# Patient Record
Sex: Female | Born: 1960
Health system: Southern US, Community
[De-identification: ages and names within clinical notes are randomized; demographics above are authoritative.]

## PROBLEM LIST (undated history)

## (undated) DIAGNOSIS — E7800 Pure hypercholesterolemia, unspecified: Secondary | ICD-10-CM

## (undated) DIAGNOSIS — D259 Leiomyoma of uterus, unspecified: Secondary | ICD-10-CM

## (undated) DIAGNOSIS — N750 Cyst of Bartholin's gland: Secondary | ICD-10-CM

## (undated) DIAGNOSIS — N159 Renal tubulo-interstitial disease, unspecified: Secondary | ICD-10-CM

## (undated) DIAGNOSIS — K219 Gastro-esophageal reflux disease without esophagitis: Secondary | ICD-10-CM

## (undated) DIAGNOSIS — G43909 Migraine, unspecified, not intractable, without status migrainosus: Secondary | ICD-10-CM

## (undated) DIAGNOSIS — M4800 Spinal stenosis, site unspecified: Secondary | ICD-10-CM

## (undated) DIAGNOSIS — T8859XA Other complications of anesthesia, initial encounter: Secondary | ICD-10-CM

## (undated) DIAGNOSIS — R569 Unspecified convulsions: Secondary | ICD-10-CM

## (undated) DIAGNOSIS — N200 Calculus of kidney: Secondary | ICD-10-CM

## (undated) DIAGNOSIS — E785 Hyperlipidemia, unspecified: Secondary | ICD-10-CM

## (undated) DIAGNOSIS — I10 Essential (primary) hypertension: Secondary | ICD-10-CM

## (undated) HISTORY — PX: EYE SURGERY: SHX253

## (undated) HISTORY — DX: Spinal stenosis, site unspecified: M48.00

## (undated) HISTORY — DX: Migraine, unspecified, not intractable, without status migrainosus: G43.909

## (undated) HISTORY — DX: Leiomyoma of uterus, unspecified: D25.9

## (undated) HISTORY — PX: ABDOMINAL HYSTERECTOMY: SHX81

## (undated) HISTORY — DX: Cyst of Bartholin's gland: N75.0

## (undated) HISTORY — DX: Hyperlipidemia, unspecified: E78.5

## (undated) HISTORY — PX: LITHOTRIPSY: SUR834

## (undated) HISTORY — DX: Renal tubulo-interstitial disease, unspecified: N15.9

## (undated) HISTORY — PX: OTHER SURGICAL HISTORY: SHX169

## (undated) HISTORY — DX: Calculus of kidney: N20.0

## (undated) HISTORY — PX: KNEE ARTHROSCOPY: SHX127

---

## 2000-02-07 ENCOUNTER — Encounter: Payer: Self-pay | Admitting: Emergency Medicine

## 2000-02-07 ENCOUNTER — Observation Stay (HOSPITAL_COMMUNITY): Admission: EM | Admit: 2000-02-07 | Discharge: 2000-02-08 | Payer: Self-pay | Admitting: Emergency Medicine

## 2000-02-07 ENCOUNTER — Encounter: Payer: Self-pay | Admitting: Otolaryngology

## 2000-04-09 ENCOUNTER — Encounter: Admission: RE | Admit: 2000-04-09 | Discharge: 2000-07-08 | Payer: Self-pay | Admitting: Otolaryngology

## 2000-10-22 ENCOUNTER — Ambulatory Visit (HOSPITAL_BASED_OUTPATIENT_CLINIC_OR_DEPARTMENT_OTHER): Admission: RE | Admit: 2000-10-22 | Discharge: 2000-10-22 | Payer: Self-pay | Admitting: Orthopedic Surgery

## 2001-07-20 ENCOUNTER — Emergency Department (HOSPITAL_COMMUNITY): Admission: EM | Admit: 2001-07-20 | Discharge: 2001-07-20 | Payer: Self-pay | Admitting: *Deleted

## 2001-08-05 ENCOUNTER — Encounter: Admission: RE | Admit: 2001-08-05 | Discharge: 2001-08-28 | Payer: Self-pay | Admitting: Orthopedic Surgery

## 2002-01-21 ENCOUNTER — Ambulatory Visit (HOSPITAL_COMMUNITY): Admission: RE | Admit: 2002-01-21 | Discharge: 2002-01-21 | Payer: Self-pay | Admitting: Family Medicine

## 2002-01-21 ENCOUNTER — Encounter: Payer: Self-pay | Admitting: Family Medicine

## 2004-05-23 ENCOUNTER — Ambulatory Visit (HOSPITAL_COMMUNITY): Admission: RE | Admit: 2004-05-23 | Discharge: 2004-05-23 | Payer: Self-pay | Admitting: Occupational Therapy

## 2004-08-01 ENCOUNTER — Ambulatory Visit: Payer: Self-pay | Admitting: *Deleted

## 2004-08-01 ENCOUNTER — Ambulatory Visit: Payer: Self-pay | Admitting: Family Medicine

## 2004-09-10 ENCOUNTER — Ambulatory Visit: Payer: Self-pay | Admitting: Family Medicine

## 2004-12-18 ENCOUNTER — Emergency Department (HOSPITAL_COMMUNITY): Admission: EM | Admit: 2004-12-18 | Discharge: 2004-12-18 | Payer: Self-pay | Admitting: Emergency Medicine

## 2004-12-21 ENCOUNTER — Ambulatory Visit: Payer: Self-pay | Admitting: Family Medicine

## 2005-01-08 ENCOUNTER — Ambulatory Visit: Payer: Self-pay | Admitting: Family Medicine

## 2005-03-06 ENCOUNTER — Ambulatory Visit: Payer: Self-pay | Admitting: Internal Medicine

## 2005-03-11 ENCOUNTER — Ambulatory Visit: Payer: Self-pay | Admitting: Internal Medicine

## 2005-06-25 ENCOUNTER — Ambulatory Visit: Payer: Self-pay | Admitting: Family Medicine

## 2005-07-28 ENCOUNTER — Emergency Department (HOSPITAL_COMMUNITY): Admission: EM | Admit: 2005-07-28 | Discharge: 2005-07-28 | Payer: Self-pay | Admitting: Emergency Medicine

## 2006-08-28 ENCOUNTER — Ambulatory Visit: Payer: Self-pay | Admitting: Family Medicine

## 2006-09-04 ENCOUNTER — Ambulatory Visit: Payer: Self-pay | Admitting: Family Medicine

## 2006-11-05 ENCOUNTER — Ambulatory Visit: Payer: Self-pay | Admitting: Family Medicine

## 2007-03-11 ENCOUNTER — Ambulatory Visit: Payer: Self-pay | Admitting: Family Medicine

## 2007-07-07 ENCOUNTER — Ambulatory Visit: Payer: Self-pay | Admitting: Internal Medicine

## 2007-08-05 ENCOUNTER — Encounter (INDEPENDENT_AMBULATORY_CARE_PROVIDER_SITE_OTHER): Payer: Self-pay | Admitting: *Deleted

## 2007-08-27 ENCOUNTER — Ambulatory Visit: Payer: Self-pay | Admitting: Internal Medicine

## 2007-08-27 LAB — CONVERTED CEMR LAB
Alkaline Phosphatase: 66 units/L (ref 39–117)
BUN: 16 mg/dL (ref 6–23)
CO2: 26 meq/L (ref 19–32)
Calcium: 9.2 mg/dL (ref 8.4–10.5)
Chloride: 102 meq/L (ref 96–112)
Eosinophils Absolute: 0.2 10*3/uL (ref 0.0–0.7)
Glucose, Bld: 80 mg/dL (ref 70–99)
HCT: 40.7 % (ref 36.0–46.0)
LDL Cholesterol: 139 mg/dL — ABNORMAL HIGH (ref 0–99)
Lymphocytes Relative: 36 % (ref 12–46)
Lymphs Abs: 3.4 10*3/uL — ABNORMAL HIGH (ref 0.7–3.3)
Monocytes Absolute: 0.5 10*3/uL (ref 0.2–0.7)
Platelets: 325 10*3/uL (ref 150–400)
RBC: 4.34 M/uL (ref 3.87–5.11)
Total Bilirubin: 0.4 mg/dL (ref 0.3–1.2)
Total CHOL/HDL Ratio: 4.6
VLDL: 20 mg/dL (ref 0–40)

## 2007-09-10 ENCOUNTER — Ambulatory Visit: Payer: Self-pay | Admitting: Internal Medicine

## 2007-09-15 ENCOUNTER — Ambulatory Visit (HOSPITAL_COMMUNITY): Admission: RE | Admit: 2007-09-15 | Discharge: 2007-09-15 | Payer: Self-pay | Admitting: Family Medicine

## 2007-09-15 ENCOUNTER — Ambulatory Visit: Payer: Self-pay | Admitting: Internal Medicine

## 2007-10-02 ENCOUNTER — Ambulatory Visit: Payer: Self-pay | Admitting: Internal Medicine

## 2007-12-03 ENCOUNTER — Ambulatory Visit: Payer: Self-pay | Admitting: Family Medicine

## 2007-12-03 ENCOUNTER — Encounter (INDEPENDENT_AMBULATORY_CARE_PROVIDER_SITE_OTHER): Payer: Self-pay | Admitting: Internal Medicine

## 2007-12-03 LAB — CONVERTED CEMR LAB
AST: 22 units/L (ref 0–37)
Albumin: 3.9 g/dL (ref 3.5–5.2)
CO2: 27 meq/L (ref 19–32)
Calcium: 9.2 mg/dL (ref 8.4–10.5)
Chloride: 107 meq/L (ref 96–112)
Cholesterol: 154 mg/dL (ref 0–200)
HDL: 46 mg/dL (ref >39)
LDL Cholesterol: 92 mg/dL (ref 0–99)
Potassium: 4.1 meq/L (ref 3.5–5.3)
Sodium: 143 meq/L (ref 135–145)
Total CHOL/HDL Ratio: 3.3
Triglycerides: 80 mg/dL (ref <150)

## 2007-12-24 ENCOUNTER — Ambulatory Visit: Payer: Self-pay | Admitting: Internal Medicine

## 2008-01-08 ENCOUNTER — Ambulatory Visit: Payer: Self-pay | Admitting: Internal Medicine

## 2008-01-08 ENCOUNTER — Encounter (INDEPENDENT_AMBULATORY_CARE_PROVIDER_SITE_OTHER): Payer: Self-pay | Admitting: Family Medicine

## 2008-01-08 LAB — CONVERTED CEMR LAB
Basophils Absolute: 0 10*3/uL (ref 0.0–0.1)
Eosinophils Absolute: 0.1 10*3/uL (ref 0.0–0.7)
Eosinophils Relative: 1 % (ref 0–5)
Lymphocytes Relative: 34 % (ref 12–46)
MCHC: 31.9 g/dL (ref 30.0–36.0)
MCV: 97.3 fL (ref 78.0–100.0)
Monocytes Relative: 6 % (ref 3–12)
Neutro Abs: 3.9 10*3/uL (ref 1.7–7.7)

## 2008-03-04 ENCOUNTER — Ambulatory Visit: Payer: Self-pay | Admitting: Internal Medicine

## 2008-03-16 ENCOUNTER — Ambulatory Visit: Payer: Self-pay | Admitting: Internal Medicine

## 2008-08-01 ENCOUNTER — Ambulatory Visit: Payer: Self-pay | Admitting: Internal Medicine

## 2008-11-29 ENCOUNTER — Ambulatory Visit: Payer: Self-pay | Admitting: Family Medicine

## 2009-01-05 ENCOUNTER — Ambulatory Visit: Payer: Self-pay | Admitting: Internal Medicine

## 2009-01-05 ENCOUNTER — Encounter (INDEPENDENT_AMBULATORY_CARE_PROVIDER_SITE_OTHER): Payer: Self-pay | Admitting: Adult Health

## 2009-01-05 LAB — CONVERTED CEMR LAB
AST: 22 units/L (ref 0–37)
Albumin: 4.2 g/dL (ref 3.5–5.2)
Alkaline Phosphatase: 65 units/L (ref 39–117)
BUN: 13 mg/dL (ref 6–23)
Basophils Absolute: 0 10*3/uL (ref 0.0–0.1)
Calcium: 9.4 mg/dL (ref 8.4–10.5)
Cholesterol: 146 mg/dL (ref 0–200)
Creatinine, Ser: 0.79 mg/dL (ref 0.40–1.20)
Glucose, Bld: 94 mg/dL (ref 70–99)
HCT: 40.5 % (ref 36.0–46.0)
Helicobacter Pylori Antibody-IgG: 5.3 — ABNORMAL HIGH
Lymphocytes Relative: 36 % (ref 12–46)
Lymphs Abs: 2.2 10*3/uL (ref 0.7–4.0)
MCHC: 32.6 g/dL (ref 30.0–36.0)
MCV: 95.3 fL (ref 78.0–100.0)
Neutrophils Relative %: 57 % (ref 43–77)
Platelets: 295 10*3/uL (ref 150–400)
RDW: 14.5 % (ref 11.5–15.5)
Sodium: 140 meq/L (ref 135–145)
Total Protein: 7.4 g/dL (ref 6.0–8.3)
Triglycerides: 50 mg/dL (ref <150)

## 2009-02-06 ENCOUNTER — Encounter (INDEPENDENT_AMBULATORY_CARE_PROVIDER_SITE_OTHER): Payer: Self-pay | Admitting: Adult Health

## 2009-02-06 ENCOUNTER — Ambulatory Visit: Payer: Self-pay | Admitting: Internal Medicine

## 2009-02-08 ENCOUNTER — Ambulatory Visit (HOSPITAL_COMMUNITY): Admission: RE | Admit: 2009-02-08 | Discharge: 2009-02-08 | Payer: Self-pay | Admitting: Family Medicine

## 2009-05-10 ENCOUNTER — Ambulatory Visit: Payer: Self-pay | Admitting: Internal Medicine

## 2009-07-07 ENCOUNTER — Ambulatory Visit: Payer: Self-pay | Admitting: Internal Medicine

## 2009-07-07 ENCOUNTER — Encounter (INDEPENDENT_AMBULATORY_CARE_PROVIDER_SITE_OTHER): Payer: Self-pay | Admitting: Adult Health

## 2009-07-07 ENCOUNTER — Ambulatory Visit (HOSPITAL_COMMUNITY): Admission: RE | Admit: 2009-07-07 | Discharge: 2009-07-07 | Payer: Self-pay | Admitting: Internal Medicine

## 2009-07-07 LAB — CONVERTED CEMR LAB: Microalb, Ur: 1.63 mg/dL (ref 0.00–1.89)

## 2009-07-28 ENCOUNTER — Ambulatory Visit: Payer: Self-pay | Admitting: Internal Medicine

## 2009-07-31 ENCOUNTER — Encounter (INDEPENDENT_AMBULATORY_CARE_PROVIDER_SITE_OTHER): Payer: Self-pay | Admitting: Adult Health

## 2009-07-31 ENCOUNTER — Ambulatory Visit: Payer: Self-pay | Admitting: Internal Medicine

## 2009-07-31 LAB — CONVERTED CEMR LAB
AST: 17 units/L (ref 0–37)
Albumin: 4.1 g/dL (ref 3.5–5.2)
BUN: 10 mg/dL (ref 6–23)
Calcium: 9.6 mg/dL (ref 8.4–10.5)
Cholesterol: 173 mg/dL (ref 0–200)
HDL: 42 mg/dL (ref >39)
Potassium: 4.3 meq/L (ref 3.5–5.3)
Sodium: 140 meq/L (ref 135–145)
Total CHOL/HDL Ratio: 4.1
Triglycerides: 79 mg/dL (ref <150)
VLDL: 16 mg/dL (ref 0–40)

## 2009-08-14 ENCOUNTER — Ambulatory Visit: Payer: Self-pay | Admitting: Internal Medicine

## 2009-08-22 ENCOUNTER — Ambulatory Visit: Payer: Self-pay | Admitting: Internal Medicine

## 2009-10-23 ENCOUNTER — Ambulatory Visit: Payer: Self-pay | Admitting: Internal Medicine

## 2009-10-23 ENCOUNTER — Encounter (INDEPENDENT_AMBULATORY_CARE_PROVIDER_SITE_OTHER): Payer: Self-pay | Admitting: Adult Health

## 2009-10-23 LAB — CONVERTED CEMR LAB
ALT: 13 units/L (ref 0–35)
AST: 21 units/L (ref 0–37)
Albumin: 4.2 g/dL (ref 3.5–5.2)
Alkaline Phosphatase: 60 units/L (ref 39–117)
BUN: 13 mg/dL (ref 6–23)
Calcium: 8.8 mg/dL (ref 8.4–10.5)
Creatinine, Ser: 0.83 mg/dL (ref 0.40–1.20)
Glucose, Bld: 83 mg/dL (ref 70–99)
LDL Cholesterol: 99 mg/dL (ref 0–99)
Potassium: 4.2 meq/L (ref 3.5–5.3)
Total Bilirubin: 0.5 mg/dL (ref 0.3–1.2)
Total CHOL/HDL Ratio: 3.6
Total Protein: 7.1 g/dL (ref 6.0–8.3)

## 2010-01-10 ENCOUNTER — Ambulatory Visit: Payer: Self-pay | Admitting: Internal Medicine

## 2010-01-29 ENCOUNTER — Observation Stay (HOSPITAL_COMMUNITY): Admission: EM | Admit: 2010-01-29 | Discharge: 2010-01-29 | Payer: Self-pay | Admitting: Emergency Medicine

## 2010-01-29 ENCOUNTER — Encounter (INDEPENDENT_AMBULATORY_CARE_PROVIDER_SITE_OTHER): Payer: Self-pay | Admitting: Internal Medicine

## 2010-02-02 ENCOUNTER — Ambulatory Visit: Payer: Self-pay | Admitting: Family Medicine

## 2010-02-02 ENCOUNTER — Encounter (INDEPENDENT_AMBULATORY_CARE_PROVIDER_SITE_OTHER): Payer: Self-pay | Admitting: Adult Health

## 2010-02-04 ENCOUNTER — Emergency Department (HOSPITAL_COMMUNITY): Admission: EM | Admit: 2010-02-04 | Discharge: 2010-02-04 | Payer: Self-pay | Admitting: Emergency Medicine

## 2010-03-02 ENCOUNTER — Ambulatory Visit: Payer: Self-pay | Admitting: Internal Medicine

## 2011-02-11 LAB — GLUCOSE, CAPILLARY: Glucose-Capillary: 115 mg/dL — ABNORMAL HIGH (ref 70–99)

## 2011-02-11 LAB — COMPREHENSIVE METABOLIC PANEL
AST: 24 U/L (ref 0–37)
Albumin: 3.5 g/dL (ref 3.5–5.2)
BUN: 10 mg/dL (ref 6–23)
CO2: 27 mEq/L (ref 19–32)
Calcium: 9.1 mg/dL (ref 8.4–10.5)
Creatinine, Ser: 0.75 mg/dL (ref 0.4–1.2)
GFR calc Af Amer: >60 mL/min (ref >60)
Sodium: 138 mEq/L (ref 135–145)
Total Protein: 6.8 g/dL (ref 6.0–8.3)

## 2011-02-11 LAB — CBC
Hemoglobin: 13.2 g/dL (ref 12.0–15.0)
MCHC: 32.6 g/dL (ref 30.0–36.0)
MCHC: 32.9 g/dL (ref 30.0–36.0)
MCV: 97 fL (ref 78.0–100.0)
Platelets: 269 10*3/uL (ref 150–400)
RBC: 3.98 MIL/uL (ref 3.87–5.11)

## 2011-02-11 LAB — POCT I-STAT, CHEM 8
BUN: 13 mg/dL (ref 6–23)
Chloride: 105 mEq/L (ref 96–112)
Glucose, Bld: 101 mg/dL — ABNORMAL HIGH (ref 70–99)
TCO2: 28 mmol/L (ref 0–100)

## 2011-02-11 LAB — CARDIAC PANEL(CRET KIN+CKTOT+MB+TROPI)
Relative Index: 0.7 (ref 0.0–2.5)
Troponin I: 0.02 ng/mL (ref 0.00–0.06)

## 2011-02-11 LAB — LIPID PANEL
Cholesterol: 154 mg/dL (ref 0–200)
HDL: 45 mg/dL (ref >39)
LDL Cholesterol: 96 mg/dL (ref 0–99)
Triglycerides: 63 mg/dL (ref <150)

## 2011-02-11 LAB — DIFFERENTIAL
Basophils Relative: 0 % (ref 0–1)
Lymphocytes Relative: 26 % (ref 12–46)
Lymphs Abs: 1.9 10*3/uL (ref 0.7–4.0)
Neutrophils Relative %: 68 % (ref 43–77)

## 2011-02-11 LAB — URINALYSIS, ROUTINE W REFLEX MICROSCOPIC
Hgb urine dipstick: NEGATIVE
Nitrite: NEGATIVE
Protein, ur: NEGATIVE mg/dL
Specific Gravity, Urine: 1.024 (ref 1.005–1.030)
Urobilinogen, UA: 1 mg/dL (ref 0.0–1.0)
pH: 6 (ref 5.0–8.0)

## 2011-02-11 LAB — URINE MICROSCOPIC-ADD ON

## 2011-02-11 LAB — POCT PREGNANCY, URINE: Preg Test, Ur: NEGATIVE

## 2011-03-21 ENCOUNTER — Other Ambulatory Visit: Payer: Self-pay | Admitting: Physician Assistant

## 2011-03-21 DIAGNOSIS — Z1231 Encounter for screening mammogram for malignant neoplasm of breast: Secondary | ICD-10-CM

## 2011-03-27 ENCOUNTER — Ambulatory Visit
Admission: RE | Admit: 2011-03-27 | Discharge: 2011-03-27 | Disposition: A | Discharge status: Home / Self Care | Payer: Federal, State, Local not specified - PPO | Source: Ambulatory Visit | Attending: Physician Assistant | Admitting: Physician Assistant

## 2011-03-27 DIAGNOSIS — Z1231 Encounter for screening mammogram for malignant neoplasm of breast: Secondary | ICD-10-CM

## 2011-04-05 NOTE — Op Note (Signed)
[place]. [facility]  Patient:    Hannah Nguyen, Hannah Nguyen                      MRN: [id number] Proc. Date: 10/22/00 Adm. Date:  [id number] Attending:  Milly Jakob                           Operative Report  PREOPERATIVE DIAGNOSIS:  Medial meniscal tear.  POSTOPERATIVE DIAGNOSES: 1. Medial meniscal tear. 2. Medial patellar facet chondromalacia.  PROCEDURE: 1. Partial medial meniscectomy. 2. Debridement of medial patellar facet chondromalacia.  SURGEON:  Harvie Junior, M.D.  ASSISTANT:  Currie Paris. Thedore Mins.  ANESTHESIA:  General.  BRIEF HISTORY:  She is a 50 year old female with a long history of having had locking and catching and grabbing in the knee.  She was seen initially at [facility], where an injection in the knee had initially resolved her pain, but then it returned.  She had intermittent sharp pain.  She was sent over for evaluation.  Her examination at that time showed that she had significant medial joint line tenderness with a positive McMurray.  She had ultimately been scoped previously with no significant findings, and she had talk of conservative care.  She ultimately wanted to have something done, and she presented to the operating room for this procedure.  DESCRIPTION OF PROCEDURE:  The patient was taken to the operating room and after adequate anesthesia was obtained, the patient was placed supine on the operating table.  The left leg was prepped and draped in the usual sterile fashion.  Following this, routine arthroscopic examination of the knee revealed that there was an obvious medial meniscal tear with a midbody problem and undersurface tearing.  This was in the midportion going posteriorly.  This could be easily distracted into the knee.  It was clear that this was locking and catching on her.  At this point, this undersurface tear was debrided back to a stable rim.  Attention was turned to the anterior cruciate, which  was normal.  The lateral side was without significant abnormality.  Attention was turned to the patellofemoral joint, where there was noted to be some lateral tracking, not dramatic but certainly there, and she also had some fairly significant chondromalacia on the medial patellar facet.  This was smoothed, and then attention was turned to the tracking. It was felt that this was reasonable tracking.  It did approach the midline at 30 degrees of flexion. at this point, the knee was copiously irrigated and suctioned dry.  One final check had been made prior to ensure there was no loose fragment or pieces.  A sterile compressive dressing was applied, and the patient taken to the recovery room, where she was noted to be in satisfactory condition.  Estimated blood loss for the procedure was none. DD:  10/22/00 TD:  10/22/00 Job: [id number]

## 2011-04-05 NOTE — Consult Note (Signed)
[place]. [facility]  Patient:    Hannah Nguyen, Hannah Nguyen                      MRN: [id number] Proc. Date: 02/07/00 Adm. Date:  [id number] Disc. Date: [id number] Attending:  Fernande Boyden CC:         Trudi Ida. Denton Lank, M.D.                          Consultation Report  CHIEF COMPLAINT:  Difficulty breathing.  HISTORY OF PRESENT ILLNESS:  A 50 year old black female presented to the emergency room early this morning with significant strider and difficulty breathing.  She was evaluated by Dr. Denton Lank, and treated with a racemic epinephrine treatment, with  slight response.  Then, ENT was called in consultation for further assistance. The patient has had trouble for the last two or three hours.  No obvious antecedent  problem.  She was exposed to some cigarette smoke last evening, which is unusual for her.  She had a prior similar event three weeks ago, walking down an isle in the grocery store, with no obvious exposure at that time.  She was treated with  prednisone and medicine, she claims, for reflux, which began with C, but which he cannot name.  This was only used for one week.  She was mildly better at the conclusion of that therapy.  Beginning three weeks ago and persisting up to the  present time, she has been hoarse.  There is no pain in her throat.  There is no dysphagia or aspiration.  She does have reflux regularly, and describes it coming all the way up into her pharynx.  She does not have history of allergies or sinus infections.  There is no internal or external throat manipulation or trauma. No voice abuse.  No neurologic problems.  She had a very mild episode similar, two or three years ago.  She does have one daughter who has asthma.  She has not had any prior evaluation for this.  Today, a lateral soft tissue neck and chest film were both read as normal.  PAST MEDICAL HISTORY:  The patient has no history of neurologic  problems, diabetes, hypertension, heart attack, or stroke.  No foreign body ingestion or trauma to he throat.  She does have reflux.  She has never had any prior surgeries.  She has no known medical allergies and takes no medicines currently.  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  She does not smoke.  She takes care of three children.  FAMILY HISTORY:  She has one daughter with asthma.  REVIEW OF SYSTEMS:  Noncontributory, except as above.  PHYSICAL EXAMINATION:  GENERAL:  This is an anxious middle-aged, black female in no distress.  Her voice is quite hoarse.  She has some audible inspiratory strider, but not true expiratory wheezing.  She does look intermittently, slightly dyspneic.  She is not warm to  touch and does not look overall sick.  HEENT:  The head is atraumatic.  The neck is supple.  Mental status is acute. he hears long conversational speech.  Ear canals are clear, with aerated drums of  normal configuration.  Cranial nerves intact.  Anterior nose, there is a mild right septal deviation, with a basically healthy mucosa on both sides.  Oral cavity reveals a narrow maxilla, with some drying of her lips.  She does have significant halitosis.  The teeth are in decent repair.  Normal moist membranes. Oropharynx is clear without erythema or exudate.  Small residual tonsils.  I could not easily see nasopharynx or hypopharynx, secondary to gag.  NECK:  Without adenopathy.  No tenderness.  Normal endolaryngeal crepitus.  No thyroid enlargement or tenderness.  Following Viscous Xylocaine topical anesthesia of the nose, the flexible laryngoscope was introduced.  The nasopharynx is clear, narrow.  There is a small, midline adenoid bud.  The oropharynx is clear.  Hypopharynx reveals a juvenile epiglottis.  The vocal cords are fully mobile.  There was some slight fusiform edema of both cords.  There was paradoxical motion with adduction  during inspiration.  This is not present uniformly.  No pooling in valleculae or piriformis.  No erythema.  There was a slight prominence in the anterior tracheal vault below the cords that I could not adequately assess.  I gave the patient a 1% plain Xylocaine hand-held nebulized breathing treatment to anesthetize the endolarynx.  During this, she sustained an episode of laryngospasm, which resolved with reassurance over a matter of a few seconds.  Following this, it was possible to pass a flexible scope through the vocal cords into the trachea proper and there was no significant irregularity, no lesions, no exudate, and no erythema.  It could not get all the way into the carina before inducing cough.   IMPRESSION:  Laryngospasm, probably secondary to reflux.  Chronic laryngitis.  PLAN:  I would like to admit her for 23-hour observation, put her on Prilosec twice daily, Xanax for anxiety, check a CT scan of her sinus, and also a barium swallow. I discussed her case with Dr. Shelle Iron, who is willing to consult more formally if it is felt necessary.  I have discussed all of this with the patient, who understands and agrees. DD:  02/07/00 TD:  02/07/00 Job: [id number]

## 2011-05-06 ENCOUNTER — Ambulatory Visit
Admission: RE | Admit: 2011-05-06 | Discharge: 2011-05-06 | Disposition: A | Discharge status: Home / Self Care | Payer: Federal, State, Local not specified - PPO | Source: Ambulatory Visit | Attending: Emergency Medicine | Admitting: Emergency Medicine

## 2011-05-06 ENCOUNTER — Other Ambulatory Visit: Payer: Self-pay | Admitting: Emergency Medicine

## 2011-05-06 DIAGNOSIS — R1032 Left lower quadrant pain: Secondary | ICD-10-CM

## 2011-05-06 DIAGNOSIS — R1031 Right lower quadrant pain: Secondary | ICD-10-CM

## 2011-05-06 MED ORDER — IOHEXOL 300 MG/ML  SOLN
100.0000 mL | Freq: Once | INTRAMUSCULAR | Status: AC | PRN
  Administered 2011-05-06: 100 mL via INTRAVENOUS

## 2011-06-13 ENCOUNTER — Ambulatory Visit (HOSPITAL_COMMUNITY)
Admission: RE | Admit: 2011-06-13 | Discharge: 2011-06-13 | Disposition: A | Discharge status: Home / Self Care | Payer: Federal, State, Local not specified - PPO | Source: Ambulatory Visit | Attending: Urology | Admitting: Urology

## 2011-06-13 ENCOUNTER — Ambulatory Visit (HOSPITAL_COMMUNITY): Payer: Federal, State, Local not specified - PPO

## 2011-06-13 DIAGNOSIS — Z79899 Other long term (current) drug therapy: Secondary | ICD-10-CM | POA: Insufficient documentation

## 2011-06-13 DIAGNOSIS — I10 Essential (primary) hypertension: Secondary | ICD-10-CM | POA: Insufficient documentation

## 2011-06-13 DIAGNOSIS — Z01818 Encounter for other preprocedural examination: Secondary | ICD-10-CM | POA: Insufficient documentation

## 2011-06-13 DIAGNOSIS — N200 Calculus of kidney: Secondary | ICD-10-CM | POA: Insufficient documentation

## 2011-10-22 ENCOUNTER — Encounter: Payer: Self-pay | Admitting: Obstetrics & Gynecology

## 2011-10-22 DIAGNOSIS — N750 Cyst of Bartholin's gland: Secondary | ICD-10-CM | POA: Diagnosis present

## 2011-10-22 DIAGNOSIS — N949 Unspecified condition associated with female genital organs and menstrual cycle: Secondary | ICD-10-CM | POA: Diagnosis present

## 2011-10-22 DIAGNOSIS — D259 Leiomyoma of uterus, unspecified: Secondary | ICD-10-CM | POA: Diagnosis present

## 2011-10-22 HISTORY — DX: Cyst of Bartholin's gland: N75.0

## 2011-10-22 HISTORY — DX: Leiomyoma of uterus, unspecified: D25.9

## 2011-10-22 NOTE — H&P (Signed)
  Subjective:  Hannah Nguyen is a 50 y.o. para 2, female.  There is a history of irregular bleeding.  Onset of symptoms was gradual starting a few years ago with gradually worsening course since that time. Bleeding is characterized as moderate.   Associated symptoms include pelvic pain.  Evaluation to date: pelvic ultrasound: positive for multiple uterine fibroids. Treatment to date: depo lupron therapy  Pertinent Gyn History:  Menses flow is moderate and irregular with dysmenorrhea Bleeding: dysfunctional uterine bleeding Contraception: tubal ligation DES exposure: denies Blood transfusions: none  Last pap: normal Date: 8/12 Patient Active Problem List  Diagnoses Date Noted  . Leiomyoma of uterus, unspecified 10/22/2011  . Unspecified symptom associated with female genital organs 10/22/2011  . Bartholin's gland cyst 10/22/2011   History reviewed. No pertinent past medical history.  History reviewed. No pertinent past surgical history.  No prescriptions prior to admission   Not on File  History  Substance Use Topics  . Smoking status: Not on file  . Smokeless tobacco: Not on file  . Alcohol Use: Not on file    History reviewed. No pertinent family history.   Review of Systems Pertinent items are noted in HPI.    Objective:   Vital signs in last 24 hours:    General:   alert  Skin:   normal  HEENT:  PERRLA  Lungs:   clear to auscultation bilaterally  Heart:   regular rate and rhythm, S1, S2 normal, no murmur, click, rub or gallop  Breasts:   normal without suspicious masses, skin or nipple changes or axillary nodes  Abdomen:  soft, non-tender; bowel sounds normal; no masses,  no organomegaly  Pelvis:  External genitalia: Bartholin's mass Vaginal: normal mucosa without prolapse or lesions Cervix: normal appearance Adnexa: non palpable Uterus: enlarged and Approximately 10 weeks aggregate size                                       Assessment/Plan:  Postmenopausal with continued, irregular bleeding.   Fibroid uterus.  Bartholin's gland cyst. Attempted TRH, possible TAH, marsupialization and biopsy of the left Bartholin's gland planned.    I had a lengthy discussion with the patient regarding her bleeding and consideration for hysterectomy.  Procedure, risks, reasons, benefits and complications (including injury to bowel, bladder, major blood vessel, ureter, bleeding, possibility of transfusion, infection, or fistula formation) were reviewed in detail. Consent was signed and preop testing was ordered.  Instructions were reviewed, including NPO after midnight.

## 2011-10-23 ENCOUNTER — Encounter (HOSPITAL_COMMUNITY): Payer: Self-pay

## 2011-10-23 ENCOUNTER — Encounter (HOSPITAL_COMMUNITY): Payer: Self-pay | Admitting: Pharmacy Technician

## 2011-10-23 ENCOUNTER — Encounter (HOSPITAL_COMMUNITY)
Admission: RE | Admit: 2011-10-23 | Discharge: 2011-10-23 | Disposition: A | Discharge status: Home / Self Care | Payer: Federal, State, Local not specified - PPO | Source: Ambulatory Visit | Attending: Obstetrics & Gynecology | Admitting: Obstetrics & Gynecology

## 2011-10-23 ENCOUNTER — Other Ambulatory Visit: Payer: Self-pay

## 2011-10-23 ENCOUNTER — Ambulatory Visit (HOSPITAL_COMMUNITY)
Admission: RE | Admit: 2011-10-23 | Discharge: 2011-10-23 | Disposition: A | Discharge status: Home / Self Care | Payer: Federal, State, Local not specified - PPO | Source: Ambulatory Visit | Attending: Obstetrics & Gynecology | Admitting: Obstetrics & Gynecology

## 2011-10-23 DIAGNOSIS — Z01818 Encounter for other preprocedural examination: Secondary | ICD-10-CM | POA: Insufficient documentation

## 2011-10-23 DIAGNOSIS — Z01812 Encounter for preprocedural laboratory examination: Secondary | ICD-10-CM | POA: Insufficient documentation

## 2011-10-23 DIAGNOSIS — Z01810 Encounter for preprocedural cardiovascular examination: Secondary | ICD-10-CM | POA: Insufficient documentation

## 2011-10-23 HISTORY — DX: Gastro-esophageal reflux disease without esophagitis: K21.9

## 2011-10-23 HISTORY — DX: Pure hypercholesterolemia, unspecified: E78.00

## 2011-10-23 HISTORY — DX: Essential (primary) hypertension: I10

## 2011-10-23 HISTORY — DX: Unspecified convulsions: R56.9

## 2011-10-23 LAB — BASIC METABOLIC PANEL
BUN: 13 mg/dL (ref 6–23)
CO2: 29 mEq/L (ref 19–32)
Calcium: 10.1 mg/dL (ref 8.4–10.5)
Creatinine, Ser: 0.84 mg/dL (ref 0.50–1.10)
Glucose, Bld: 91 mg/dL (ref 70–99)
Sodium: 141 mEq/L (ref 135–145)

## 2011-10-23 LAB — CBC
HCT: 37.8 % (ref 36.0–46.0)
Hemoglobin: 12.4 g/dL (ref 12.0–15.0)
MCH: 29.5 pg (ref 26.0–34.0)
MCV: 90 fL (ref 78.0–100.0)
RBC: 4.2 MIL/uL (ref 3.87–5.11)

## 2011-10-23 LAB — SURGICAL PCR SCREEN: Staphylococcus aureus: NEGATIVE

## 2011-10-23 NOTE — Patient Instructions (Addendum)
20 Hannah Nguyen  10/23/2011   Your procedure is scheduled on:  10/25/11  Report to [facility] at 5:15 AM.  Call this number if you have problems the morning of surgery: [phone]   Remember:   Do not eat food:After Midnight.  May have clear liquids:until Midnight .  Clear liquids include soda, tea, black coffee, apple or grape juice, broth.  Take these medicines the morning of surgery with A SIP OF WATER: KEPRA   Do not wear jewelry, make-up or nail polish.  Do not wear lotions, powders, or perfumes. You may wear deodorant.  Do not shave 48 hours prior to surgery.  Do not bring valuables to the hospital.  Contacts, dentures or bridgework may not be worn into surgery.  Leave suitcase in the car. After surgery it may be brought to your room.  For patients admitted to the hospital, checkout time is 11:00 AM the day of discharge.   Patients discharged the day of surgery will not be allowed to drive home.  Name and phone number of your driver:   Special Instructions: CHG Shower Use Special Wash: 1/2 bottle night before surgery and 1/2 bottle morning of surgery.   Please read over the following fact sheets that you were given: MRSA Information

## 2011-10-25 ENCOUNTER — Other Ambulatory Visit: Payer: Self-pay | Admitting: Obstetrics & Gynecology

## 2011-10-25 ENCOUNTER — Encounter (HOSPITAL_COMMUNITY): Payer: Self-pay | Admitting: *Deleted

## 2011-10-25 ENCOUNTER — Encounter (HOSPITAL_COMMUNITY)
Admission: RE | Disposition: A | Discharge status: Home / Self Care | Payer: Self-pay | Source: Ambulatory Visit | Attending: Obstetrics & Gynecology

## 2011-10-25 ENCOUNTER — Encounter (HOSPITAL_COMMUNITY): Payer: Self-pay | Admitting: Anesthesiology

## 2011-10-25 ENCOUNTER — Ambulatory Visit (HOSPITAL_COMMUNITY)
Admission: RE | Admit: 2011-10-25 | Discharge: 2011-10-26 | DRG: 353 | Disposition: A | Discharge status: Home / Self Care | Payer: Federal, State, Local not specified - PPO | Source: Ambulatory Visit | Attending: Obstetrics & Gynecology | Admitting: Obstetrics & Gynecology

## 2011-10-25 ENCOUNTER — Encounter (HOSPITAL_COMMUNITY): Payer: Self-pay | Admitting: Obstetrics & Gynecology

## 2011-10-25 ENCOUNTER — Ambulatory Visit (HOSPITAL_COMMUNITY): Payer: Federal, State, Local not specified - PPO | Admitting: Anesthesiology

## 2011-10-25 DIAGNOSIS — N750 Cyst of Bartholin's gland: Secondary | ICD-10-CM | POA: Diagnosis present

## 2011-10-25 DIAGNOSIS — N949 Unspecified condition associated with female genital organs and menstrual cycle: Secondary | ICD-10-CM | POA: Diagnosis present

## 2011-10-25 DIAGNOSIS — I10 Essential (primary) hypertension: Secondary | ICD-10-CM | POA: Insufficient documentation

## 2011-10-25 DIAGNOSIS — K219 Gastro-esophageal reflux disease without esophagitis: Secondary | ICD-10-CM | POA: Insufficient documentation

## 2011-10-25 DIAGNOSIS — D259 Leiomyoma of uterus, unspecified: Secondary | ICD-10-CM | POA: Diagnosis present

## 2011-10-25 HISTORY — PX: ROBOTIC ASSISTED LAP VAGINAL HYSTERECTOMY: SHX2362

## 2011-10-25 LAB — TYPE AND SCREEN: Antibody Screen: NEGATIVE

## 2011-10-25 SURGERY — ROBOTIC ASSISTED LAPAROSCOPIC VAGINAL HYSTERECTOMY
Anesthesia: General | Site: Abdomen | Wound class: Clean Contaminated

## 2011-10-25 MED ORDER — ZOLPIDEM TARTRATE 5 MG PO TABS: 5.0000 mg | ORAL_TABLET | Freq: Every evening | ORAL | Status: DC | PRN

## 2011-10-25 MED ORDER — GLYCOPYRROLATE 0.2 MG/ML IJ SOLN
INTRAMUSCULAR | Status: DC | PRN
  Administered 2011-10-25: .6 mg via INTRAVENOUS

## 2011-10-25 MED ORDER — SUCCINYLCHOLINE CHLORIDE 20 MG/ML IJ SOLN
INTRAMUSCULAR | Status: DC | PRN
  Administered 2011-10-25: 100 mg via INTRAVENOUS

## 2011-10-25 MED ORDER — KCL IN DEXTROSE-NACL 20-5-0.45 MEQ/L-%-% IV SOLN
INTRAVENOUS | Status: DC
  Administered 2011-10-25 (×2): via INTRAVENOUS
  Filled 2011-10-25 (×7): qty 1000

## 2011-10-25 MED ORDER — FENTANYL CITRATE 0.05 MG/ML IJ SOLN
INTRAMUSCULAR | Status: DC | PRN
  Administered 2011-10-25 (×6): 50 ug via INTRAVENOUS

## 2011-10-25 MED ORDER — HYDROMORPHONE HCL PF 1 MG/ML IJ SOLN
INTRAMUSCULAR | Status: DC | PRN
  Administered 2011-10-25 (×3): 0.5 mg via INTRAVENOUS

## 2011-10-25 MED ORDER — ROCURONIUM BROMIDE 100 MG/10ML IV SOLN
INTRAVENOUS | Status: DC | PRN
  Administered 2011-10-25: 40 mg via INTRAVENOUS
  Administered 2011-10-25 (×2): 10 mg via INTRAVENOUS

## 2011-10-25 MED ORDER — LIDOCAINE HCL (CARDIAC) 20 MG/ML IV SOLN
INTRAVENOUS | Status: DC | PRN
  Administered 2011-10-25: 80 mg via INTRAVENOUS

## 2011-10-25 MED ORDER — LACTATED RINGERS IV SOLN: INTRAVENOUS | Status: DC

## 2011-10-25 MED ORDER — ONDANSETRON HCL 4 MG PO TABS: 4.0000 mg | ORAL_TABLET | Freq: Four times a day (QID) | ORAL | Status: DC | PRN

## 2011-10-25 MED ORDER — BUPIVACAINE HCL (PF) 0.25 % IJ SOLN
INTRAMUSCULAR | Status: DC | PRN
  Administered 2011-10-25: 12 mL

## 2011-10-25 MED ORDER — HYDROCHLOROTHIAZIDE 25 MG PO TABS
25.0000 mg | ORAL_TABLET | Freq: Every day | ORAL | Status: DC
  Administered 2011-10-25: 25 mg via ORAL
  Filled 2011-10-25 (×4): qty 1

## 2011-10-25 MED ORDER — ACETAMINOPHEN 10 MG/ML IV SOLN
INTRAVENOUS | Status: DC | PRN
  Administered 2011-10-25: 1000 mg via INTRAVENOUS

## 2011-10-25 MED ORDER — PROMETHAZINE HCL 25 MG/ML IJ SOLN
6.2500 mg | INTRAMUSCULAR | Status: DC | PRN
  Administered 2011-10-25: 6.25 mg via INTRAVENOUS

## 2011-10-25 MED ORDER — ONDANSETRON HCL 4 MG/2ML IJ SOLN
INTRAMUSCULAR | Status: DC | PRN
  Administered 2011-10-25: 4 mg via INTRAVENOUS

## 2011-10-25 MED ORDER — CEFAZOLIN SODIUM-DEXTROSE 2-3 GM-% IV SOLR
2.0000 g | Freq: Once | INTRAVENOUS | Status: AC
  Administered 2011-10-25: 2 g via INTRAVENOUS
  Filled 2011-10-25: qty 50

## 2011-10-25 MED ORDER — NEOSTIGMINE METHYLSULFATE 1 MG/ML IJ SOLN
INTRAMUSCULAR | Status: DC | PRN
  Administered 2011-10-25: 4 mg via INTRAVENOUS

## 2011-10-25 MED ORDER — MORPHINE SULFATE 2 MG/ML IJ SOLN
2.0000 mg | INTRAMUSCULAR | Status: DC | PRN
  Administered 2011-10-25 – 2011-10-26 (×3): 2 mg via INTRAVENOUS
  Filled 2011-10-25 (×3): qty 1

## 2011-10-25 MED ORDER — LABETALOL HCL 5 MG/ML IV SOLN
INTRAVENOUS | Status: DC | PRN
  Administered 2011-10-25: 2.5 mg via INTRAVENOUS

## 2011-10-25 MED ORDER — LEVETIRACETAM 500 MG PO TABS
500.0000 mg | ORAL_TABLET | Freq: Every day | ORAL | Status: DC
  Administered 2011-10-25: 500 mg via ORAL
  Filled 2011-10-25 (×4): qty 1

## 2011-10-25 MED ORDER — LACTATED RINGERS IV SOLN
INTRAVENOUS | Status: DC | PRN
  Administered 2011-10-25 (×2): via INTRAVENOUS

## 2011-10-25 MED ORDER — MEPERIDINE HCL 25 MG/ML IJ SOLN: 6.2500 mg | INTRAMUSCULAR | Status: DC | PRN

## 2011-10-25 MED ORDER — HYDROMORPHONE HCL PF 1 MG/ML IJ SOLN
0.2500 mg | INTRAMUSCULAR | Status: DC | PRN
  Administered 2011-10-25 (×2): 0.5 mg via INTRAVENOUS

## 2011-10-25 MED ORDER — LISINOPRIL 20 MG PO TABS
20.0000 mg | ORAL_TABLET | Freq: Every day | ORAL | Status: DC
  Administered 2011-10-25: 20 mg via ORAL
  Filled 2011-10-25 (×4): qty 1

## 2011-10-25 MED ORDER — LISINOPRIL-HYDROCHLOROTHIAZIDE 20-25 MG PO TABS: 1.0000 | ORAL_TABLET | Freq: Every day | ORAL | Status: DC

## 2011-10-25 MED ORDER — DEXAMETHASONE SODIUM PHOSPHATE 10 MG/ML IJ SOLN
INTRAMUSCULAR | Status: DC | PRN
  Administered 2011-10-25: 10 mg via INTRAVENOUS

## 2011-10-25 MED ORDER — OXYCODONE-ACETAMINOPHEN 5-325 MG PO TABS: 1.0000 | ORAL_TABLET | ORAL | Status: DC | PRN

## 2011-10-25 MED ORDER — PROPOFOL 10 MG/ML IV EMUL
INTRAVENOUS | Status: DC | PRN
  Administered 2011-10-25: 200 mg via INTRAVENOUS

## 2011-10-25 MED ORDER — MIDAZOLAM HCL 5 MG/5ML IJ SOLN
INTRAMUSCULAR | Status: DC | PRN
  Administered 2011-10-25: 2 mg via INTRAVENOUS

## 2011-10-25 MED ORDER — ONDANSETRON HCL 4 MG/2ML IJ SOLN
4.0000 mg | Freq: Four times a day (QID) | INTRAMUSCULAR | Status: DC | PRN
  Administered 2011-10-25: 4 mg via INTRAVENOUS
  Filled 2011-10-25: qty 2

## 2011-10-25 MED ORDER — LEVETIRACETAM 250 MG PO TABS
250.0000 mg | ORAL_TABLET | Freq: Every day | ORAL | Status: DC
  Administered 2011-10-25 – 2011-10-26 (×2): 250 mg via ORAL
  Filled 2011-10-25 (×3): qty 1

## 2011-10-25 MED ORDER — LACTATED RINGERS IR SOLN
Status: DC | PRN
  Administered 2011-10-25: 1000 mL

## 2011-10-25 SURGICAL SUPPLY — 46 items
ADH SKN CLS APL DERMABOND .7 (GAUZE/BANDAGES/DRESSINGS) ×3
CHLORAPREP W/TINT 26ML (MISCELLANEOUS) ×4 IMPLANT
CLOTH BEACON ORANGE TIMEOUT ST (SAFETY) ×4 IMPLANT
CORD HIGH FREQUENCY UNIPOLAR (ELECTROSURGICAL) ×3 IMPLANT
CORDS BIPOLAR (ELECTRODE) ×4 IMPLANT
COVER SURGICAL LIGHT HANDLE (MISCELLANEOUS) ×4 IMPLANT
COVER TIP SHEARS 8 DVNC (MISCELLANEOUS) ×3 IMPLANT
COVER TIP SHEARS 8MM DA VINCI (MISCELLANEOUS) ×1
DECANTER SPIKE VIAL GLASS SM (MISCELLANEOUS) ×1 IMPLANT
DERMABOND ADVANCED (GAUZE/BANDAGES/DRESSINGS) ×1
DERMABOND ADVANCED .7 DNX12 (GAUZE/BANDAGES/DRESSINGS) ×3 IMPLANT
DRAPE SURG IRRIG POUCH 19X23 (DRAPES) ×4 IMPLANT
DRSG TEGADERM 6X8 (GAUZE/BANDAGES/DRESSINGS) ×9 IMPLANT
ELECT REM PT RETURN 9FT ADLT (ELECTROSURGICAL) ×4
ELECTRODE REM PT RTRN 9FT ADLT (ELECTROSURGICAL) ×3 IMPLANT
FILTER SMOKE EVAC LAPAROSHD (FILTER) IMPLANT
GAUZE PACKING IODOFORM 1 (PACKING) ×3 IMPLANT
GAUZE VASELINE 3X9 (GAUZE/BANDAGES/DRESSINGS) IMPLANT
GLOVE BIO SURGEON STRL SZ 6.5 (GLOVE) ×8 IMPLANT
GLOVE BIO SURGEON STRL SZ7 (GLOVE) ×4 IMPLANT
GLOVE BIO SURGEON STRL SZ7.5 (GLOVE) ×2 IMPLANT
GLOVE BIO SURGEON STRL SZ8 (GLOVE) ×4 IMPLANT
GLOVE BIOGEL M 6.5 STRL (GLOVE) ×3 IMPLANT
HOLDER FOLEY CATH W/STRAP (MISCELLANEOUS) ×4 IMPLANT
KIT ACCESSORY DA VINCI DISP (KITS) ×1
KIT ACCESSORY DVNC DISP (KITS) ×3 IMPLANT
MANIPULATOR UTERINE 4.5 ZUMI (MISCELLANEOUS) ×3 IMPLANT
OCCLUDER COLPOPNEUMO (BALLOONS) ×4 IMPLANT
PACK ROBOTIC CUSTOM GYN (CUSTOM PROCEDURE TRAY) ×4 IMPLANT
SET TUBE IRRIG SUCTION NO TIP (IRRIGATION / IRRIGATOR) ×4 IMPLANT
SOLUTION ELECTROLUBE (MISCELLANEOUS) ×3 IMPLANT
SPONGE GAUZE 4X4 12PLY (GAUZE/BANDAGES/DRESSINGS) ×3 IMPLANT
SPONGE LAP 18X18 X RAY DECT (DISPOSABLE) IMPLANT
SUT MNCRL AB 4-0 PS2 18 (SUTURE) ×8 IMPLANT
SUT VIC AB 0 CT1 27 (SUTURE) ×12
SUT VIC AB 0 CT1 27XBRD ANTBC (SUTURE) ×9 IMPLANT
SUT VIC AB 2-0 SH 27 (SUTURE) ×12
SUT VIC AB 2-0 SH 27X BRD (SUTURE) ×6 IMPLANT
SUT VICRYL 0 UR6 27IN ABS (SUTURE) ×8 IMPLANT
SYR BULB IRRIGATION 50ML (SYRINGE) ×4 IMPLANT
SYRINGE 10CC LL (SYRINGE) ×3 IMPLANT
TROCAR 12M 150ML BLUNT (TROCAR) ×6 IMPLANT
TROCAR BLADELESS OPT 5 75 (ENDOMECHANICALS) ×7 IMPLANT
TROCAR XCEL 12X100 BLDLESS (ENDOMECHANICALS) ×3 IMPLANT
TUBING FILTER THERMOFLATOR (ELECTROSURGICAL) ×4 IMPLANT
WATER STERILE IRR 1500ML POUR (IV SOLUTION) ×8 IMPLANT

## 2011-10-25 NOTE — Op Note (Signed)
Pre-operative Diagnosis: fibroids and Left-sided Bartholin's Gland Cyst  Post-operative Diagnosis: same  Operation: Robotic-assisted hysterectomy with bilateral salpingectomy  Surgeon: Roseanna Rainbow  Assistant: Coral Ceo, MD  Anesthesia: GET  Urine Output: per Anesthesiology  Findings: Small fibroid uterus, left-sided Bartholin's gland cyst--2 cm  Estimated Blood Loss:  Minimal                 Total IV Fluids: per Anesthesiology         Specimens: PATHOLOGY               Complications:  None; patient tolerated the procedure well.         Disposition: PACU - hemodynamically stable.         Condition: Stable    Procedure Details  The patient was seen in the Holding Room. The risks, benefits, complications, treatment options, and expected outcomes were discussed with the patient.  The patient concurred with the proposed plan, giving informed consent.  The site of surgery properly noted/marked. The patient was identified as Hannah Nguyen and the procedure verified as a Robotic-assisted hysterectomy, marsupialization of the left Bartholin's gland cyst. A Time Out was held and the above information confirmed.  After induction of anesthesia, the patient was draped and prepped in the usual sterile manner. Pt was placed in supine position after anesthesia and draped and prepped in the usual sterile manner. The abdominal Nguyen was placed after the CholoraPrep had been allowed to dry for 3 minutes.  Her arms were tucked to her side with all appropriate precautions.  The shoulder blocks were placed in the usual fashion.  The patient was placed in the semi-lithotomy position in [place] stirrups.  The perineum was prepped with Betadine.  Foley catheter was placed.  A sterile speculum was placed in the vagina.  The cervix was grasped with a single-tooth tenaculum and dilated with Shawnie Pons dilators.  The ZUMI uterine manipulator with a medium colpotomizer ring was placed without  difficulty.  A pneum occluder balloon was placed over the manipulator.   OG tube placement was confirmed and to suction.  Approximately 2 cm below the costal margin, in the midclavicular line the skin was anesthestized with 0.25% Marcaine.  A 5 mm incision was made and using a 5 mm Optiview, a 5 mm trocar was placed under direct vision.  The patient's abdomen was insufflated with CO2 gas.  At this point and all points during the procedure, the patient's intra-abdominal pressure did not exceed 15 mmHg.  A 10-12 camera port was place 24 cm above the pubic symphysis.  Bilateral 8 mm ports were place 10 cm and 15 degrees inferior.  All ports were placed under direct visualization.  The 5 mm port was removed.  The incision was extended to accommodate a 10 mm trocar.  A 10 mm trocar and sleeve were advanced under direct visualization.   The robot was docked in the usual fashion.  The round ligament on the right was transected with monopolar cautery.  The anterior and posterior leaves of the broad ligament were opened.  The ureter was identified.  A window was made between the infundibulopelvic ligament and the ureter.  The fallopian tube/utero-ovarian ligament was coagulated with bipolar cautery and transected.  The uterine vessels were skeletonized to the level of the Koh ring.  A C-loop was created in the usual fashion.  A similar procedure was performed on the patient's left side. The round ligament on the left was transected with monopolar  cautery.  The anterior and posterior leaves of the broad ligament were opened.  The ureter was identified.  A window was made between the infundibulopelvic ligament and the ureter.  The fallopian tube/utero-ovarian ligament was coagulated with bipolar cautery and transected.  The uterine vessels were skeletonized to the level of the Koh ring.  A C-loop was created in the usual fashion.   The bladder flap was completed.  At this time, the pneum occluder balloon was insufflated and a  colpotomy was performed.  The uterus, cervix, bilateral tubes and ovaries were delivered through the vagina.  All pedicles were inspected under low intraabdominal pressures and noted to be hemostatic.  The vagina cuff was closed using 0-Vicryl on a CT-1 needle in a running fashion.  The abdomen and pelvis were copiously irrigated.  The needle was removed without difficulty.  The instruments were then removed under direct visualization and the robotic ports removed.  The robot was undocked.  Deep, subcutaneous, figure-of-eight 0-Vicryl sutures on a UR-6 needle were placed in the 10-12 mm supraumbilical and infracostal incisions.  All skin incisions were closed in a subcuticular fashion using 3-0 Monocryl.  Steri-strips and Benzoin were applied.  The vagina was swabbed with minimal bleeding noted.  The vaginal mucosa overlying the left Bartholin's gland was infiltrated with 0.25% Marcaine.  An elliptical incision was made in the mucosa and the mucosa was excised.  The cyst wall was incised.  A mucous-like material extruded.  The cyst was probed with a hemostat.  The cyst wall was plicated to the vaginal mucosa with interrupted 2-0 vicryl sutures.  Adequate hemostasis was noted.   All instrument and needle counts were correct x  2.

## 2011-10-25 NOTE — Preoperative (Signed)
Beta Blockers   Reason not to administer Beta Blockers:Not Applicable 

## 2011-10-25 NOTE — Interval H&P Note (Signed)
History and Physical Interval Note:  10/25/2011 7:20 AM  Isabelle D Muise  has presented today for surgery, with the diagnosis of Uterine fibroids  The various methods of treatment have been discussed with the patient and family. After consideration of risks, benefits and other options for treatment, the patient has consented to  Procedure(s): ROBOTIC ASSISTED LAPAROSCOPIC VAGINAL HYSTERECTOMY ROBOTIC ASSISTED SALPINGO OOPHERECTOMY HYSTERECTOMY ABDOMINAL as a surgical intervention .  The patients' history has been reviewed, patient examined, no change in status, stable for surgery.  I have reviewed the patients' chart and labs.  Questions were answered to the patient's satisfaction.     JACKSON-MOORE,Seona Clemenson A

## 2011-10-25 NOTE — Anesthesia Postprocedure Evaluation (Signed)
  Anesthesia Post-op Note  Patient: Hannah Nguyen  Procedure(s) Performed:  ROBOTIC ASSISTED LAPAROSCOPIC VAGINAL HYSTERECTOMY - Marsupralization og bartholin gland, cyst biopsy  Patient Location: PACU  Anesthesia Type: General  Level of Consciousness: awake and alert   Airway and Oxygen Therapy: Patient Spontanous Breathing  Post-op Pain: mild  Post-op Assessment: Post-op Vital signs reviewed, Patient's Cardiovascular Status Stable, Respiratory Function Stable, Patent Airway and No signs of Nausea or vomiting  Post-op Vital Signs: stable  Complications: No apparent anesthesia complications

## 2011-10-25 NOTE — Anesthesia Preprocedure Evaluation (Addendum)
Anesthesia Evaluation    Airway Mallampati: I      Dental   Pulmonary          Cardiovascular hypertension, Pt. on medications     Neuro/Psych Seizures -,     GI/Hepatic GERD-  Controlled,  Endo/Other    Renal/GU      Musculoskeletal   Abdominal   Peds  Hematology   Anesthesia Other Findings   Reproductive/Obstetrics                          Anesthesia Physical Anesthesia Plan  ASA: II  Anesthesia Plan: General   Post-op Pain Management:    Induction: Intravenous  Airway Management Planned: Oral ETT  Additional Equipment:   Intra-op Plan:   Post-operative Plan: Extubation in OR  Informed Consent: I have reviewed the patients History and Physical, chart, labs and discussed the procedure including the risks, benefits and alternatives for the proposed anesthesia with the patient or authorized representative who has indicated his/her understanding and acceptance.   Dental advisory given  Plan Discussed with: CRNA  Anesthesia Plan Comments:         Anesthesia Quick Evaluation

## 2011-10-25 NOTE — Transfer of Care (Signed)
Immediate Anesthesia Transfer of Care Note  Patient: Hannah Nguyen  Procedure(s) Performed:  ROBOTIC ASSISTED LAPAROSCOPIC VAGINAL HYSTERECTOMY - Marsupralization og bartholin gland, cyst biopsy  Patient Location: PACU  Anesthesia Type: General  Level of Consciousness: awake, oriented and patient cooperative  Airway & Oxygen Therapy: Patient Spontanous Breathing and Patient connected to face mask oxygen  Post-op Assessment: Report given to PACU RN and Post -op Vital signs reviewed and stable  Post vital signs: Reviewed and stable  Complications: No apparent anesthesia complications

## 2011-10-26 LAB — BASIC METABOLIC PANEL
Chloride: 105 mEq/L (ref 96–112)
GFR calc Af Amer: >90 mL/min (ref >90)
GFR calc non Af Amer: >90 mL/min (ref >90)
Potassium: 3.7 mEq/L (ref 3.5–5.1)

## 2011-10-26 LAB — CBC
Platelets: 262 10*3/uL (ref 150–400)
RDW: 14.1 % (ref 11.5–15.5)
WBC: 14.7 10*3/uL — ABNORMAL HIGH (ref 4.0–10.5)

## 2011-10-26 MED ORDER — OXYCODONE-ACETAMINOPHEN 5-325 MG PO TABS
2.0000 | ORAL_TABLET | ORAL | Status: DC | PRN
  Administered 2011-10-26 (×2): 2 via ORAL
  Filled 2011-10-26 (×2): qty 2

## 2011-10-26 MED ORDER — OXYCODONE-ACETAMINOPHEN 5-325 MG PO TABS: 2.0000 | ORAL_TABLET | Freq: Four times a day (QID) | ORAL | Status: DC | PRN

## 2011-10-26 NOTE — Progress Notes (Signed)
1 Day Post-Op Procedure(s) (LRB): ROBOTIC ASSISTED LAPAROSCOPIC VAGINAL HYSTERECTOMY (N/A)  Subjective: Patient reports upper abdominal pain  Objective: Vital signs in last 24 hours: Temp:  [97.7 F (36.5 C)-98.9 F (37.2 C)] 97.7 F (36.5 C) (12/08 0952) Pulse Rate:  [80-96] 94  (12/08 0952) Resp:  [16-20] 18  (12/08 0952) BP: (99-161)/(56-94) 99/56 mmHg (12/08 0952) SpO2:  [95 %-100 %] 95 % (12/08 0952) Weight:  [77.565 kg (171 lb)] 171 lb (77.565 kg) (12/07 1300) Last BM Date: 10/24/11  Intake/Output from previous day: 12/07 0701 - 12/08 0700 In: 5700 [P.O.:1200; I.V.:4500] Out: 3975 [Urine:3875; Blood:100] Intake/Output this shift: Total I/O In: 240 [P.O.:240] Out: 250 [Urine:250]  Physical Examination:  General: alert GI: soft, non-tender; bowel sounds normal; no masses,  no organomegaly Extremities: extremities normal, atraumatic, no cyanosis or edema Incisions: C/D I  Labs:  WBC/Hgb/Hct/Plts:  14.7/13.3/39.7/262 (12/08 0349) BUN/Cr/glu/ALT/AST/amyl/lip:  7/0.66/--/--/--/--/-- (12/08 0349)  Assessment:  50 y.o. s/p Procedure(s): ROBOTIC ASSISTED LAPAROSCOPIC VAGINAL HYSTERECTOMY: stable  Pain: Pain is well-controlled on  oral medications.   CV:  Hypertension:  Blood pressures <120/80 after anesthesia GI:  Not taking much orally secondary to pain   Prophylaxis: intermittent pneumatic compression boots.  Plan: Advance diet Encourage ambulation Hold BP meds,  anticipate discharge home later today   LOS: 1 day     JACKSON-MOORE,Evianna Chandran A 10/26/2011, 12:36 PM

## 2011-10-26 NOTE — Discharge Summary (Signed)
Physician Discharge Summary  Patient ID: [name] MRN: [id number] DOB/AGE: 1961-10-11 50 y.o.  Admit date: 10/25/2011 Discharge date: 10/26/2011  Admission Diagnoses:  Active Problems:  Leiomyoma of uterus, unspecified  Unspecified symptom associated with female genital organs  Bartholin's gland cyst   Discharge Diagnoses:  Active Problems:  Leiomyoma of uterus, unspecified  Unspecified symptom associated with female genital organs  Bartholin's gland cyst   Discharged Condition: stable  Hospital Course: The patient underwent a TRH/marsupialization of the left Bartholin's gland.  Her postoperative course was uneventful.  Consults: none  Significant Diagnostic Studies: none  Treatments: surgery: see above  Discharge Exam: Blood pressure 99/56, pulse 94, temperature 97.7 F (36.5 C), temperature source Oral, resp. rate 18, height 5\' 5"  (1.651 m), weight 77.565 kg (171 lb), last menstrual period 09/22/2011, SpO2 95.00%. General appearance: alert GI: soft, non-tender; bowel sounds normal; no masses,  no organomegaly Extremities: extremities normal, atraumatic, no cyanosis or edema Incision/Wound: C/DI x 4  Disposition: Home or Self Care  Discharge Orders    Future Orders Please Complete By Expires   Diet - low sodium heart healthy      Activity as tolerated - No restrictions      Increase activity slowly      May walk up steps      May shower / Bathe      Comments:   No tub baths for 6 weeks   Driving Restrictions      Comments:   No driving for 1 weeks   Lifting restrictions      Comments:   No lifting > 30 lbs for 6 weeks   Sexual Activity Restrictions      Comments:   No intercourse for  8 weeks   Discharge wound care:      Comments:   Keep clean and dry   Call MD for:  temperature >100.4      Call MD for:  persistant nausea and vomiting      Call MD for:  severe uncontrolled pain      Call MD for:  redness, tenderness, or signs of infection  (pain, swelling, redness, odor or green/yellow discharge around incision site)      Call MD for:  persistant dizziness or light-headedness      Call MD for:  extreme fatigue        Current Discharge Medication List    START taking these medications   Details  oxyCODONE-acetaminophen (PERCOCET) 5-325 MG per tablet Take 2 tablets by mouth every 6 (six) hours as needed. Qty: 40 tablet, Refills: 0      CONTINUE these medications which have NOT CHANGED   Details  atorvastatin (LIPITOR) 20 MG tablet Take 20 mg by mouth at bedtime.      levETIRAcetam (KEPPRA) 500 MG tablet Take 250-500 mg by mouth every 12 (twelve) hours. 1 tablet at night and 0.5 tablet in the morning.     lisinopril-hydrochlorothiazide (PRINZIDE,ZESTORETIC) 20-25 MG per tablet Take 1 tablet by mouth at bedtime.      Multiple Vitamins-Minerals (MULTIVITAMINS THER. W/MINERALS) TABS Take 1 tablet by mouth daily.        STOP taking these medications     traMADol (ULTRAM) 50 MG tablet        Follow-up Information    Follow up with Roseanna Rainbow, MD. Make an appointment on 10/31/2011.   Contact information:   [address] [place] [address] [phone]  SignedAntionette Char A 10/26/2011, 12:56 PM

## 2011-10-26 NOTE — Progress Notes (Signed)
It was reported to me at shift change at 1900 that pt had an order for percocet for post-op pain.  I was told that pharmacy dc'd it because pt was allergic to vicodin.  Her reaction to vicodin was hives and itching.  Pharmacist explained to me that pt might have a similar reaction to percocet since they are related.  After talking with pt, she told me that as far as she know she has no allergy to percocet and was willing to try it.  After conferring with pharmacy, they decided to reenter the percocet order if I entered it in like it was a verbal order.  I entered the order for percocet as a verbal.

## 2011-10-28 ENCOUNTER — Encounter (HOSPITAL_COMMUNITY): Payer: Self-pay | Admitting: Obstetrics & Gynecology

## 2011-11-11 ENCOUNTER — Ambulatory Visit (HOSPITAL_COMMUNITY)
Admission: RE | Admit: 2011-11-11 | Discharge: 2011-11-11 | Disposition: A | Discharge status: Home / Self Care | Payer: Federal, State, Local not specified - PPO | Source: Ambulatory Visit | Attending: Obstetrics & Gynecology | Admitting: Obstetrics & Gynecology

## 2011-11-11 ENCOUNTER — Other Ambulatory Visit: Payer: Self-pay | Admitting: Obstetrics & Gynecology

## 2011-11-11 DIAGNOSIS — R109 Unspecified abdominal pain: Secondary | ICD-10-CM

## 2011-11-11 DIAGNOSIS — K5900 Constipation, unspecified: Secondary | ICD-10-CM | POA: Insufficient documentation

## 2011-11-11 DIAGNOSIS — Z90710 Acquired absence of both cervix and uterus: Secondary | ICD-10-CM | POA: Insufficient documentation

## 2012-01-16 ENCOUNTER — Ambulatory Visit (INDEPENDENT_AMBULATORY_CARE_PROVIDER_SITE_OTHER): Payer: Federal, State, Local not specified - PPO | Admitting: Physician Assistant

## 2012-01-16 ENCOUNTER — Encounter: Payer: Self-pay | Admitting: Physician Assistant

## 2012-01-16 VITALS — BP 112/71 | HR 77 | Temp 99.9°F | Resp 16 | Ht 65.5 in | Wt 177.5 lb

## 2012-01-16 DIAGNOSIS — E782 Mixed hyperlipidemia: Secondary | ICD-10-CM

## 2012-01-16 DIAGNOSIS — E785 Hyperlipidemia, unspecified: Secondary | ICD-10-CM

## 2012-01-16 DIAGNOSIS — I10 Essential (primary) hypertension: Secondary | ICD-10-CM

## 2012-01-16 DIAGNOSIS — Z79899 Other long term (current) drug therapy: Secondary | ICD-10-CM

## 2012-01-16 DIAGNOSIS — R569 Unspecified convulsions: Secondary | ICD-10-CM

## 2012-01-16 LAB — LIPID PANEL
Cholesterol: 139 mg/dL (ref 0–200)
HDL: 42 mg/dL (ref >39)
Total CHOL/HDL Ratio: 3.3 Ratio
Triglycerides: 61 mg/dL (ref <150)

## 2012-01-16 LAB — CBC WITH DIFFERENTIAL/PLATELET
Eosinophils Relative: 1 % (ref 0–5)
HCT: 39 % (ref 36.0–46.0)
Hemoglobin: 12.7 g/dL (ref 12.0–15.0)
Lymphocytes Relative: 37 % (ref 12–46)
Lymphs Abs: 2.4 10*3/uL (ref 0.7–4.0)
MCV: 90.5 fL (ref 78.0–100.0)
Monocytes Absolute: 0.4 10*3/uL (ref 0.1–1.0)
Monocytes Relative: 6 % (ref 3–12)
Neutro Abs: 3.7 10*3/uL (ref 1.7–7.7)
RDW: 14.3 % (ref 11.5–15.5)
WBC: 6.6 10*3/uL (ref 4.0–10.5)

## 2012-01-16 LAB — COMPREHENSIVE METABOLIC PANEL
AST: 25 U/L (ref 0–37)
Albumin: 3.7 g/dL (ref 3.5–5.2)
BUN: 11 mg/dL (ref 6–23)
CO2: 27 mEq/L (ref 19–32)
Calcium: 9.1 mg/dL (ref 8.4–10.5)
Chloride: 105 mEq/L (ref 96–112)
Creat: 0.72 mg/dL (ref 0.50–1.10)
Glucose, Bld: 83 mg/dL (ref 70–99)
Potassium: 3.8 mEq/L (ref 3.5–5.3)

## 2012-01-16 NOTE — Patient Instructions (Signed)
If you have not heard from me about your lab results in 2 weeks, please call the office.

## 2012-01-16 NOTE — Progress Notes (Signed)
  Subjective:    Patient ID: Hannah Nguyen, female    DOB: 08/14/1961, 51 y.o.   MRN: [id number]  HPI Presents for labwork.  Patient has hyperlipidemia, treated with atorvastatin, HTN treated with lisinopril HCT and seizure d/o treated with Keppra.  She feels good.     Review of Systems No chest pain, SOB, HA, dizziness, vision change, N/V, diarrhea, dysuria, myalgias, arthralgias or rash.     Objective:   Physical Exam Vital signs noted. Well-developed, well nourished BF who is awake, alert and oriented, in NAD. HEENT: [place]/AT, PERRL, EOMI.  Sclera and conjunctiva are clear.  EAC are patent, TMs are normal in appearance. Nasal mucosa is pink and moist. OP is clear. Neck: supple, non-tender, no lymphadenopathey, thyromegaly. Heart: RRR, no murmur Lungs: CTA Abdomen: normo-active bowel sounds, supple, non-tender, no mass or organomegaly. Extremities: no cyanosis, clubbing or edema. Skin: warm and dry without rash.  CBC, CMET, lipids are pending.     Assessment & Plan:  1. HTN Controlled.  Continue current treatment.  2. Hyperlipidemia. Await labs.  Continue current treatment.  3. Hyperlipidemia. Continue follow-up with neurology.  4. High risk medications. Await labs.  Modify regimen if indicated.

## 2012-01-17 ENCOUNTER — Encounter: Payer: Self-pay | Admitting: Physician Assistant

## 2012-03-18 ENCOUNTER — Other Ambulatory Visit: Payer: Self-pay | Admitting: Physician Assistant

## 2012-03-18 DIAGNOSIS — Z1231 Encounter for screening mammogram for malignant neoplasm of breast: Secondary | ICD-10-CM

## 2012-04-11 ENCOUNTER — Other Ambulatory Visit: Payer: Self-pay | Admitting: Physician Assistant

## 2012-04-15 ENCOUNTER — Ambulatory Visit (HOSPITAL_COMMUNITY)
Admission: RE | Admit: 2012-04-15 | Discharge: 2012-04-15 | Disposition: A | Discharge status: Home / Self Care | Payer: Federal, State, Local not specified - PPO | Source: Ambulatory Visit | Attending: Physician Assistant | Admitting: Physician Assistant

## 2012-04-15 DIAGNOSIS — Z1231 Encounter for screening mammogram for malignant neoplasm of breast: Secondary | ICD-10-CM | POA: Insufficient documentation

## 2012-04-20 ENCOUNTER — Ambulatory Visit (INDEPENDENT_AMBULATORY_CARE_PROVIDER_SITE_OTHER): Payer: Federal, State, Local not specified - PPO | Admitting: Family Medicine

## 2012-04-20 VITALS — BP 123/74 | HR 84 | Temp 98.4°F | Resp 16 | Ht 65.0 in | Wt 188.2 lb

## 2012-04-20 DIAGNOSIS — J40 Bronchitis, not specified as acute or chronic: Secondary | ICD-10-CM

## 2012-04-20 DIAGNOSIS — R05 Cough: Secondary | ICD-10-CM

## 2012-04-20 DIAGNOSIS — R059 Cough, unspecified: Secondary | ICD-10-CM

## 2012-04-20 DIAGNOSIS — J309 Allergic rhinitis, unspecified: Secondary | ICD-10-CM

## 2012-04-20 MED ORDER — DOXYCYCLINE HYCLATE 100 MG PO CAPS: 100.0000 mg | ORAL_CAPSULE | Freq: Two times a day (BID) | ORAL | Status: AC

## 2012-04-20 MED ORDER — BENZONATATE 100 MG PO CAPS: 100.0000 mg | ORAL_CAPSULE | Freq: Three times a day (TID) | ORAL | Status: AC | PRN

## 2012-04-20 MED ORDER — LEVOCETIRIZINE DIHYDROCHLORIDE 5 MG PO TABS: 5.0000 mg | ORAL_TABLET | Freq: Every evening | ORAL | Status: DC

## 2012-04-20 NOTE — Progress Notes (Signed)
Patient Name: Hannah Nguyen Date of Birth: 1961/04/24 Medical Record Number: [id number] Gender: female Date of Encounter: 04/20/2012  History of Present Illness:  Hannah Nguyen is a 51 y.o. very pleasant female patient who presents with the following:  Here with a cough and ST for about 3 days.  Cough is productive of "green junk."  She is taking a lot of cough drops.  She has noted a "slight fever" up to near 99 degrees.  Some runny nose.  The cough makes her ST worse.  No stomach symptoms except for decreased appetitie.  She is pushing fluids She has a seizure disorder and takes keppra- last seizure about 6 months ago.  She gets hives from hydrocodone.  She would also like some xyzal- she feels that this helps her more than OTC zyrtec  Patient Active Problem List  Diagnoses  . Leiomyoma of uterus, unspecified  . Unspecified symptom associated with female genital organs  . Bartholin's gland cyst   Past Medical History  Diagnosis Date  . Hypertension   . Elevated cholesterol   . GERD (gastroesophageal reflux disease)   . Seizures     2 months ago was last episode of uncontrollable rt arm motions and passed out  . Nephrolithiasis    Past Surgical History  Procedure Date  . Lithotripsy   . Cesarean section     x2  . Knee arthroscopy     x2  . Thumb surg   . Robotic assisted lap vaginal hysterectomy 10/25/2011    Procedure: ROBOTIC ASSISTED LAPAROSCOPIC VAGINAL HYSTERECTOMY;  Surgeon: Roseanna Rainbow, MD;  Location: [facility];  Service: Gynecology;  Laterality: N/A;  Marsupralization og bartholin gland, cyst biopsy   History  Substance Use Topics  . Smoking status: Never Smoker   . Smokeless tobacco: Never Used  . Alcohol Use: No   No family history on file. Allergies  Allergen Reactions  . Vicodin (Hydrocodone-Acetaminophen) Hives and Itching    Medication list has been reviewed and updated.  Prior to Admission medications   Medication Sig Start Date End  Date Taking? Authorizing Provider  atorvastatin (LIPITOR) 20 MG tablet TAKE ONE TABLET BY MOUTH AT BEDTIME 04/11/12  Yes Marzella Schlein McClung, PA-C  cetirizine (ZYRTEC) 10 MG tablet Take 10 mg by mouth daily.   Yes Historical Provider, MD  levETIRAcetam (KEPPRA) 500 MG tablet Take 250-500 mg by mouth every 12 (twelve) hours. 1 tablet at night and 0.5 tablet in the morning.    Yes Historical Provider, MD  lisinopril-hydrochlorothiazide (PRINZIDE,ZESTORETIC) 20-25 MG per tablet TAKE ONE TABLET BY MOUTH IN THE MORNING 04/11/12  Yes Anders Simmonds, PA-C  Multiple Vitamins-Minerals (MULTIVITAMINS THER. W/MINERALS) TABS Take 1 tablet by mouth daily.     Yes Historical Provider, MD  Specialty Vitamins Products (MENOPAUSE SUPPORT PO) Take 2 capsules by mouth 2 (two) times daily.   Yes Historical Provider, MD    Review of Systems:  As per HPI- otherwise negative.   Physical Examination: Filed Vitals:   04/20/12 0946  BP: 123/74  Pulse: 84  Temp: 98.4 F (36.9 C)  Resp: 16   Filed Vitals:   04/20/12 0946  Height: 5\' 5"  (1.651 m)  Weight: 188 lb 3.2 oz (85.367 kg)   Body mass index is 31.32 kg/(m^2).  GEN: WDWN, NAD, Non-toxic, A & O x 3  Coughing a lot but does not appear acutely ill HEENT: Atraumatic, Normocephalic. Neck supple. No masses, No LAD.  TM and oropharynx wnl, PEERl Ears  and Nose: No external deformity. CV: RRR, No M/G/R. No JVD. No thrill. No extra heart sounds. PULM: CTA B, no wheezes, crackles, rhonchi. No retractions. No resp. distress. No accessory muscle use. ABD: S, NT, ND, +BS. No rebound. No HSM. EXTR: No c/c/e NEURO Normal gait.  PSYCH: Normally interactive. Conversant. Not depressed or anxious appearing.  Calm demeanor.    Assessment and Plan: 1. Cough  benzonatate (TESSALON) 100 MG capsule  2. Bronchitis  doxycycline (VIBRAMYCIN) 100 MG capsule  3. Allergic rhinitis  levocetirizine (XYZAL) 5 MG tablet   Treat as above with doxycycline and tessalon perles.  Also  rx xyzal per her request.  Patient (or parent if minor) instructed to return to clinic or call if not better in 2-3 day(s).  Sooner if worse.      Jackelyn Illingworth, MD 04/20/2012 10:21 AM

## 2012-07-22 ENCOUNTER — Ambulatory Visit (INDEPENDENT_AMBULATORY_CARE_PROVIDER_SITE_OTHER): Payer: Federal, State, Local not specified - PPO | Admitting: Family Medicine

## 2012-07-22 VITALS — BP 104/76 | HR 72 | Temp 98.5°F | Resp 14 | Ht 65.0 in | Wt 188.2 lb

## 2012-07-22 DIAGNOSIS — J45909 Unspecified asthma, uncomplicated: Secondary | ICD-10-CM

## 2012-07-22 DIAGNOSIS — J309 Allergic rhinitis, unspecified: Secondary | ICD-10-CM

## 2012-07-22 DIAGNOSIS — R05 Cough: Secondary | ICD-10-CM

## 2012-07-22 DIAGNOSIS — Z23 Encounter for immunization: Secondary | ICD-10-CM

## 2012-07-22 DIAGNOSIS — R059 Cough, unspecified: Secondary | ICD-10-CM

## 2012-07-22 MED ORDER — FLUTICASONE PROPIONATE 50 MCG/ACT NA SUSP: 2.0000 | Freq: Every day | NASAL | Status: DC

## 2012-07-22 MED ORDER — BENZONATATE 100 MG PO CAPS: 100.0000 mg | ORAL_CAPSULE | Freq: Three times a day (TID) | ORAL | Status: AC | PRN

## 2012-07-22 MED ORDER — ALBUTEROL SULFATE HFA 108 (90 BASE) MCG/ACT IN AERS: 2.0000 | INHALATION_SPRAY | Freq: Four times a day (QID) | RESPIRATORY_TRACT | Status: DC | PRN

## 2012-07-22 MED ORDER — DOXYCYCLINE HYCLATE 100 MG PO CAPS: 100.0000 mg | ORAL_CAPSULE | Freq: Two times a day (BID) | ORAL | Status: DC

## 2012-07-22 NOTE — Progress Notes (Signed)
[facility] [address] [place] [address] [phone]  Date:  07/22/2012   Name:  Hannah Nguyen   DOB:  [birth year]   MRN:  [id number]  PCP:  Lucilla Edin, MD    Chief Complaint: Bronchitis   History of Present Illness:  Hannah Nguyen is a 51 y.o. very pleasant female patient who presents with the following:  She was here in June with a productive cough and slight fever- she was treated with doxy and tessalon perles.  She did get well- however she got ill again after her carpet was cleaned at home over the last couple of days.    Never a smoker.  She is having a scratchy throat, slightly productive cough.  She was sent home from work due to her cough.  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    No fever.  She does have sneezing, no other nasal symptoms. No GI symptoms.   She has used her daughter's albuterol sometimes in the past for similar symptoms which does help.    Patient Active Problem List  Diagnosis  . Leiomyoma of uterus, unspecified  . Unspecified symptom associated with female genital organs  . Bartholin's gland cyst    Past Medical History  Diagnosis Date  . Hypertension   . Elevated cholesterol   . GERD (gastroesophageal reflux disease)   . Seizures     2 months ago was last episode of uncontrollable rt arm motions and passed out  . Nephrolithiasis     Past Surgical History  Procedure Date  . Lithotripsy   . Cesarean section     x2  . Knee arthroscopy     x2  . Thumb surg   . Robotic assisted lap vaginal hysterectomy 10/25/2011    Procedure: ROBOTIC ASSISTED LAPAROSCOPIC VAGINAL HYSTERECTOMY;  Surgeon: Roseanna Rainbow, MD;  Location: [facility];  Service: Gynecology;  Laterality: N/A;  Marsupralization og bartholin gland, cyst biopsy    History  Substance Use Topics  . Smoking status: Never Smoker   . Smokeless tobacco: Never Used  .  Alcohol Use: No    No family history on file.  Allergies  Allergen Reactions  . Vicodin (Hydrocodone-Acetaminophen) Hives and Itching    Medication list has been reviewed and updated.  Current Outpatient Prescriptions on File Prior to Visit  Medication Sig Dispense Refill  . atorvastatin (LIPITOR) 20 MG tablet TAKE ONE TABLET BY MOUTH AT BEDTIME  30 tablet  2  . cetirizine (ZYRTEC) 10 MG tablet Take 10 mg by mouth daily.      Marland Kitchen levETIRAcetam (KEPPRA) 500 MG tablet Take 250-500 mg by mouth every 12 (twelve) hours. 1 tablet at night and 0.5 tablet in the morning.       Marland Kitchen levocetirizine (XYZAL) 5 MG tablet Take 1 tablet (5 mg total) by mouth every evening.  30 tablet  11  . lisinopril-hydrochlorothiazide (PRINZIDE,ZESTORETIC) 20-25 MG per tablet TAKE ONE TABLET BY MOUTH IN THE MORNING  30 tablet  2  . Multiple Vitamins-Minerals (MULTIVITAMINS THER. W/MINERALS) TABS Take 1 tablet by mouth daily.        Marland Kitchen Specialty Vitamins Products (MENOPAUSE SUPPORT PO) Take 2 capsules by mouth 2 (two) times daily.        Review of Systems:  As per HPI- otherwise negative.   Physical Examination: Filed Vitals:   07/22/12 1005  BP:  104/76  Pulse: 72  Temp: 98.5 F (36.9 C)  Resp: 14   Filed Vitals:   07/22/12 1005  Height: 5\' 5"  (1.651 m)  Weight: 188 lb 3.2 oz (85.367 kg)   Body mass index is 31.32 kg/(m^2). Ideal Body Weight: Weight in (lb) to have BMI = 25: 149.9   GEN: WDWN, NAD, Non-toxic, A & O x 3 HEENT: Atraumatic, Normocephalic. Neck supple. No masses, No LAD.  PEERL, EOMI, TM and oropharynx wnl, nasal cavity congested Ears and Nose: No external deformity. CV: RRR, No M/G/R. No JVD. No thrill. No extra heart sounds. PULM: CTA B, crackles, rhonchi. No retractions. No resp. distress. No accessory muscle use. Minimal wheezing bilaterally  EXTR: No c/c/e NEURO Normal gait.  PSYCH: Normally interactive. Conversant. Not depressed or anxious appearing.  Calm demeanor.     Assessment and Plan: 1. Cough  albuterol (PROVENTIL HFA;VENTOLIN HFA) 108 (90 BASE) MCG/ACT inhaler, benzonatate (TESSALON) 100 MG capsule  2. Bronchitis, allergic  albuterol (PROVENTIL HFA;VENTOLIN HFA) 108 (90 BASE) MCG/ACT inhaler, doxycycline (VIBRAMYCIN) 100 MG capsule  3. Allergic rhinitis  fluticasone (FLONASE) 50 MCG/ACT nasal spray  4. Needs flu shot  Flu vaccine greater than or equal to 3yo preservative free IM  treat as above for likely allergic bronchitis.  Did give an rx for doxy but she can hold onto this for a day or two to see if she needs it.  Albuterol, tessalon prn, flonase.  Did give flu shot today per her request as she is not febrile    Deoni Cosey, MD

## 2012-08-11 ENCOUNTER — Other Ambulatory Visit: Payer: Self-pay | Admitting: Physician Assistant

## 2012-08-17 ENCOUNTER — Encounter: Payer: Self-pay | Admitting: Family Medicine

## 2012-08-17 DIAGNOSIS — R569 Unspecified convulsions: Secondary | ICD-10-CM | POA: Insufficient documentation

## 2012-08-25 ENCOUNTER — Ambulatory Visit: Payer: Federal, State, Local not specified - PPO

## 2012-08-25 ENCOUNTER — Ambulatory Visit (INDEPENDENT_AMBULATORY_CARE_PROVIDER_SITE_OTHER): Payer: Federal, State, Local not specified - PPO | Admitting: Family Medicine

## 2012-08-25 VITALS — BP 110/80 | HR 82 | Temp 97.7°F | Resp 18 | Ht 65.0 in | Wt 184.0 lb

## 2012-08-25 DIAGNOSIS — M25519 Pain in unspecified shoulder: Secondary | ICD-10-CM

## 2012-08-25 DIAGNOSIS — M25512 Pain in left shoulder: Secondary | ICD-10-CM

## 2012-08-25 DIAGNOSIS — S43429A Sprain of unspecified rotator cuff capsule, initial encounter: Secondary | ICD-10-CM

## 2012-08-25 MED ORDER — CYCLOBENZAPRINE HCL 5 MG PO TABS: ORAL_TABLET | ORAL | Status: DC

## 2012-08-25 MED ORDER — MELOXICAM 15 MG PO TABS: 15.0000 mg | ORAL_TABLET | Freq: Every day | ORAL | Status: DC

## 2012-08-25 NOTE — Patient Instructions (Addendum)
1. Left shoulder pain  DG Shoulder Left  2. Rotator cuff (capsule) sprain  meloxicam (MOBIC) 15 MG tablet   Impingement Syndrome, Rotator Cuff, Bursitis with Rehab Impingement syndrome is a condition that involves inflammation of the tendons of the rotator cuff and the subacromial bursa, that causes pain in the shoulder. The rotator cuff consists of four tendons and muscles that control much of the shoulder and upper arm function. The subacromial bursa is a fluid filled sac that helps reduce friction between the rotator cuff and one of the bones of the shoulder (acromion). Impingement syndrome is usually an overuse injury that causes swelling of the bursa (bursitis), swelling of the tendon (tendonitis), and/or a tear of the tendon (strain). Strains are classified into three categories. Grade 1 strains cause pain, but the tendon is not lengthened. Grade 2 strains include a lengthened ligament, due to the ligament being stretched or partially ruptured. With grade 2 strains there is still function, although the function may be decreased. Grade 3 strains include a complete tear of the tendon or muscle, and function is usually impaired. SYMPTOMS   Pain around the shoulder, often at the outer portion of the upper arm.  Pain that gets worse with shoulder function, especially when reaching overhead or lifting.  Sometimes, aching when not using the arm.  Pain that wakes you up at night.  Sometimes, tenderness, swelling, warmth, or redness over the affected area.  Loss of strength.  Limited motion of the shoulder, especially reaching behind the back (to the back pocket or to unhook bra) or across your body.  Crackling sound (crepitation) when moving the arm.  Biceps tendon pain and inflammation (in the front of the shoulder). Worse when bending the elbow or lifting. CAUSES  Impingement syndrome is often an overuse injury, in which chronic (repetitive) motions cause the tendons or bursa to become  inflamed. A strain occurs when a force is paced on the tendon or muscle that is greater than it can withstand. Common mechanisms of injury include: Stress from sudden increase in duration, frequency, or intensity of training.  Direct hit (trauma) to the shoulder.  Aging, erosion of the tendon with normal use.  Bony bump on shoulder (acromial spur). RISK INCREASES WITH:  Contact sports (football, wrestling, boxing).  Throwing sports (baseball, tennis, volleyball).  Weightlifting and bodybuilding.  Heavy labor.  Previous injury to the rotator cuff, including impingement.  Poor shoulder strength and flexibility.  Failure to warm up properly before activity.  Inadequate protective equipment.  Old age.  Bony bump on shoulder (acromial spur). PREVENTION   Warm up and stretch properly before activity.  Allow for adequate recovery between workouts.  Maintain physical fitness:  Strength, flexibility, and endurance.  Cardiovascular fitness.  Learn and use proper exercise technique. PROGNOSIS  If treated properly, impingement syndrome usually goes away within 6 weeks. Sometimes surgery is required.  RELATED COMPLICATIONS   Longer healing time if not properly treated, or if not given enough time to heal.  Recurring symptoms, that result in a chronic condition.  Shoulder stiffness, frozen shoulder, or loss of motion.  Rotator cuff tendon tear.  Recurring symptoms, especially if activity is resumed too soon, with overuse, with a direct blow, or when using poor technique. TREATMENT  Treatment first involves the use of ice and medicine, to reduce pain and inflammation. The use of strengthening and stretching exercises may help reduce pain with activity. These exercises may be performed at home or with a therapist. If non-surgical treatment  is unsuccessful after more than 6 months, surgery may be advised. After surgery and rehabilitation, activity is usually possible in 3  months.  MEDICATION  If pain medicine is needed, nonsteroidal anti-inflammatory medicines (aspirin and ibuprofen), or other minor pain relievers (acetaminophen), are often advised.  Do not take pain medicine for 7 days before surgery.  Prescription pain relievers may be given, if your caregiver thinks they are needed. Use only as directed and only as much as you need.  Corticosteroid injections may be given by your caregiver. These injections should be reserved for the most serious cases, because they may only be given a certain number of times. HEAT AND COLD  Cold treatment (icing) should be applied for 10 to 15 minutes every 2 to 3 hours for inflammation and pain, and immediately after activity that aggravates your symptoms. Use ice packs or an ice massage.  Heat treatment may be used before performing stretching and strengthening activities prescribed by your caregiver, physical therapist, or athletic trainer. Use a heat pack or a warm water soak. SEEK MEDICAL CARE IF:   Symptoms get worse or do not improve in 4 to 6 weeks, despite treatment.  New, unexplained symptoms develop. (Drugs used in treatment may produce side effects.) EXERCISES  RANGE OF MOTION (ROM) AND STRETCHING EXERCISES - Impingement Syndrome (Rotator Cuff  Tendinitis, Bursitis) These exercises may help you when beginning to rehabilitate your injury. Your symptoms may go away with or without further involvement from your physician, physical therapist or athletic trainer. While completing these exercises, remember:   Restoring tissue flexibility helps normal motion to return to the joints. This allows healthier, less painful movement and activity.  An effective stretch should be held for at least 30 seconds.  A stretch should never be painful. You should only feel a gentle lengthening or release in the stretched tissue. STRETCH  Flexion, Standing  Stand with good posture. With an underhand grip on your right / left  hand, and an overhand grip on the opposite hand, grasp a broomstick or cane so that your hands are a little more than shoulder width apart.  Keeping your right / left elbow straight and shoulder muscles relaxed, push the stick with your opposite hand, to raise your right / left arm in front of your body and then overhead. Raise your arm until you feel a stretch in your right / left shoulder, but before you have increased shoulder pain.  Try to avoid shrugging your right / left shoulder as your arm rises, by keeping your shoulder blade tucked down and toward your mid-back spine. Hold for __________ seconds.  Slowly return to the starting position. Repeat __________ times. Complete this exercise __________ times per day. STRETCH  Abduction, Supine  Lie on your back. With an underhand grip on your right / left hand and an overhand grip on the opposite hand, grasp a broomstick or cane so that your hands are a little more than shoulder width apart.  Keeping your right / left elbow straight and your shoulder muscles relaxed, push the stick with your opposite hand, to raise your right / left arm out to the side of your body and then overhead. Raise your arm until you feel a stretch in your right / left shoulder, but before you have increased shoulder pain.  Try to avoid shrugging your right / left shoulder as your arm rises, by keeping your shoulder blade tucked down and toward your mid-back spine. Hold for __________ seconds.  Slowly return  to the starting position. Repeat __________ times. Complete this exercise __________ times per day. ROM  Flexion, Active-Assisted  Lie on your back. You may bend your knees for comfort.  Grasp a broomstick or cane so your hands are about shoulder width apart. Your right / left hand should grip the end of the stick, so that your hand is positioned "thumbs-up," as if you were about to shake hands.  Using your healthy arm to lead, raise your right / left arm  overhead, until you feel a gentle stretch in your shoulder. Hold for __________ seconds.  Use the stick to assist in returning your right / left arm to its starting position. Repeat __________ times. Complete this exercise __________ times per day.  ROM - Internal Rotation, Supine   Lie on your back on a firm surface. Place your right / left elbow about 60 degrees away from your side. Elevate your elbow with a folded towel, so that the elbow and shoulder are the same height.  Using a broomstick or cane and your strong arm, pull your right / left hand toward your body until you feel a gentle stretch, but no increase in your shoulder pain. Keep your shoulder and elbow in place throughout the exercise.  Hold for __________ seconds. Slowly return to the starting position. Repeat __________ times. Complete this exercise __________ times per day. STRETCH - Internal Rotation  Place your right / left hand behind your back, palm up.  Throw a towel or belt over your opposite shoulder. Grasp the towel with your right / left hand.  While keeping an upright posture, gently pull up on the towel, until you feel a stretch in the front of your right / left shoulder.  Avoid shrugging your right / left shoulder as your arm rises, by keeping your shoulder blade tucked down and toward your mid-back spine.  Hold for __________ seconds. Release the stretch, by lowering your healthy hand. Repeat __________ times. Complete this exercise __________ times per day. ROM - Internal Rotation   Using an underhand grip, grasp a stick behind your back with both hands.  While standing upright with good posture, slide the stick up your back until you feel a mild stretch in the front of your shoulder.  Hold for __________ seconds. Slowly return to your starting position. Repeat __________ times. Complete this exercise __________ times per day.  STRETCH  Posterior Shoulder Capsule   Stand or sit with good posture. Grasp  your right / left elbow and draw it across your chest, keeping it at the same height as your shoulder.  Pull your elbow, so your upper arm comes in closer to your chest. Pull until you feel a gentle stretch in the back of your shoulder.  Hold for __________ seconds. Repeat __________ times. Complete this exercise __________ times per day. STRENGTHENING EXERCISES - Impingement Syndrome (Rotator Cuff Tendinitis, Bursitis) These exercises may help you when beginning to rehabilitate your injury. They may resolve your symptoms with or without further involvement from your physician, physical therapist or athletic trainer. While completing these exercises, remember:  Muscles can gain both the endurance and the strength needed for everyday activities through controlled exercises.  Complete these exercises as instructed by your physician, physical therapist or athletic trainer. Increase the resistance and repetitions only as guided.  You may experience muscle soreness or fatigue, but the pain or discomfort you are trying to eliminate should never worsen during these exercises. If this pain does get worse, stop and  make sure you are following the directions exactly. If the pain is still present after adjustments, discontinue the exercise until you can discuss the trouble with your clinician.  During your recovery, avoid activity or exercises which involve actions that place your injured hand or elbow above your head or behind your back or head. These positions stress the tissues which you are trying to heal. STRENGTH - Scapular Depression and Adduction   With good posture, sit on a firm chair. Support your arms in front of you, with pillows, arm rests, or on a table top. Have your elbows in line with the sides of your body.  Gently draw your shoulder blades down and toward your mid-back spine. Gradually increase the tension, without tensing the muscles along the top of your shoulders and the back of your  neck.  Hold for __________ seconds. Slowly release the tension and relax your muscles completely before starting the next repetition.  After you have practiced this exercise, remove the arm support and complete the exercise in standing as well as sitting position. Repeat __________ times. Complete this exercise __________ times per day.  STRENGTH - Shoulder Abductors, Isometric  With good posture, stand or sit about 4-6 inches from a wall, with your right / left side facing the wall.  Bend your right / left elbow. Gently press your right / left elbow into the wall. Increase the pressure gradually, until you are pressing as hard as you can, without shrugging your shoulder or increasing any shoulder discomfort.  Hold for __________ seconds.  Release the tension slowly. Relax your shoulder muscles completely before you begin the next repetition. Repeat __________ times. Complete this exercise __________ times per day.  STRENGTH - External Rotators, Isometric  Keep your right / left elbow at your side and bend it 90 degrees.  Step into a door frame so that the outside of your right / left wrist can press against the door frame without your upper arm leaving your side.  Gently press your right / left wrist into the door frame, as if you were trying to swing the back of your hand away from your stomach. Gradually increase the tension, until you are pressing as hard as you can, without shrugging your shoulder or increasing any shoulder discomfort.  Hold for __________ seconds.  Release the tension slowly. Relax your shoulder muscles completely before you begin the next repetition. Repeat __________ times. Complete this exercise __________ times per day.  STRENGTH - Supraspinatus   Stand or sit with good posture. Grasp a __________ weight, or an exercise band or tubing, so that your hand is "thumbs-up," like you are shaking hands.  Slowly lift your right / left arm in a "V" away from your  thigh, diagonally into the space between your side and straight ahead. Lift your hand to shoulder height or as far as you can, without increasing any shoulder pain. At first, many people do not lift their hands above shoulder height.  Avoid shrugging your right / left shoulder as your arm rises, by keeping your shoulder blade tucked down and toward your mid-back spine.  Hold for __________ seconds. Control the descent of your hand, as you slowly return to your starting position. Repeat __________ times. Complete this exercise __________ times per day.  STRENGTH - External Rotators  Secure a rubber exercise band or tubing to a fixed object (table, pole) so that it is at the same height as your right / left elbow when you are standing  or sitting on a firm surface.  Stand or sit so that the secured exercise band is at your uninjured side.  Bend your right / left elbow 90 degrees. Place a folded towel or small pillow under your right / left arm, so that your elbow is a few inches away from your side.  Keeping the tension on the exercise band, pull it away from your body, as if pivoting on your elbow. Be sure to keep your body steady, so that the movement is coming only from your rotating shoulder.  Hold for __________ seconds. Release the tension in a controlled manner, as you return to the starting position. Repeat __________ times. Complete this exercise __________ times per day.  STRENGTH - Internal Rotators   Secure a rubber exercise band or tubing to a fixed object (table, pole) so that it is at the same height as your right / left elbow when you are standing or sitting on a firm surface.  Stand or sit so that the secured exercise band is at your right / left side.  Bend your elbow 90 degrees. Place a folded towel or small pillow under your right / left arm so that your elbow is a few inches away from your side.  Keeping the tension on the exercise band, pull it across your body, toward  your stomach. Be sure to keep your body steady, so that the movement is coming only from your rotating shoulder.  Hold for __________ seconds. Release the tension in a controlled manner, as you return to the starting position. Repeat __________ times. Complete this exercise __________ times per day.  STRENGTH - Scapular Protractors, Standing   Stand arms length away from a wall. Place your hands on the wall, keeping your elbows straight.  Begin by dropping your shoulder blades down and toward your mid-back spine.  To strengthen your protractors, keep your shoulder blades down, but slide them forward on your rib cage. It will feel as if you are lifting the back of your rib cage away from the wall. This is a subtle motion and can be challenging to complete. Ask your caregiver for further instruction, if you are not sure you are doing the exercise correctly.  Hold for __________ seconds. Slowly return to the starting position, resting the muscles completely before starting the next repetition. Repeat __________ times. Complete this exercise __________ times per day. STRENGTH - Scapular Protractors, Supine  Lie on your back on a firm surface. Extend your right / left arm straight into the air while holding a __________ weight in your hand.  Keeping your head and back in place, lift your shoulder off the floor.  Hold for __________ seconds. Slowly return to the starting position, and allow your muscles to relax completely before starting the next repetition. Repeat __________ times. Complete this exercise __________ times per day. STRENGTH - Scapular Protractors, Quadruped  Get onto your hands and knees, with your shoulders directly over your hands (or as close as you can be, comfortably).  Keeping your elbows locked, lift the back of your rib cage up into your shoulder blades, so your mid-back rounds out. Keep your neck muscles relaxed.  Hold this position for __________ seconds. Slowly  return to the starting position and allow your muscles to relax completely before starting the next repetition. Repeat __________ times. Complete this exercise __________ times per day.  STRENGTH - Scapular Retractors  Secure a rubber exercise band or tubing to a fixed object (table, pole), so that  it is at the height of your shoulders when you are either standing, or sitting on a firm armless chair.  With a palm down grip, grasp an end of the band in each hand. Straighten your elbows and lift your hands straight in front of you, at shoulder height. Step back, away from the secured end of the band, until it becomes tense.  Squeezing your shoulder blades together, draw your elbows back toward your sides, as you bend them. Keep your upper arms lifted away from your body throughout the exercise.  Hold for __________ seconds. Slowly ease the tension on the band, as you reverse the directions and return to the starting position. Repeat __________ times. Complete this exercise __________ times per day. STRENGTH - Shoulder Extensors   Secure a rubber exercise band or tubing to a fixed object (table, pole) so that it is at the height of your shoulders when you are either standing, or sitting on a firm armless chair.  With a thumbs-up grip, grasp an end of the band in each hand. Straighten your elbows and lift your hands straight in front of you, at shoulder height. Step back, away from the secured end of the band, until it becomes tense.  Squeezing your shoulder blades together, pull your hands down to the sides of your thighs. Do not allow your hands to go behind you.  Hold for __________ seconds. Slowly ease the tension on the band, as you reverse the directions and return to the starting position. Repeat __________ times. Complete this exercise __________ times per day.  STRENGTH - Scapular Retractors and External Rotators   Secure a rubber exercise band or tubing to a fixed object (table, pole)  so that it is at the height as your shoulders, when you are either standing, or sitting on a firm armless chair.  With a palm down grip, grasp an end of the band in each hand. Bend your elbows 90 degrees and lift your elbows to shoulder height, at your sides. Step back, away from the secured end of the band, until it becomes tense.  Squeezing your shoulder blades together, rotate your shoulders so that your upper arms and elbows remain stationary, but your fists travel upward to head height.  Hold for __________ seconds. Slowly ease the tension on the band, as you reverse the directions and return to the starting position. Repeat __________ times. Complete this exercise __________ times per day.  STRENGTH - Scapular Retractors and External Rotators, Rowing   Secure a rubber exercise band or tubing to a fixed object (table, pole) so that it is at the height of your shoulders, when you are either standing, or sitting on a firm armless chair.  With a palm down grip, grasp an end of the band in each hand. Straighten your elbows and lift your hands straight in front of you, at shoulder height. Step back, away from the secured end of the band, until it becomes tense.  Step 1: Squeeze your shoulder blades together. Bending your elbows, draw your hands to your chest, as if you are rowing a boat. At the end of this motion, your hands and elbow should be at shoulder height and your elbows should be out to your sides.  Step 2: Rotate your shoulders, to raise your hands above your head. Your forearms should be vertical and your upper arms should be horizontal.  Hold for __________ seconds. Slowly ease the tension on the band, as you reverse the directions and return to  the starting position. Repeat __________ times. Complete this exercise __________ times per day.  STRENGTH  Scapular Depressors  Find a sturdy chair without wheels, such as a dining room chair.  Keeping your feet on the floor, and your  hands on the chair arms, lift your bottom up from the seat, and lock your elbows.  Keeping your elbows straight, allow gravity to pull your body weight down. Your shoulders will rise toward your ears.  Raise your body against gravity by drawing your shoulder blades down your back, shortening the distance between your shoulders and ears. Although your feet should always maintain contact with the floor, your feet should progressively support less body weight, as you get stronger.  Hold for __________ seconds. In a controlled and slow manner, lower your body weight to begin the next repetition. Repeat __________ times. Complete this exercise __________ times per day.  Document Released: 11/04/2005 Document Revised: 01/27/2012 Document Reviewed: 02/16/2009 [facility] Patient Information 2013 [place], [place].

## 2012-08-25 NOTE — Progress Notes (Signed)
[address]   [place], [place]  [address]   [id number]  Subjective:    Patient ID: Hannah Nguyen, female    DOB: 1961-06-20, 51 y.o.   MRN: [id number]  HPIThis 51 y.o. female presents for evaluation of arm pain L.  Received flu vaccine month ago in L deltoid with subsequent onset of L shoulder and arm pain.  Has used heat, ice, Ibuprofen.  No neck pain associated with arm pain. Starts in L shoulder and radiates into fingertips.  Unable to sleep on arm; must sleep sitting up.  No n/t in arm but some weakness in arm.  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  Small local swelling at site of flu shot.  Coworkers noticed redness to arm but patient has not noticed redness.  +fever in arm.  No fever/chills/sweat/malaise.   No previous injury to shoulder; no known arthritis of L shoulder.   Review of Systems  Constitutional: Negative for fever, chills, diaphoresis and fatigue.  HENT: Negative for neck pain and neck stiffness.   Musculoskeletal: Positive for myalgias and arthralgias. Negative for back pain and joint swelling.  Skin: Negative for color change, pallor, rash and wound.  Neurological: Positive for weakness. Negative for dizziness, light-headedness, numbness and headaches.    Past Medical History  Diagnosis Date  . Hypertension   . Elevated cholesterol   . GERD (gastroesophageal reflux disease)   . Seizures     2 months ago was last episode of uncontrollable rt arm motions and passed out  . Nephrolithiasis     Past Surgical History  Procedure Date  . Lithotripsy   . Cesarean section     x2  . Knee arthroscopy     x2  . Thumb surg   . Robotic assisted lap vaginal hysterectomy 10/25/2011    Procedure: ROBOTIC ASSISTED LAPAROSCOPIC VAGINAL HYSTERECTOMY;  Surgeon: Roseanna Rainbow, MD;  Location: [facility];  Service: Gynecology;  Laterality: N/A;  Marsupralization og bartholin gland, cyst biopsy     Prior to Admission medications   Medication Sig Start Date End Date Taking? Authorizing Provider  albuterol (PROVENTIL HFA;VENTOLIN HFA) 108 (90 BASE) MCG/ACT inhaler Inhale 2 puffs into the lungs every 6 (six) hours as needed for wheezing. 07/22/12 07/22/13 Yes Jessica C Copland, MD  atorvastatin (LIPITOR) 20 MG tablet TAKE ONE TABLET BY MOUTH AT BEDTIME 08/11/12  Yes Marzella Schlein McClung, PA-C  cetirizine (ZYRTEC) 10 MG tablet Take 10 mg by mouth daily.   Yes Historical Provider, MD  fluticasone (FLONASE) 50 MCG/ACT nasal spray Place 2 sprays into the nose daily. 07/22/12 07/22/13 Yes Gwenlyn Found Copland, MD  levETIRAcetam (KEPPRA) 500 MG tablet Take 250-500 mg by mouth every 12 (twelve) hours. 1 tablet at night and 0.5 tablet in the morning.    Yes Historical Provider, MD  levocetirizine (XYZAL) 5 MG tablet Take 1 tablet (5 mg total) by mouth every evening. 04/20/12 04/20/13 Yes Gwenlyn Found Copland, MD  lisinopril-hydrochlorothiazide (PRINZIDE,ZESTORETIC) 20-25 MG per tablet TAKE ONE TABLET BY MOUTH IN THE MORNING 08/11/12  Yes Anders Simmonds, PA-C  Multiple Vitamins-Minerals (MULTIVITAMINS THER. W/MINERALS) TABS Take 1 tablet by mouth daily.     Yes Historical Provider, MD  Specialty Vitamins Products (MENOPAUSE SUPPORT PO) Take 2 capsules by mouth 2 (two) times daily.   Yes Historical Provider, MD    Allergies  Allergen Reactions  . Vicodin (Hydrocodone-Acetaminophen) Hives and Itching  History   Social History  . Marital Status: Married    Spouse Name: N/A    Number of Children: N/A  . Years of Education: N/A   Occupational History  . Not on file.   Social History Main Topics  . Smoking status: Never Smoker   . Smokeless tobacco: Never Used  . Alcohol Use: No  . Drug Use: No  . Sexually Active: Not on file   Other Topics Concern  . Not on file   Social History Narrative  . No narrative on file    No family history on file.      Objective:   Physical Exam  Constitutional:  She is oriented to person, place, and time. She appears well-developed and well-nourished.  HENT:  Head: Normocephalic and atraumatic.  Cardiovascular: Intact distal pulses.        Capillary refill < 3 seconds L hand.  Musculoskeletal:       Left shoulder: She exhibits decreased range of motion, tenderness, bony tenderness and pain. She exhibits no swelling, no effusion, no crepitus, no deformity, no spasm, normal pulse and normal strength.       Empty can sign +; cross over +; pain with elevation > 75 degrees L shoulder; motor 5/5 all directions; able to elevate shoulder to 180 degrees.  Grip 5/5. Cervical spine: full ROM cervical spine without limitations or pain; non-tender at trapezius region.  Neurological: She is alert and oriented to person, place, and time. No cranial nerve deficit or sensory deficit. She exhibits normal muscle tone.  Skin: Skin is warm and dry. No rash noted. No erythema. No pallor.  Psychiatric: She has a normal mood and affect. Her behavior is normal. Judgment and thought content normal.      [facility] reading (PRIMARY) by  Dr. Katrinka Blazing.  L shoulder xray:  NAD   Assessment & Plan:   1. Left shoulder pain  DG Shoulder Left  2. Rotator cuff (capsule) sprain  meloxicam (MOBIC) 15 MG tablet, cyclobenzaprine (FLEXERIL) 5 MG tablet     1.  L Shoulder Pain:  New.  Doubt due to recent influenza vaccine; rx for Mobic and Flexeril provided. 2.  Rotator Cuff Strain L:  New.  Likely secondary to overuse at work.  Rx for Mobic, Flexeril.  Home exercises also provided to perform daily.  To rest ar and avoid lifting > 10 pounds for next 2-3 weeks; also advised to avoid overuse.  If no improvement in one month, to call for ortho referral.

## 2012-11-27 ENCOUNTER — Other Ambulatory Visit: Payer: Self-pay | Admitting: Physician Assistant

## 2012-12-27 ENCOUNTER — Other Ambulatory Visit: Payer: Self-pay | Admitting: Physician Assistant

## 2013-05-06 ENCOUNTER — Ambulatory Visit (INDEPENDENT_AMBULATORY_CARE_PROVIDER_SITE_OTHER): Payer: Federal, State, Local not specified - PPO | Admitting: Family Medicine

## 2013-05-06 VITALS — BP 159/90 | HR 67 | Temp 98.0°F | Resp 16 | Ht 65.5 in | Wt 171.0 lb

## 2013-05-06 DIAGNOSIS — E785 Hyperlipidemia, unspecified: Secondary | ICD-10-CM

## 2013-05-06 DIAGNOSIS — J309 Allergic rhinitis, unspecified: Secondary | ICD-10-CM

## 2013-05-06 DIAGNOSIS — I10 Essential (primary) hypertension: Secondary | ICD-10-CM

## 2013-05-06 LAB — LIPID PANEL
Cholesterol: 216 mg/dL — ABNORMAL HIGH (ref 0–200)
HDL: 44 mg/dL (ref >39)
Triglycerides: 106 mg/dL (ref <150)
VLDL: 21 mg/dL (ref 0–40)

## 2013-05-06 LAB — COMPREHENSIVE METABOLIC PANEL
Albumin: 4 g/dL (ref 3.5–5.2)
BUN: 10 mg/dL (ref 6–23)
CO2: 31 mEq/L (ref 19–32)
Glucose, Bld: 83 mg/dL (ref 70–99)
Sodium: 139 mEq/L (ref 135–145)
Total Bilirubin: 0.5 mg/dL (ref 0.3–1.2)
Total Protein: 7.1 g/dL (ref 6.0–8.3)

## 2013-05-06 MED ORDER — ATORVASTATIN CALCIUM 20 MG PO TABS: 20.0000 mg | ORAL_TABLET | Freq: Every day | ORAL | Status: DC

## 2013-05-06 MED ORDER — LEVOCETIRIZINE DIHYDROCHLORIDE 5 MG PO TABS: 5.0000 mg | ORAL_TABLET | Freq: Every evening | ORAL | Status: DC

## 2013-05-06 MED ORDER — LISINOPRIL-HYDROCHLOROTHIAZIDE 20-25 MG PO TABS: 1.0000 | ORAL_TABLET | Freq: Every day | ORAL | Status: DC

## 2013-05-06 NOTE — Progress Notes (Signed)
Subjective: Patient is here for refill of her blood pressure medications. She had been in a stressful situation in life having gained weight up, but now is independent and has made to lose most of the weight she gained. We talked about the fact that he would be good if she could continue to lose 10 or 15 more pounds. She has been walking to work. She has been out of her blood pressure medications. She is not having any chest pains or shortness of breath or GI complaints or headaches.  Objective: Pleasant lady in no major distress. Throat clear. Neck supple without nodes or thyromegaly. No carotid bruits. Chest is clear. Heart regular without murmurs. Abdomen soft without mass or tenderness. No ankle edema.  Assessment: Hypertension unsatisfactory control Need of screening colonoscopy Allergic rhinitis Hyperlipidemia  Plan: Urged her to schedule herself for a colonoscopy  Refill medications  Return in about 6 months

## 2013-05-06 NOTE — Patient Instructions (Signed)
Schedule a colonoscopy screen at Dr [address] and Elnoria Howard [phone]  Take medicines faithfully  Continue to exercise and lose weight  Return in 6 months for recheck, about the December

## 2013-06-29 LAB — CBC AND DIFFERENTIAL
Platelets: 284 10*3/uL (ref 150–399)
WBC: 6.3 10^3/mL

## 2013-09-15 LAB — HM COLONOSCOPY: HM Colonoscopy: NORMAL

## 2013-09-23 ENCOUNTER — Other Ambulatory Visit: Payer: Self-pay

## 2013-09-24 ENCOUNTER — Encounter: Payer: Self-pay | Admitting: Physician Assistant

## 2013-10-02 ENCOUNTER — Other Ambulatory Visit: Payer: Self-pay | Admitting: Neurology

## 2013-10-02 ENCOUNTER — Other Ambulatory Visit: Payer: Self-pay | Admitting: Physician Assistant

## 2013-10-05 ENCOUNTER — Telehealth: Payer: Self-pay | Admitting: Neurology

## 2013-10-05 NOTE — Telephone Encounter (Signed)
Scheduled meds f/u appt,confirmed w/ patient

## 2013-10-07 ENCOUNTER — Ambulatory Visit (INDEPENDENT_AMBULATORY_CARE_PROVIDER_SITE_OTHER): Payer: Federal, State, Local not specified - PPO | Admitting: Neurology

## 2013-10-07 ENCOUNTER — Encounter: Payer: Self-pay | Admitting: Neurology

## 2013-10-07 VITALS — BP 130/77 | HR 75 | Ht 66.0 in | Wt 176.0 lb

## 2013-10-07 DIAGNOSIS — G43909 Migraine, unspecified, not intractable, without status migrainosus: Secondary | ICD-10-CM

## 2013-10-07 DIAGNOSIS — R569 Unspecified convulsions: Secondary | ICD-10-CM

## 2013-10-07 MED ORDER — LEVETIRACETAM ER 750 MG PO TB24: 750.0000 mg | ORAL_TABLET | Freq: Every day | ORAL | Status: DC

## 2013-10-07 NOTE — Progress Notes (Signed)
History of Present Illness:   Hannah Nguyen is a 52 year old right-handed female, alone at today's clinical visit following up for seizure.  She has past medical history of hypertension, hyperlipidemia, migraine headaches, presenting with passing out episode in January 29, 2010.  she was with her family at home, after watching TV for a while in January 29 2010, she decided to fix something for her child, she remember walking to the kitchen, the next thing, she woke up on the floor, EMS were there, all rounding her, she apparently had a passout, there was no evidence she had hurt herself, no evidence of trauma, urinary incontinence, there was no tongue biting. Family started trying to arouse her by rubbing her hands, shaking her, 3-5 minutes before she was able to be aroused completely, but that time EMS has arrived.  She was admitted to hospital on January 29 2010, during her first episode, she was started on Keppra 500 mg twice a day, but she is not taking it regularly.  Echocardiogram showed ejection fraction 60-65%, EKG and telemetry monitoring was normal, CT scan of the head was normal.  Since the initial episode, she had episode of uncontrollable right arm shaking, lasting few minutes, without loss of consciousness, she often feel hot, with elevated blood pressure during the episode, this can happen 3 times or evenl more times in a week.  Since Keppra was started, she has much less recurrent episode, and denies any since last seen 08/07/11.  Her episode in the past described as stereotypical intermittent right arm uncontrollable shaking, lasting 2-3 minutes, not under her voluntary controlled, no associated right face or right lower extremity shaking. Keppra make her drowsy.  EEG showed intermittent right central slowing, occasionally right P4 sharp transient. MRI of the brain has demonstrated supratentorial small vessel disease,atrophy more than  expected for her age.   UPDATE 10/07/2013:   She is  taking Keppra 250 in the morning, 500 every night, there was no recurrent events since last visit in September 2013, she complains of left-sided low back pain, radiating pain to her left leg  Review of Systems  Out of a complete 14 system review, the patient complains of only the following symptoms, and all other reviewed systems are negative.  Fatigue, seizure,  Social History   Patient lives at home with her husband. Manson Passey) The couple has 2 children. Patient works at [employer]. Patient denies any  history of tobacco, alcohol and illicit drugs. Patient does drink caffeine. Inhaled Tobacco Use: never smoker  Family History  Patients parents are both alive.  Patients mother --cancer,SZ, high blood pressure  Past Medical History  High blood pressure High cholesterol Migraines  Surgical History  2-C sections Left knee Right thumb lithotripsy hysterectomy Dec &, 2012  Neurologic Exam  Mental Status: pleasant, awake, alert, cooperative to history, talking, and casual conversation. Cranial Nerves: CN II-XII pupils were equal round reactive to light.  Extraocular movements were full.  Visual fields were full on confrontational testl  Facial sensation and strength were normal.  Hearing was intact to finger rubbing bilaterally.  Uvula tongue were midling.  Head turning and shoulder shrugging were normal and symmetric.  Tongue protrusion into the cheeks strength were normal. Motor: Normal tone, bulk, and strength. Sensory: intact to light touch. Coordination: Normal finger-to-nose and heel-to-shin.  There was no dysmetria noticed. Gait and Station: Narrow based and steady, was able to perform tiptoe, heel, and tandem walking without difficulty.  [place] sign: Negative. Reflexes: Deep tendon reflexes: hypoactive  and symmetric. 2/2 Plantar responses were flexor.  Assessment and Plan:  52 year old Caucasian female, with probable partial seizure, and also occasional secondary generalization.  MRI brain showed diffuse atrophy, supratentorium small vessel disease. No events for more than one year.  She is taking Keppra 250/500.  1.  Refill Keppra xr 750mg  qhs.  2. RTC in one year.

## 2013-10-19 ENCOUNTER — Telehealth: Payer: Self-pay | Admitting: *Deleted

## 2013-10-19 NOTE — Telephone Encounter (Signed)
Reaction is to medication Keppra XR, please advise.

## 2013-10-26 ENCOUNTER — Ambulatory Visit (INDEPENDENT_AMBULATORY_CARE_PROVIDER_SITE_OTHER): Payer: Federal, State, Local not specified - PPO | Admitting: Family Medicine

## 2013-10-26 DIAGNOSIS — Z23 Encounter for immunization: Secondary | ICD-10-CM

## 2013-11-02 ENCOUNTER — Ambulatory Visit: Payer: Federal, State, Local not specified - PPO | Admitting: Physician Assistant

## 2013-12-15 ENCOUNTER — Other Ambulatory Visit: Payer: Self-pay | Admitting: Family Medicine

## 2014-01-31 ENCOUNTER — Ambulatory Visit (INDEPENDENT_AMBULATORY_CARE_PROVIDER_SITE_OTHER): Payer: Federal, State, Local not specified - PPO | Admitting: Family Medicine

## 2014-01-31 ENCOUNTER — Ambulatory Visit: Payer: Federal, State, Local not specified - PPO

## 2014-01-31 VITALS — BP 130/88 | HR 97 | Temp 99.1°F | Resp 16 | Ht 65.0 in | Wt 180.0 lb

## 2014-01-31 DIAGNOSIS — K2990 Gastroduodenitis, unspecified, without bleeding: Principal | ICD-10-CM

## 2014-01-31 DIAGNOSIS — E785 Hyperlipidemia, unspecified: Secondary | ICD-10-CM

## 2014-01-31 DIAGNOSIS — R110 Nausea: Secondary | ICD-10-CM

## 2014-01-31 DIAGNOSIS — R109 Unspecified abdominal pain: Secondary | ICD-10-CM

## 2014-01-31 DIAGNOSIS — K2970 Gastritis, unspecified, without bleeding: Secondary | ICD-10-CM

## 2014-01-31 DIAGNOSIS — R142 Eructation: Secondary | ICD-10-CM

## 2014-01-31 DIAGNOSIS — R1012 Left upper quadrant pain: Secondary | ICD-10-CM

## 2014-01-31 DIAGNOSIS — I10 Essential (primary) hypertension: Secondary | ICD-10-CM | POA: Insufficient documentation

## 2014-01-31 LAB — POCT CBC
Granulocyte percent: 85.8 %G — AB (ref 37–80)
HCT, POC: 42.1 % (ref 37.7–47.9)
Hemoglobin: 13.4 g/dL (ref 12.2–16.2)
Lymph, poc: 1 (ref 0.6–3.4)
MCH, POC: 31.1 pg (ref 27–31.2)
MCHC: 31.8 g/dL (ref 31.8–35.4)
MCV: 97.6 fL — AB (ref 80–97)
MID (CBC): 0.4 (ref 0–0.9)
MPV: 10 fL (ref 0–99.8)
PLATELET COUNT, POC: 286 10*3/uL (ref 142–424)
POC Granulocyte: 8.4 — AB (ref 2–6.9)
POC LYMPH PERCENT: 938 %L — AB (ref 10–50)
POC MID %: 4.4 % (ref 0–12)
RBC: 4.31 M/uL (ref 4.04–5.48)
RDW, POC: 14.4 %
WBC: 9.8 10*3/uL (ref 4.6–10.2)

## 2014-01-31 LAB — POCT URINALYSIS DIPSTICK
Bilirubin, UA: NEGATIVE
GLUCOSE UA: NEGATIVE
Ketones, UA: NEGATIVE
LEUKOCYTES UA: NEGATIVE
NITRITE UA: NEGATIVE
Protein, UA: NEGATIVE
Spec Grav, UA: 1.02
UROBILINOGEN UA: 0.2
pH, UA: 7

## 2014-01-31 LAB — POCT UA - MICROSCOPIC ONLY
Bacteria, U Microscopic: NEGATIVE
CASTS, UR, LPF, POC: NEGATIVE
Crystals, Ur, HPF, POC: NEGATIVE
Mucus, UA: NEGATIVE
Yeast, UA: NEGATIVE

## 2014-01-31 MED ORDER — LISINOPRIL-HYDROCHLOROTHIAZIDE 20-25 MG PO TABS: 1.0000 | ORAL_TABLET | Freq: Every day | ORAL | Status: DC

## 2014-01-31 MED ORDER — ONDANSETRON 4 MG PO TBDP
4.0000 mg | ORAL_TABLET | Freq: Once | ORAL | Status: AC
  Administered 2014-01-31: 4 mg via ORAL

## 2014-01-31 MED ORDER — ATORVASTATIN CALCIUM 20 MG PO TABS: 20.0000 mg | ORAL_TABLET | Freq: Every day | ORAL | Status: DC

## 2014-01-31 MED ORDER — OMEPRAZOLE 40 MG PO CPDR: DELAYED_RELEASE_CAPSULE | ORAL | Status: DC

## 2014-01-31 MED ORDER — ONDANSETRON 4 MG PO TBDP: ORAL_TABLET | ORAL | Status: DC

## 2014-01-31 MED ORDER — METOCLOPRAMIDE HCL 10 MG PO TABS: ORAL_TABLET | ORAL | Status: DC

## 2014-01-31 NOTE — Progress Notes (Signed)
Subjective: 53 year old lady who presents with a history of nausea and abdominal pain onset at about 9 AM today. She was a little nauseous last night, but okay when she went to work at 4 AM today. About 9 AM it hit her on a scale of 6-8/10, pain in the left upper quadrant and left flank. She does have a history of some kidney stones in the past which have never past. She had a hysterectomy laparoscopically, no other abdominal surgeries. She had a colonoscopy not too long ago and was told return in 10 years, that everything was fine. She said the pain was not quite cramping but hurt real bad in the left upper abdomen. She's been very nauseous and burping a lot with rotten A. burps. She a little air given something at the [employer] where she works this morning. Is not is been vomiting. She had a normal bowel movement, and she has the last several days. No urinary symptoms. No blood in the stool urine. Has not had this kind of problem in the past.  Objective: Pleasant lady whom I have seen before. She is constantly burping. Chest is clear to auscultation. Heart regular without murmurs. Abdomen has faint bowel sounds, no rushes or tinkling. Percussion, palpation, and auscultation were all essentially normal. Mild tenderness in the lateral left upper quadrant and more tender around in the left CVA area. Right CVA does not seem tender. Skin warm and dry. Temperature was 99.1.  Assessment: Left upper quadrant and left flank pain Nausea Burping  Plan: CBC, urinalysis, abdominal x-rays Zofran 4 mg orally  Results for orders placed in visit on 01/31/14  POCT CBC      Result Value Ref Range   WBC 9.8  4.6 - 10.2 K/uL   Lymph, poc 1.0  0.6 - 3.4   POC LYMPH PERCENT 938 (*) 10 - 50 %L   MID (cbc) 0.4  0 - 0.9   POC MID % 4.4  0 - 12 %M   POC Granulocyte 8.4 (*) 2 - 6.9   Granulocyte percent 85.8 (*) 37 - 80 %G   RBC 4.31  4.04 - 5.48 M/uL   Hemoglobin 13.4  12.2 - 16.2 g/dL   HCT, POC 42.1   37.7 - 47.9 %   MCV 97.6 (*) 80 - 97 fL   MCH, POC 31.1  27 - 31.2 pg   MCHC 31.8  31.8 - 35.4 g/dL   RDW, POC 14.4     Platelet Count, POC 286  142 - 424 K/uL   MPV 10.0  0 - 99.8 fL  POCT UA - MICROSCOPIC ONLY      Result Value Ref Range   WBC, Ur, HPF, POC 2-7     RBC, urine, microscopic 2-5     Bacteria, U Microscopic neg     Mucus, UA neg     Epithelial cells, urine per micros 1-6     Crystals, Ur, HPF, POC neg     Casts, Ur, LPF, POC neg     Yeast, UA neg    POCT URINALYSIS DIPSTICK      Result Value Ref Range   Color, UA yellow     Clarity, UA cloudy     Glucose, UA neg     Bilirubin, UA neg     Ketones, UA neg     Spec Grav, UA 1.020     Blood, UA trace-intact     pH, UA 7.0  Protein, UA neg     Urobilinogen, UA 0.2     Nitrite, UA neg     Leukocytes, UA Negative     [facility] reading (PRIMARY) by  Dr. Linna Darner Normal abdomen.  No obstructive pattern  Treat symptomatically  Incidentally the patient has refills on her cholesterol lipid medication. I gave them to her but asked her to come back in several weeks if he stomach sometimes to get rechecked since her lipids were high last time.

## 2014-01-31 NOTE — Patient Instructions (Addendum)
Take omeprazole 1 daily to suppress stomach acid and reduce pain  Take metoclopramide one every 6 or 8 hours to try and reduce the burping and belching  If you get more nauseated, take the ondansetron one every 6 or 8 hours as needed for nausea or vomiting  If your bowels do not seem to move real well over the next day or so take some MiraLax  If you develop more pain go to the emergency room, otherwise return here as needed if problems persist  Drink plenty of fluids tonight. Avoid trying to eat much.

## 2014-03-08 ENCOUNTER — Ambulatory Visit (INDEPENDENT_AMBULATORY_CARE_PROVIDER_SITE_OTHER): Payer: Federal, State, Local not specified - PPO | Admitting: Family Medicine

## 2014-03-08 VITALS — BP 122/78 | HR 74 | Temp 97.9°F | Resp 18 | Ht 66.0 in | Wt 170.0 lb

## 2014-03-08 DIAGNOSIS — J309 Allergic rhinitis, unspecified: Secondary | ICD-10-CM

## 2014-03-08 DIAGNOSIS — J45909 Unspecified asthma, uncomplicated: Secondary | ICD-10-CM

## 2014-03-08 DIAGNOSIS — M79609 Pain in unspecified limb: Secondary | ICD-10-CM

## 2014-03-08 DIAGNOSIS — R142 Eructation: Secondary | ICD-10-CM

## 2014-03-08 DIAGNOSIS — E785 Hyperlipidemia, unspecified: Secondary | ICD-10-CM

## 2014-03-08 DIAGNOSIS — R059 Cough, unspecified: Secondary | ICD-10-CM

## 2014-03-08 DIAGNOSIS — R1012 Left upper quadrant pain: Secondary | ICD-10-CM

## 2014-03-08 DIAGNOSIS — R05 Cough: Secondary | ICD-10-CM

## 2014-03-08 DIAGNOSIS — R110 Nausea: Secondary | ICD-10-CM

## 2014-03-08 DIAGNOSIS — M79659 Pain in unspecified thigh: Secondary | ICD-10-CM

## 2014-03-08 DIAGNOSIS — I10 Essential (primary) hypertension: Secondary | ICD-10-CM

## 2014-03-08 LAB — POCT CBC
Granulocyte percent: 57.2 %G (ref 37–80)
HCT, POC: 42.7 % (ref 37.7–47.9)
Hemoglobin: 13.6 g/dL (ref 12.2–16.2)
Lymph, poc: 2.3 (ref 0.6–3.4)
MCH, POC: 31.3 pg — AB (ref 27–31.2)
MCHC: 31.9 g/dL (ref 31.8–35.4)
MCV: 98.2 fL — AB (ref 80–97)
MID (cbc): 0.4 (ref 0–0.9)
MPV: 10.1 fL (ref 0–99.8)
POC Granulocyte: 3.6 (ref 2–6.9)
POC LYMPH PERCENT: 37 %L (ref 10–50)
POC MID %: 5.8 %M (ref 0–12)
Platelet Count, POC: 292 10*3/uL (ref 142–424)
RBC: 4.35 M/uL (ref 4.04–5.48)
RDW, POC: 14.3 %
WBC: 6.3 10*3/uL (ref 4.6–10.2)

## 2014-03-08 LAB — COMPREHENSIVE METABOLIC PANEL
ALT: 32 U/L (ref 0–35)
AST: 26 U/L (ref 0–37)
Albumin: 4.5 g/dL (ref 3.5–5.2)
Alkaline Phosphatase: 79 U/L (ref 39–117)
BUN: 14 mg/dL (ref 6–23)
CO2: 31 mEq/L (ref 19–32)
Calcium: 9.7 mg/dL (ref 8.4–10.5)
Chloride: 104 mEq/L (ref 96–112)
Creat: 0.79 mg/dL (ref 0.50–1.10)
Glucose, Bld: 94 mg/dL (ref 70–99)
Potassium: 4.3 mEq/L (ref 3.5–5.3)
Sodium: 141 mEq/L (ref 135–145)
Total Bilirubin: 0.4 mg/dL (ref 0.2–1.2)
Total Protein: 7.3 g/dL (ref 6.0–8.3)

## 2014-03-08 LAB — LIPID PANEL
Cholesterol: 171 mg/dL (ref 0–200)
HDL: 50 mg/dL (ref >39)
LDL Cholesterol: 108 mg/dL — ABNORMAL HIGH (ref 0–99)
Total CHOL/HDL Ratio: 3.4 Ratio
Triglycerides: 66 mg/dL (ref <150)
VLDL: 13 mg/dL (ref 0–40)

## 2014-03-08 LAB — POCT GLYCOSYLATED HEMOGLOBIN (HGB A1C): Hemoglobin A1C: 5.2

## 2014-03-08 MED ORDER — LISINOPRIL-HYDROCHLOROTHIAZIDE 20-25 MG PO TABS: 1.0000 | ORAL_TABLET | Freq: Every day | ORAL | Status: DC

## 2014-03-08 MED ORDER — METOCLOPRAMIDE HCL 10 MG PO TABS: ORAL_TABLET | ORAL | Status: DC

## 2014-03-08 MED ORDER — LEVOCETIRIZINE DIHYDROCHLORIDE 5 MG PO TABS: 5.0000 mg | ORAL_TABLET | Freq: Every evening | ORAL | Status: DC

## 2014-03-08 MED ORDER — OMEPRAZOLE 40 MG PO CPDR: DELAYED_RELEASE_CAPSULE | ORAL | Status: DC

## 2014-03-08 MED ORDER — ATORVASTATIN CALCIUM 20 MG PO TABS: 20.0000 mg | ORAL_TABLET | Freq: Every day | ORAL | Status: DC

## 2014-03-08 MED ORDER — PREDNISONE 20 MG PO TABS: ORAL_TABLET | ORAL | Status: DC

## 2014-03-08 MED ORDER — ALBUTEROL SULFATE HFA 108 (90 BASE) MCG/ACT IN AERS: 2.0000 | INHALATION_SPRAY | Freq: Four times a day (QID) | RESPIRATORY_TRACT | Status: DC | PRN

## 2014-03-08 NOTE — Patient Instructions (Signed)
Sciatica °Sciatica is pain, weakness, numbness, or tingling along the path of the sciatic nerve. The nerve starts in the lower back and runs down the back of each leg. The nerve controls the muscles in the lower leg and in the back of the knee, while also providing sensation to the back of the thigh, lower leg, and the sole of your foot. Sciatica is a symptom of another medical condition. For instance, nerve damage or certain conditions, such as a herniated disk or bone spur on the spine, pinch or put pressure on the sciatic nerve. This causes the pain, weakness, or other sensations normally associated with sciatica. Generally, sciatica only affects one side of the body. °CAUSES  °· Herniated or slipped disc. °· Degenerative disk disease. °· A pain disorder involving the narrow muscle in the buttocks (piriformis syndrome). °· Pelvic injury or fracture. °· Pregnancy. °· Tumor (rare). °SYMPTOMS  °Symptoms can vary from mild to very severe. The symptoms usually travel from the low back to the buttocks and down the back of the leg. Symptoms can include: °· Mild tingling or dull aches in the lower back, leg, or hip. °· Numbness in the back of the calf or sole of the foot. °· Burning sensations in the lower back, leg, or hip. °· Sharp pains in the lower back, leg, or hip. °· Leg weakness. °· Severe back pain inhibiting movement. °These symptoms may get worse with coughing, sneezing, laughing, or prolonged sitting or standing. Also, being overweight may worsen symptoms. °DIAGNOSIS  °Your caregiver will perform a physical exam to look for common symptoms of sciatica. He or she may ask you to do certain movements or activities that would trigger sciatic nerve pain. Other tests may be performed to find the cause of the sciatica. These may include: °· Blood tests. °· X-rays. °· Imaging tests, such as an MRI or CT scan. °TREATMENT  °Treatment is directed at the cause of the sciatic pain. Sometimes, treatment is not necessary  and the pain and discomfort goes away on its own. If treatment is needed, your caregiver may suggest: °· Over-the-counter medicines to relieve pain. °· Prescription medicines, such as anti-inflammatory medicine, muscle relaxants, or narcotics. °· Applying heat or ice to the painful area. °· Steroid injections to lessen pain, irritation, and inflammation around the nerve. °· Reducing activity during periods of pain. °· Exercising and stretching to strengthen your abdomen and improve flexibility of your spine. Your caregiver may suggest losing weight if the extra weight makes the back pain worse. °· Physical therapy. °· Surgery to eliminate what is pressing or pinching the nerve, such as a bone spur or part of a herniated disk. °HOME CARE INSTRUCTIONS  °· Only take over-the-counter or prescription medicines for pain or discomfort as directed by your caregiver. °· Apply ice to the affected area for 20 minutes, 3 4 times a day for the first 48 72 hours. Then try heat in the same way. °· Exercise, stretch, or perform your usual activities if these do not aggravate your pain. °· Attend physical therapy sessions as directed by your caregiver. °· Keep all follow-up appointments as directed by your caregiver. °· Do not wear high heels or shoes that do not provide proper support. °· Check your mattress to see if it is too soft. A firm mattress may lessen your pain and discomfort. °SEEK IMMEDIATE MEDICAL CARE IF:  °· You lose control of your bowel or bladder (incontinence). °· You have increasing weakness in the lower back,   pelvis, buttocks, or legs. °· You have redness or swelling of your back. °· You have a burning sensation when you urinate. °· You have pain that gets worse when you lie down or awakens you at night. °· Your pain is worse than you have experienced in the past. °· Your pain is lasting longer than 4 weeks. °· You are suddenly losing weight without reason. °MAKE SURE YOU: °· Understand these  instructions. °· Will watch your condition. °· Will get help right away if you are not doing well or get worse. °Document Released: 10/29/2001 Document Revised: 05/05/2012 Document Reviewed: 03/15/2012 °[employer]® Patient Information ©2014 [employer]. ° °

## 2014-03-08 NOTE — Progress Notes (Signed)
Subjective:  This chart was scribed for Hannah Haber, MD by Hannah Nguyen, [facility] Scribe. The patient was seen in room and the patient's care was started at 8:21 AM.  Patient ID: Hannah Nguyen, female    DOB: 02/05/1961, 53 y.o.   MRN: [id number]  Chief Complaint  Patient presents with   Follow-up    needing labs and refills lipitor, xyzal, prilosec,lisinopril, inhaler   due for mammogram    HPI HPI Comments: Hannah Nguyen is a 53 y.o. female who arrives to the [facility] for a follow up. Pt needs labs and refills for Lipitor, Xyzal,  Prilosec, Lisinopril, and  Ventolin inhaler. Denies taking Norco. Pt reports taking Reglan as needed.   Pt has intermittent, moderate left leg pain to the ventral aspect of her right thigh that shoots downwardly. When the pain presents, she has a cramping sensation and is able to visibly her muscles contract. She works in [employer] and stands on her feet for 8-9 hours a day.Pt was seen by the orthopedist around a few weeks ago and was told to wear specialized shoes. Pt denies noticing any significant improvements of symptoms after wearing the shoes.  She states some nights having trouble sleeping due to leg pain. The leg pain is also worse with cold sensations. Pt regularly wears bifocals and is unsure of any visual changes. She explain she injured her back, years ago boxes fell onto her back resulting in a bulging disc. Pt was treated with Prednisone and experienced moderate relief.   She mentions having a significant family hx of DM and would like to be screened.  Past Medical History  Diagnosis Date   Hypertension    Elevated cholesterol    GERD (gastroesophageal reflux disease)    Seizures     2 months ago was last episode of uncontrollable rt arm motions and passed out   Nephrolithiasis    Migraine    Other and unspecified hyperlipidemia    Past Surgical History  Procedure  Laterality Date   Lithotripsy     Cesarean section      x2   Knee arthroscopy      x2   Thumb surg     Robotic assisted lap vaginal hysterectomy  10/25/2011    Procedure: ROBOTIC ASSISTED LAPAROSCOPIC VAGINAL HYSTERECTOMY;  Surgeon: Hannah Lawrence, MD;  Location: [facility];  Service: Gynecology;  Laterality: N/A;  Marsupralization og bartholin gland, cyst biopsy   Current outpatient prescriptions:albuterol (PROVENTIL HFA;VENTOLIN HFA) 108 (90 BASE) MCG/ACT inhaler, Inhale 2 puffs into the lungs every 6 (six) hours as needed for wheezing., Disp: 1 Inhaler, Rfl: 1;  atorvastatin (LIPITOR) 20 MG tablet, Take 1 tablet (20 mg total) by mouth daily after supper., Disp: 30 tablet, Rfl: 6;  GAVILYTE-G 236 G solution, , Disp: , Rfl:  Levetiracetam (KEPPRA XR) 750 MG TB24, Take 1 tablet (750 mg total) by mouth at bedtime., Disp: 30 tablet, Rfl: 12;  levETIRAcetam (KEPPRA) 500 MG tablet, TAKE  TABLET BY MOUTH TWICE DAILY, Disp: 60 tablet, Rfl: 0;  levocetirizine (XYZAL) 5 MG tablet, Take 1 tablet (5 mg total) by mouth every evening., Disp: 30 tablet, Rfl: 11 lisinopril-hydrochlorothiazide (PRINZIDE,ZESTORETIC) 20-25 MG per tablet, Take 1 tablet by mouth daily. PATIENT NEEDS OFFICE VISIT FOR ADDITIONAL REFILLS, Disp: 30 tablet, Rfl: 0;  Multiple Vitamins-Minerals (MULTIVITAMINS THER. W/MINERALS) TABS, Take 1 tablet by mouth daily.  , Disp: , Rfl: ;  omeprazole (PRILOSEC) 40 MG  capsule, Take one daily to suppress stomach acid, Disp: 30 capsule, Rfl: 0 fluticasone (FLONASE) 50 MCG/ACT nasal spray, Place 2 sprays into the nose daily., Disp: 16 g, Rfl: 6;  HYDROcodone-acetaminophen (NORCO) 7.5-325 MG per tablet, Take 7.5 tablets by mouth as needed., Disp: , Rfl: ;  ibuprofen (ADVIL,MOTRIN) 800 MG tablet, Take 800 mg by mouth daily., Disp: , Rfl: ;  metoCLOPramide (REGLAN) 10 MG tablet, Take one every 6 or 8 hours as needed for belching and nausea, Disp: 20 tablet, Rfl: 0 ondansetron (ZOFRAN ODT) 4 MG  disintegrating tablet, Take one every 6 hours if needed for nausea or vomiting, Disp: 10 tablet, Rfl: 0;  penicillin v potassium (VEETID) 250 MG tablet, , Disp: , Rfl:  Allergies  Allergen Reactions   Vicodin [Hydrocodone-Acetaminophen] Hives and Itching     Review of Systems  Musculoskeletal: Positive for arthralgias, gait problem and myalgias. Negative for back pain.  Psychiatric/Behavioral: Positive for sleep disturbance.       Objective:   Physical Exam No acute distress, pleasant and articulate HEENT: Unremarkable Neck: Supple no adenopathy or JVD Chest: Clear Heart: Regular no murmur Abdomen: Soft nontender Extremities: No edema, full range of motion of left hip Neurological: Positive femoral stretch test on the left, negative straight leg raising, Skin: No rash or suspicious lesions  Discussed course of care with pt which includes medication refill and laboratory tests. Pt understands and agrees.  Results for orders placed in visit on 03/08/14  POCT CBC      Result Value Ref Range   WBC 6.3  4.6 - 10.2 K/uL   Lymph, poc 2.3  0.6 - 3.4   POC LYMPH PERCENT 37.0  10 - 50 %L   MID (cbc) 0.4  0 - 0.9   POC MID % 5.8  0 - 12 %M   POC Granulocyte 3.6  2 - 6.9   Granulocyte percent 57.2  37 - 80 %G   RBC 4.35  4.04 - 5.48 M/uL   Hemoglobin 13.6  12.2 - 16.2 g/dL   HCT, POC 42.7  37.7 - 47.9 %   MCV 98.2 (*) 80 - 97 fL   MCH, POC 31.3 (*) 27 - 31.2 pg   MCHC 31.9  31.8 - 35.4 g/dL   RDW, POC 14.3     Platelet Count, POC 292  142 - 424 K/uL   MPV 10.1  0 - 99.8 fL  POCT GLYCOSYLATED HEMOGLOBIN (HGB A1C)      Result Value Ref Range   Hemoglobin A1C 5.2           Assessment & Plan:  Patient seems to be doing further well. The persistent thigh pain with a positive femoral stretch test suggest an L4 disc. Otherwise things are going fairly well.    Thigh pain - Plan: POCT CBC, predniSONE (DELTASONE) 20 MG tablet  Hyperlipemia - Plan: POCT glycosylated hemoglobin  (Hb A1C), Lipid panel, Comprehensive metabolic panel  Abdominal pain, left upper quadrant - Plan: omeprazole (PRILOSEC) 40 MG capsule  Allergic rhinitis - Plan: levocetirizine (XYZAL) 5 MG tablet  Bronchitis, allergic - Plan: albuterol (PROVENTIL HFA;VENTOLIN HFA) 108 (90 BASE) MCG/ACT inhaler  Cough - Plan: albuterol (PROVENTIL HFA;VENTOLIN HFA) 108 (90 BASE) MCG/ACT inhaler  Nausea alone - Plan: metoCLOPramide (REGLAN) 10 MG tablet  Burping - Plan: metoCLOPramide (REGLAN) 10 MG tablet  HTN (hypertension) - Plan: lisinopril-hydrochlorothiazide (PRINZIDE,ZESTORETIC) 20-25 MG per tablet  Other and unspecified hyperlipidemia - Plan: atorvastatin (LIPITOR) 20 MG tablet   Signed,  Hannah Haber, MD

## 2014-03-09 ENCOUNTER — Other Ambulatory Visit: Payer: Self-pay

## 2014-03-09 DIAGNOSIS — Z1231 Encounter for screening mammogram for malignant neoplasm of breast: Secondary | ICD-10-CM

## 2014-03-16 ENCOUNTER — Encounter: Payer: Self-pay | Admitting: Nurse Practitioner

## 2014-03-16 ENCOUNTER — Ambulatory Visit
Admission: RE | Admit: 2014-03-16 | Discharge: 2014-03-16 | Disposition: A | Discharge status: Home / Self Care | Payer: Federal, State, Local not specified - PPO | Source: Ambulatory Visit

## 2014-03-16 ENCOUNTER — Ambulatory Visit (INDEPENDENT_AMBULATORY_CARE_PROVIDER_SITE_OTHER): Payer: Federal, State, Local not specified - PPO | Admitting: Nurse Practitioner

## 2014-03-16 VITALS — BP 108/66 | HR 77 | Ht 65.5 in | Wt 174.0 lb

## 2014-03-16 DIAGNOSIS — R569 Unspecified convulsions: Secondary | ICD-10-CM

## 2014-03-16 DIAGNOSIS — Z1231 Encounter for screening mammogram for malignant neoplasm of breast: Secondary | ICD-10-CM

## 2014-03-16 DIAGNOSIS — Z5181 Encounter for therapeutic drug level monitoring: Secondary | ICD-10-CM

## 2014-03-16 NOTE — Progress Notes (Signed)
[facility]  PATIENT: Hannah Nguyen DOB: [birth year]   REASON FOR VISIT: Followup for seizure   HISTORY OF PRESENT ILLNESS: Hannah Nguyen, 53 year old female returns for followup. She has a history of seizure disorder. She was last seen by Dr. Krista Blue 10/07/2013 ,she was placed on extended release Keppra and the patient claims she had side effects to the medication. She called into the office and did not get a response therefore she went back to taking her 500 mg of Keppra at night only. No seizure events in a couple of years. Appetite is reportedly good,  she does have some difficulty sleeping at times due to muscle pain in her left thigh.  She is due to see a orthopedist. She had a recent CBC and CMP by primary care which was reviewed. She was made aware she's taking suboptimal dose of Keppra. She returns for reevaluation.  HISTORY: She has past medical history of hypertension, hyperlipidemia, migraine headaches, presenting with passing out episode in January 29, 2010.  she was with her family at home, after watching TV for a while in January 29 2010, she decided to fix something for her child, she remember walking to the kitchen, the next thing, she woke up on the floor, EMS were there, all rounding her, she apparently had a passout, there was no evidence she had hurt herself, no evidence of trauma, urinary incontinence, there was no tongue biting. Family started trying to arouse her by rubbing her hands, shaking her, 3-5 minutes before she was able to be aroused completely, but that time EMS has arrived.  She was admitted to hospital on January 29 2010, during her first episode, she was started on Keppra 500 mg twice a day, but she is not taking it regularly. Echocardiogram showed ejection fraction 60-65%, EKG and telemetry monitoring was normal, CT scan of the head was normal.  Since the initial episode, she had episode of uncontrollable right arm shaking, lasting few minutes,  without loss of consciousness, she often feel hot, with elevated blood pressure during the episode, this can happen 3 times or evenl more times in a week.  Since Keppra was started, she has much less recurrent episode, and denies any since last seen 08/07/11. Her episode in the past described as stereotypical intermittent right arm uncontrollable shaking, lasting 2-3 minutes, not under her voluntary controlled, no associated right face or right lower extremity shaking. Keppra make her drowsy. EEG showed intermittent right central slowing, occasionally right P4 sharp transient. MRI of the brain has demonstrated supratentorial small vessel disease,atrophy more than expected for her age.  UPDATE 10/07/2013:  She is taking Keppra 250 in the morning, 500 every night, there was no recurrent events since last visit in September 2013, she complains of left-sided low back pain, radiating pain to her left leg     REVIEW OF SYSTEMS: Full 14 system review of systems performed and notable only for those listed, all others are neg:  Constitutional: Fatigue Cardiovascular: N/A  Ear/Nose/Throat: N/A  Skin: N/A  Eyes: N/A  Respiratory: N/A  Gastroitestinal: N/A  Hematology/Lymphatic: N/A  Endocrine: N/A Musculoskeletal: Muscle cramps  Allergy/Immunology: N/A  Neurological: N/A Psychiatric: N/A   ALLERGIES: Allergies  Allergen Reactions  . Vicodin [Hydrocodone-Acetaminophen] Hives and Itching    HOME MEDICATIONS: Outpatient Prescriptions Prior to Visit  Medication Sig Dispense Refill  . albuterol (PROVENTIL HFA;VENTOLIN HFA) 108 (90 BASE) MCG/ACT inhaler Inhale 2 puffs into the lungs every 6 (six) hours as needed for wheezing.  1 Inhaler  1  . atorvastatin (LIPITOR) 20 MG tablet Take 1 tablet (20 mg total) by mouth daily after supper.  30 tablet  6  . GAVILYTE-G 236 G solution       . ibuprofen (ADVIL,MOTRIN) 800 MG tablet Take 800 mg by mouth daily.      . Levetiracetam (KEPPRA XR) 750 MG TB24 Take  1 tablet (750 mg total) by mouth at bedtime.  30 tablet  12  . levETIRAcetam (KEPPRA) 500 MG tablet TAKE  TABLET BY MOUTH TWICE DAILY  60 tablet  0  . levocetirizine (XYZAL) 5 MG tablet Take 1 tablet (5 mg total) by mouth every evening.  30 tablet  11  . lisinopril-hydrochlorothiazide (PRINZIDE,ZESTORETIC) 20-25 MG per tablet Take 1 tablet by mouth daily. PATIENT NEEDS OFFICE VISIT FOR ADDITIONAL REFILLS  90 tablet  3  . metoCLOPramide (REGLAN) 10 MG tablet Take one every 6 or 8 hours as needed for belching and nausea  20 tablet  0  . Multiple Vitamins-Minerals (MULTIVITAMINS THER. W/MINERALS) TABS Take 1 tablet by mouth daily.        Marland Kitchen omeprazole (PRILOSEC) 40 MG capsule Take one daily to suppress stomach acid  30 capsule  0  . predniSONE (DELTASONE) 20 MG tablet 2 daily with food  10 tablet  1  . fluticasone (FLONASE) 50 MCG/ACT nasal spray Place 2 sprays into the nose daily.  16 g  6   No facility-administered medications prior to visit.    PAST MEDICAL HISTORY: Past Medical History  Diagnosis Date  . Hypertension   . Elevated cholesterol   . GERD (gastroesophageal reflux disease)   . Seizures     2 months ago was last episode of uncontrollable rt arm motions and passed out  . Nephrolithiasis   . Migraine   . Other and unspecified hyperlipidemia     PAST SURGICAL HISTORY: Past Surgical History  Procedure Laterality Date  . Lithotripsy    . Cesarean section      x2  . Knee arthroscopy      x2  . Thumb surg    . Robotic assisted lap vaginal hysterectomy  10/25/2011    Procedure: ROBOTIC ASSISTED LAPAROSCOPIC VAGINAL HYSTERECTOMY;  Surgeon: Agnes Lawrence, MD;  Location: [facility];  Service: Gynecology;  Laterality: N/A;  Marsupralization og bartholin gland, cyst biopsy    FAMILY HISTORY: History reviewed. No pertinent family history.  SOCIAL HISTORY: History   Social History  . Marital Status: Married    Spouse Name: Owens Shark     Number of Children: 2  . Years of  Education: 13   Occupational History  . [employer]   Social History Main Topics  . Smoking status: Never Smoker   . Smokeless tobacco: Never Used  . Alcohol Use: No  . Drug Use: No  . Sexual Activity: Not on file   Other Topics Concern  . Not on file   Social History Narrative   Patient lives at home with her husband    Patient works full time.   Right handed.    Caffeine- three sodas daily.     PHYSICAL EXAM  Filed Vitals:   03/16/14 0906  BP: 108/66  Pulse: 77  Height: 5' 5.5" (1.664 m)  Weight: 174 lb (78.926 kg)   Body mass index is 28.5 kg/(m^2).  Generalized: Well developed, in no acute distress  Head: normocephalic and atraumatic,. Oropharynx benign  Neck: Supple, no carotid bruits  Cardiac: Regular rate rhythm, no  murmur  Musculoskeletal: No deformity   Neurological examination   Mentation: Alert oriented to time, place, history taking. Follows all commands speech and language fluent  Cranial nerve II-XII: Pupils were equal round reactive to light extraocular movements were full, visual field were full on confrontational test. Facial sensation and strength were normal. hearing was intact to finger rubbing bilaterally. Uvula tongue midline. head turning and shoulder shrug were normal and symmetric.Tongue protrusion into cheek strength was normal. Motor: normal bulk and tone, full strength in the BUE, BLE, fine finger movements normal, no pronator drift. No focal weakness Coordination: finger-nose-finger, heel-to-shin bilaterally, no dysmetria Reflexes: Brachioradialis 2/2, biceps 2/2, triceps 2/2, patellar 2/2, Achilles 2/2, plantar responses were flexor bilaterally. Gait and Station: Rising up from seated position without assistance, normal stance,  moderate stride, good arm swing, smooth turning, able to perform tiptoe, and heel walking without difficulty. Tandem gait is steady  DIAGNOSTIC DATA (LABS, IMAGING, TESTING) - I reviewed patient  records, labs, notes, testing and imaging myself where available.  Lab Results  Component Value Date   WBC 6.3 03/08/2014   HGB 13.6 03/08/2014   HCT 42.7 03/08/2014   MCV 98.2* 03/08/2014   PLT 284 06/29/2013      Component Value Date/Time   NA 141 03/08/2014 0837   K 4.3 03/08/2014 0837   CL 104 03/08/2014 0837   CO2 31 03/08/2014 0837   GLUCOSE 94 03/08/2014 0837   BUN 14 03/08/2014 0837   CREATININE 0.79 03/08/2014 0837   CREATININE 0.66 10/26/2011 0349   CALCIUM 9.7 03/08/2014 0837   PROT 7.3 03/08/2014 0837   ALBUMIN 4.5 03/08/2014 0837   AST 26 03/08/2014 0837   ALT 32 03/08/2014 0837   ALKPHOS 79 03/08/2014 0837   BILITOT 0.4 03/08/2014 0837   GFRNONAA >90 10/26/2011 0349   GFRAA >90 10/26/2011 0349   Lab Results  Component Value Date   CHOL 171 03/08/2014   HDL 50 03/08/2014   LDLCALC 108* 03/08/2014   TRIG 66 03/08/2014   CHOLHDL 3.4 03/08/2014   Lab Results  Component Value Date   HGBA1C 5.2 03/08/2014    Lab Results  Component Value Date   TSH 0.72 06/29/2013      ASSESSMENT AND PLAN  53 y.o. 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 Patient was on extended release Keppra and due to side effects stopped the medication. She is now taking 250 or 500 at night. No seizure in several years. Previous MRI of the brain showing diffuse atrophy and small vessel disease.   Check Keppra level Will adjust medications after level is back Followup in 6 months Dennie Bible, [facility], [facility], APRN  [facility] [address], [place], [place] [address] [phone]

## 2014-03-16 NOTE — Patient Instructions (Signed)
Check Keppra level Will adjust medications after level is back Followup in 6 months

## 2014-03-18 LAB — LEVETIRACETAM LEVEL: LEVETIRACETAM: 1.9 ug/mL — AB (ref 5.0–63.0)

## 2014-04-05 ENCOUNTER — Other Ambulatory Visit: Payer: Self-pay | Admitting: Neurology

## 2014-04-05 NOTE — Telephone Encounter (Signed)
Notes Recorded by Dennie Bible, NP on 03/18/2014 at 12:39 PM Paient called and made aware she needs to get back on her previous dose of Keppra, 250mg  am, 500mg  pm.

## 2014-04-19 ENCOUNTER — Ambulatory Visit: Payer: Federal, State, Local not specified - PPO | Admitting: Emergency Medicine

## 2014-06-24 ENCOUNTER — Ambulatory Visit: Payer: Federal, State, Local not specified - PPO

## 2014-07-05 ENCOUNTER — Ambulatory Visit (INDEPENDENT_AMBULATORY_CARE_PROVIDER_SITE_OTHER): Payer: Federal, State, Local not specified - PPO

## 2014-07-05 VITALS — BP 124/75 | HR 74 | Resp 15 | Ht 65.0 in | Wt 180.0 lb

## 2014-07-05 DIAGNOSIS — M79672 Pain in left foot: Secondary | ICD-10-CM

## 2014-07-05 DIAGNOSIS — M79609 Pain in unspecified limb: Secondary | ICD-10-CM

## 2014-07-05 DIAGNOSIS — M722 Plantar fascial fibromatosis: Secondary | ICD-10-CM

## 2014-07-05 DIAGNOSIS — M7730 Calcaneal spur, unspecified foot: Secondary | ICD-10-CM

## 2014-07-05 MED ORDER — TRIAMCINOLONE ACETONIDE 10 MG/ML IJ SUSP: 10.0000 mg | Freq: Once | INTRAMUSCULAR | Status: DC

## 2014-07-05 NOTE — Patient Instructions (Signed)

## 2014-07-05 NOTE — Progress Notes (Signed)
   Subjective:    Patient ID: Hannah Nguyen, female    DOB: 03-28-1961, 53 y.o.   MRN: [id number]  HPI Comments: N heel pain L left heel pain D 1 month O multiple episodes C sharp pain A weight bearing T taping, change to more supportive shoe, pt's orthopedic doctor states may be coming from a back problem      Review of Systems  Constitutional: Positive for fatigue.  Neurological: Positive for tremors and seizures.  All other systems reviewed and are negative.      Objective:   Physical Exam 53 year old African female presents this time with a complaint inferior left heel and arch pain. Painful first step or getting up appeared dressed patient has some back issues however is been having burning and shooting or nervelike symptoms these are more pain on first up pain with activities ambulation and on palpation of the heel. Has had previous steroid injection and changing shoes years ago for similar problem left foot seems to have recurred also is having some tenderness in the right foot she was aware of at this time.  Lower extremity objective findings reveal pedal pulses palpable DP postal for PT one over 4 PT Refill time 3 seconds epicritic and proprioceptive sensations intact and symmetric bilateral there is normal plantar response DTRs not listed neurologically skin color pigment normal hair growth absent nails somewhat criptotic otherwise unremarkable orthopedic biomechanical exam reveals pain on palpation medial band plantar fascia medial calcaneal tubercle bilateral of the left significant worse than the right x-rays confirm well-developed inferior calcaneal spur on the left heel. No signs of fracture fascia thickening is also identified. Remainder of exam is otherwise unremarkable patient wearing relatively good Athletic shoes although lately slightly worn and likely need replacing. Remainder of exam is otherwise unremarkable at this time patient benefit from some followup  including treatment for the active symptoms intervention of recurrence again the future.       Assessment & Plan:  Assessment plantar fasciitis/heel spur syndrome left and beginning C. on the right foot. This time injection tendons:: Xylocaine plain infiltrated the inferior left heel orthotics skin is carried out on both feet for new functional orthoses which patient will followup with in or 4 weeks for orthotic fitting and dispensing. Also at this time fascial strapping reapplied the left foot maintain ice and OTC Advil or ibuprofen as needed for pain recheck in 4 weeks for followup  Harriet Masson DPM

## 2014-07-26 ENCOUNTER — Ambulatory Visit: Payer: Federal, State, Local not specified - PPO

## 2014-07-26 DIAGNOSIS — M722 Plantar fascial fibromatosis: Secondary | ICD-10-CM

## 2014-07-26 NOTE — Patient Instructions (Addendum)

## 2014-07-26 NOTE — Progress Notes (Signed)
Pt is here to PUO 

## 2014-09-02 ENCOUNTER — Other Ambulatory Visit: Payer: Self-pay

## 2014-09-06 ENCOUNTER — Ambulatory Visit: Payer: Federal, State, Local not specified - PPO

## 2014-09-07 ENCOUNTER — Telehealth: Payer: Self-pay | Admitting: *Deleted

## 2014-09-07 NOTE — Telephone Encounter (Signed)
Left patient a message that her appointment date and time was the same and instead of seeing cm she would be seeing mm due to a change in schedule.

## 2014-09-15 ENCOUNTER — Ambulatory Visit: Payer: Self-pay | Admitting: Adult Health

## 2014-09-15 NOTE — Telephone Encounter (Signed)
Patient scheduled.

## 2014-09-20 ENCOUNTER — Ambulatory Visit (INDEPENDENT_AMBULATORY_CARE_PROVIDER_SITE_OTHER): Payer: Federal, State, Local not specified - PPO | Admitting: Adult Health

## 2014-09-20 ENCOUNTER — Encounter: Payer: Self-pay | Admitting: Adult Health

## 2014-09-20 VITALS — BP 120/70 | HR 75 | Ht 66.75 in | Wt 180.0 lb

## 2014-09-20 DIAGNOSIS — R569 Unspecified convulsions: Secondary | ICD-10-CM

## 2014-09-20 MED ORDER — LEVETIRACETAM 500 MG PO TABS: ORAL_TABLET | ORAL | Status: DC

## 2014-09-20 NOTE — Patient Instructions (Signed)
Seizure, Adult A seizure means there is unusual activity in the brain. A seizure can cause changes in attention or behavior. Seizures often cause shaking (convulsions). Seizures often last from 30 seconds to 2 minutes. HOME CARE   If you are given medicines, take them exactly as told by your doctor.  Keep all doctor visits as told.  Do not swim or drive until your doctor says it is okay.  Teach others what to do if you have a seizure. They should:  Lay you on the ground.  Put a cushion under your head.  Loosen any tight clothing around your neck.  Turn you on your side.  Stay with you until you get better. GET HELP RIGHT AWAY IF:   The seizure lasts longer than 2 to 5 minutes.  The seizure is very bad.  The person does not wake up after the seizure.  The person's attention or behavior changes. Drive the person to the emergency room or call your local emergency services (911 in [place] MAKE SURE YOU:   Understand these instructions.  Will watch your condition.  Will get help right away if you are not doing well or get worse. Document Released: 04/22/2008 Document Revised: 01/27/2012 Document Reviewed: 10/23/2011 ExitCare Patient Information 2015 [employer]. This information is not intended to replace advice given to you by your health care provider. Make sure you discuss any questions you have with your health care provider.  

## 2014-09-20 NOTE — Progress Notes (Signed)
PATIENT: Hannah Nguyen DOB: 1961-04-07  REASON FOR VISIT: follow up HISTORY FROM: patient  HISTORY OF PRESENT ILLNESS: Hannah Nguyen is a 53 year old female with a history of migraines. She returns today for follow-up. She is currently taking Keppra 750mg  daily- 500 mg in the PM and 250 mg in the AM. She reports that her last seizure was almost two years ago. She operates a Teacher, music without difficulty. At home is can complete all ADLs independently. No new neurological complaints. She reports that she was told she has stenosis in the lumbar region at L4-L5. She is not having significant issues with this now but if it worsens then her orthopedic doctor will start epidural steroid injections.   History 03/16/14 (CM): 53 year old female returns for followup. She has a history of seizure disorder. She was last seen by Dr. Krista Blue 10/07/2013 ,she was placed on extended release Keppra and the patient claims she had side effects to the medication. She called into the office and did not get a response therefore she went back to taking her 500 mg of Keppra at night only. No seizure events in a couple of years. Appetite is reportedly good,  she does have some difficulty sleeping at times due to muscle pain in her left thigh.  She is due to see a orthopedist. She had a recent CBC and CMP by primary care which was reviewed. She was made aware she's taking suboptimal dose of Keppra. She returns for reevaluation. HISTORY (YY): She has past medical history of hypertension, hyperlipidemia, migraine headaches, presenting with passing out episode in January 29, 2010.   she was with her family at home, after watching TV for a while in January 29 2010, she decided to fix something for her child, she remember walking to the kitchen, the next thing, she woke up on the floor, EMS were there, all rounding her, she apparently had a passout, there was no evidence she had hurt herself, no evidence of trauma, urinary  incontinence, there was no tongue biting. Family started trying to arouse her by rubbing her hands, shaking her, 3-5 minutes before she was able to be aroused completely, but that time EMS has arrived.   She was admitted to hospital on January 29 2010, during her first episode, she was started on Keppra 500 mg twice a day, but she is not taking it regularly. Echocardiogram showed ejection fraction 60-65%, EKG and telemetry monitoring was normal, CT scan of the head was normal.   Since the initial episode, she had episode of uncontrollable right arm shaking, lasting few minutes, without loss of consciousness, she often feel hot, with elevated blood pressure during the episode, this can happen 3 times or evenl more times in a week.   Since Keppra was started, she has much less recurrent episode, and denies any since last seen 08/07/11. Her episode in the past described as stereotypical intermittent right arm uncontrollable shaking, lasting 2-3 minutes, not under her voluntary controlled, no associated right face or right lower extremity shaking. Keppra make her drowsy. EEG showed intermittent right central slowing, occasionally right P4 sharp transient. MRI of the brain has demonstrated supratentorial small vessel disease,atrophy more than expected for her age.   UPDATE 10/07/2013:   She is taking Keppra 250 in the morning, 500 every night, there was no recurrent events since last visit in September 2013, she complains of left-sided low back pain, radiating pain to her left leg    REVIEW OF SYSTEMS:  Full 14 system review of systems performed and notable only for:  Constitutional: fatigue Eyes: N/A Ear/Nose/Throat: N/A  Skin: N/A  Cardiovascular: N/A  Respiratory: N/A  Gastrointestinal: N/A  Genitourinary: frequency of urination Hematology/Lymphatic: N/A  Endocrine: cold and heat intolerance Musculoskeletal:N/A  Allergy/Immunology: N/A  Neurological: N/A Psychiatric: N/A Sleep:  N/A   ALLERGIES: Allergies  Allergen Reactions  . Vicodin [Hydrocodone-Acetaminophen] Hives and Itching    HOME MEDICATIONS: Outpatient Prescriptions Prior to Visit  Medication Sig Dispense Refill  . atorvastatin (LIPITOR) 20 MG tablet Take 1 tablet (20 mg total) by mouth daily after supper. 30 tablet 6  . levETIRAcetam (KEPPRA) 500 MG tablet 250mg  in am, 500mg  in  pm. 45 tablet 6  . levocetirizine (XYZAL) 5 MG tablet Take 1 tablet (5 mg total) by mouth every evening. 30 tablet 11  . lisinopril-hydrochlorothiazide (PRINZIDE,ZESTORETIC) 20-25 MG per tablet Take 1 tablet by mouth daily. PATIENT NEEDS OFFICE VISIT FOR ADDITIONAL REFILLS 90 tablet 3  . Multiple Vitamins-Minerals (MULTIVITAMINS THER. W/MINERALS) TABS Take 1 tablet by mouth daily.      Marland Kitchen albuterol (PROVENTIL HFA;VENTOLIN HFA) 108 (90 BASE) MCG/ACT inhaler Inhale 2 puffs into the lungs every 6 (six) hours as needed for wheezing. 1 Inhaler 1  . fluticasone (FLONASE) 50 MCG/ACT nasal spray Place 2 sprays into the nose daily. 16 g 6   Facility-Administered Medications Prior to Visit  Medication Dose Route Frequency Provider Last Rate Last Dose  . triamcinolone acetonide (KENALOG) 10 MG/ML injection 10 mg  10 mg Other Once Harriet Masson, DPM        PAST MEDICAL HISTORY: Past Medical History  Diagnosis Date  . Hypertension   . Elevated cholesterol   . GERD (gastroesophageal reflux disease)   . Seizures     2 months ago was last episode of uncontrollable rt arm motions and passed out  . Nephrolithiasis   . Migraine   . Other and unspecified hyperlipidemia     PAST SURGICAL HISTORY: Past Surgical History  Procedure Laterality Date  . Lithotripsy    . Cesarean section      x2  . Knee arthroscopy      x2  . Thumb surg    . Robotic assisted lap vaginal hysterectomy  10/25/2011    Procedure: ROBOTIC ASSISTED LAPAROSCOPIC VAGINAL HYSTERECTOMY;  Surgeon: Agnes Lawrence, MD;  Location: [facility];  Service: Gynecology;   Laterality: N/A;  Marsupralization og bartholin gland, cyst biopsy    FAMILY HISTORY: History reviewed. No pertinent family history.  SOCIAL HISTORY: History   Social History  . Marital Status: Married    Spouse Name: Owens Shark     Number of Children: 2  . Years of Education: 13   Occupational History  . [employer]   Social History Main Topics  . Smoking status: Never Smoker   . Smokeless tobacco: Never Used  . Alcohol Use: No  . Drug Use: No  . Sexual Activity: Not on file   Other Topics Concern  . Not on file   Social History Narrative   Patient lives at home with her husband    Patient works full time.   Right handed.    Caffeine- three sodas daily.      PHYSICAL EXAM  Filed Vitals:   09/20/14 0936  BP: 120/70  Pulse: 75  Height: 5' 6.75" (1.695 m)  Weight: 180 lb (81.647 kg)   Body mass index is 28.42 kg/(m^2).  Generalized: Well developed, in no acute distress   Neurological  examination  Mentation: Alert oriented to time, place, history taking. Follows all commands speech and language fluent Cranial nerve II-XII: Pupils were equal round reactive to light. Extraocular movements were full, visual field were full on confrontational test. Facial sensation and strength were normal. Uvula tongue midline. Head turning and shoulder shrug  were normal and symmetric. Motor: The motor testing reveals 5 over 5 strength of all 4 extremities. Good symmetric motor tone is noted throughout.  Sensory: Sensory testing is intact to soft touch on all 4 extremities. No evidence of extinction is noted.  Coordination: Cerebellar testing reveals good finger-nose-finger and heel-to-shin bilaterally.  Gait and station: Gait is normal. Tandem gait is normal. Romberg is negative. No drift is seen.  Reflexes: Deep tendon reflexes are symmetric and normal bilaterally.    DIAGNOSTIC DATA (LABS, IMAGING, TESTING) - I reviewed patient records, labs, notes, testing and  imaging myself where available.  Lab Results  Component Value Date   WBC 6.3 03/08/2014   HGB 13.6 03/08/2014   HCT 42.7 03/08/2014   MCV 98.2* 03/08/2014   PLT 284 06/29/2013      Component Value Date/Time   NA 141 03/08/2014 0837   K 4.3 03/08/2014 0837   CL 104 03/08/2014 0837   CO2 31 03/08/2014 0837   GLUCOSE 94 03/08/2014 0837   BUN 14 03/08/2014 0837   CREATININE 0.79 03/08/2014 0837   CREATININE 0.66 10/26/2011 0349   CALCIUM 9.7 03/08/2014 0837   PROT 7.3 03/08/2014 0837   ALBUMIN 4.5 03/08/2014 0837   AST 26 03/08/2014 0837   ALT 32 03/08/2014 0837   ALKPHOS 79 03/08/2014 0837   BILITOT 0.4 03/08/2014 0837   GFRNONAA >90 10/26/2011 0349   GFRAA >90 10/26/2011 0349   Lab Results  Component Value Date   CHOL 171 03/08/2014   HDL 50 03/08/2014   LDLCALC 108* 03/08/2014   TRIG 66 03/08/2014   CHOLHDL 3.4 03/08/2014   Lab Results  Component Value Date   HGBA1C 5.2 03/08/2014   No results found for: [id number] Lab Results  Component Value  Date   TSH 0.72 06/29/2013      ASSESSMENT AND PLAN 53 y.o. year old female  has a past medical history of Hypertension; Elevated cholesterol; GERD (gastroesophageal reflux disease); Seizures; Nephrolithiasis; Migraine; and Other and unspecified hyperlipidemia. here with:  1. seizures  Seizures are controlled with Keppra She will continue Keppra- 250 mg in the AM and 500 mg in the PM, I will refill today If she has any additional seizures she should let us know.  She will follow-up in 6 months or sooner if needed.    Ward Givens, MSN, NP-C 09/20/2014, 9:46 AM [facility] [address] [place], [place] [address] [phone]  Note: This document was prepared with digital dictation and possible smart phrase technology. Any transcriptional errors that result from this process are unintentional.

## 2014-10-11 ENCOUNTER — Ambulatory Visit: Payer: Federal, State, Local not specified - PPO | Admitting: Nurse Practitioner

## 2014-11-18 DIAGNOSIS — M4800 Spinal stenosis, site unspecified: Secondary | ICD-10-CM

## 2014-11-18 HISTORY — DX: Spinal stenosis, site unspecified: M48.00

## 2014-12-27 ENCOUNTER — Encounter: Payer: Self-pay | Admitting: Emergency Medicine

## 2014-12-27 ENCOUNTER — Ambulatory Visit (INDEPENDENT_AMBULATORY_CARE_PROVIDER_SITE_OTHER): Payer: BLUE CROSS/BLUE SHIELD | Admitting: Emergency Medicine

## 2014-12-27 VITALS — BP 148/90 | HR 77 | Temp 98.0°F | Resp 16 | Ht 65.5 in | Wt 173.2 lb

## 2014-12-27 DIAGNOSIS — E785 Hyperlipidemia, unspecified: Secondary | ICD-10-CM

## 2014-12-27 DIAGNOSIS — J302 Other seasonal allergic rhinitis: Secondary | ICD-10-CM

## 2014-12-27 DIAGNOSIS — J0100 Acute maxillary sinusitis, unspecified: Secondary | ICD-10-CM

## 2014-12-27 DIAGNOSIS — I10 Essential (primary) hypertension: Secondary | ICD-10-CM

## 2014-12-27 LAB — CBC WITH DIFFERENTIAL/PLATELET
BASOS PCT: 1 % (ref 0–1)
Basophils Absolute: 0.1 10*3/uL (ref 0.0–0.1)
Eosinophils Absolute: 0.1 10*3/uL (ref 0.0–0.7)
Eosinophils Relative: 2 % (ref 0–5)
HCT: 41.9 % (ref 36.0–46.0)
HEMOGLOBIN: 14.2 g/dL (ref 12.0–15.0)
Lymphocytes Relative: 40 % (ref 12–46)
Lymphs Abs: 2.2 10*3/uL (ref 0.7–4.0)
MCH: 31.4 pg (ref 26.0–34.0)
MCHC: 33.9 g/dL (ref 30.0–36.0)
MCV: 92.7 fL (ref 78.0–100.0)
MONO ABS: 0.3 10*3/uL (ref 0.1–1.0)
MONOS PCT: 5 % (ref 3–12)
MPV: 10.9 fL (ref 8.6–12.4)
Neutro Abs: 2.9 10*3/uL (ref 1.7–7.7)
Neutrophils Relative %: 52 % (ref 43–77)
Platelets: 301 10*3/uL (ref 150–400)
RBC: 4.52 MIL/uL (ref 3.87–5.11)
RDW: 13.4 % (ref 11.5–15.5)
WBC: 5.6 10*3/uL (ref 4.0–10.5)

## 2014-12-27 MED ORDER — LEVOCETIRIZINE DIHYDROCHLORIDE 5 MG PO TABS: 5.0000 mg | ORAL_TABLET | Freq: Every evening | ORAL | Status: DC

## 2014-12-27 MED ORDER — BENZONATATE 100 MG PO CAPS: 100.0000 mg | ORAL_CAPSULE | Freq: Three times a day (TID) | ORAL | Status: DC | PRN

## 2014-12-27 MED ORDER — LISINOPRIL-HYDROCHLOROTHIAZIDE 20-25 MG PO TABS: ORAL_TABLET | ORAL | Status: DC

## 2014-12-27 MED ORDER — ATORVASTATIN CALCIUM 20 MG PO TABS: 20.0000 mg | ORAL_TABLET | Freq: Every day | ORAL | Status: DC

## 2014-12-27 MED ORDER — AMOXICILLIN 875 MG PO TABS: 875.0000 mg | ORAL_TABLET | Freq: Two times a day (BID) | ORAL | Status: DC

## 2014-12-27 NOTE — Progress Notes (Signed)
Subjective:  This chart was scribed for Darlyne Russian, MD by Hannah Nguyen, ED Scribe. The patient was seen in room 22. Patient's care was started at 1:15 PM.    Patient ID: Hannah Nguyen, female    DOB: 08/05/1961, 54 y.o.   MRN: [id number]  Chief Complaint  Patient presents with  . Medication Refill    atorvastatin, lisinopril-hctz,and xyzal also, sinus headache with runny nose/cough   HPI HPI Comments: Hannah Nguyen is a 53 y.o. female who presents to the [facility] complaining of persistent dry cough for the past 4 days. Pt reports associated sinus pressure, congestion, clear rhinorrhea, subjective fever, HA, bilateral ear pain R worse than L. Triage temperature 98 F. Pt describes HA as a pulsating sensation. She has been treating with Flonase, cool humidifier, NyQuil and DayQuil equate with mild relief. Pt denies tobacco and alcohol use. Allergy to Vicodin.  Hypertension  Pt states that she checked her BP yesterday at [facility] with a reading of 171/93. She attributes elevated BP to NyQuil and DayQuil.    Seizures Pt states that her seizures have been doing well. 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    Immunizations  Pt received a flu vaccine this year.  Past Medical History  Diagnosis Date  . Hypertension   . Elevated cholesterol   . GERD (gastroesophageal reflux disease)   . Seizures     2 months ago was last episode of uncontrollable rt arm motions and passed out  . Nephrolithiasis   . Migraine   . Other and unspecified hyperlipidemia    Current Outpatient Prescriptions on File Prior to Visit  Medication Sig Dispense Refill  . atorvastatin (LIPITOR) 20 MG tablet Take 1 tablet (20 mg total) by mouth daily after supper. 30 tablet 6  . levETIRAcetam (KEPPRA) 500 MG tablet 250mg  in am, 500mg  in  pm. 45 tablet 6  . levocetirizine (XYZAL) 5 MG tablet Take 1 tablet (5  mg total) by mouth every evening. 30 tablet 11  . lisinopril-hydrochlorothiazide (PRINZIDE,ZESTORETIC) 20-25 MG per tablet Take 1 tablet by mouth daily. PATIENT NEEDS OFFICE VISIT FOR ADDITIONAL REFILLS 90 tablet 3  . Multiple Vitamins-Minerals (MULTIVITAMINS THER. W/MINERALS) TABS Take 1 tablet by mouth daily.       Current Facility-Administered Medications on File Prior to Visit  Medication Dose Route Frequency Provider Last Rate Last Dose  . triamcinolone acetonide (KENALOG) 10 MG/ML injection 10 mg  10 mg Other Once Harriet Masson, DPM       Allergies  Allergen Reactions  . Vicodin [Hydrocodone-Acetaminophen] Hives and Itching   Review of Systems  Constitutional: Positive for fever (subjective).  HENT: Positive for congestion, ear pain, rhinorrhea and sinus pressure.   Respiratory: Positive for cough.   Neurological: Positive for headaches.      Objective:   Physical Exam CONSTITUTIONAL: Well developed/well nourished HEAD: Normocephalic/atraumatic EYES: EOMI/PERRL ENMT: Mucous membranes moist. Ears normal. Tender over the R maxillary sinus. Nasal congestion. L broken upper incisor tooth.  NECK: supple no meningeal signs SPINE/BACK:entire spine nontender CV: S1/S2 noted, no murmurs/rubs/gallops noted LUNGS: Lungs are clear to auscultation bilaterally, no apparent distress ABDOMEN: soft, nontender, no rebound or guarding, bowel sounds noted throughout abdomen GU:no cva tenderness NEURO: Pt is awake/alert/appropriate, moves all extremitiesx4.  No facial droop.   EXTREMITIES: pulses normal/equal, full ROM SKIN: warm, color normal PSYCH: no abnormalities of mood noted, alert  and oriented to situation    Assessment & Plan:  Patient has signs or right maxillary sinusitis. Treat with amoxicillin. She was given Ladona Ridgel for cough. Her Xyzal was refilled and she will continue steroid nasal spray. Her blood pressure medicine and statin were refilled. Routine labs were done.I  personally performed the services described in this documentation, which was scribed in my presence. The recorded information has been reviewed and is accurate.

## 2014-12-27 NOTE — Patient Instructions (Signed)

## 2014-12-28 LAB — LIPID PANEL
Cholesterol: 235 mg/dL — ABNORMAL HIGH (ref 0–200)
HDL: 42 mg/dL (ref >39)
LDL CALC: 172 mg/dL — AB (ref 0–99)
Total CHOL/HDL Ratio: 5.6 Ratio
Triglycerides: 103 mg/dL (ref <150)
VLDL: 21 mg/dL (ref 0–40)

## 2014-12-28 LAB — COMPLETE METABOLIC PANEL WITH GFR
ALBUMIN: 4.3 g/dL (ref 3.5–5.2)
ALT: 16 U/L (ref 0–35)
AST: 21 U/L (ref 0–37)
Alkaline Phosphatase: 86 U/L (ref 39–117)
BUN: 11 mg/dL (ref 6–23)
CALCIUM: 9.7 mg/dL (ref 8.4–10.5)
CHLORIDE: 104 meq/L (ref 96–112)
CO2: 27 mEq/L (ref 19–32)
Creat: 0.74 mg/dL (ref 0.50–1.10)
GFR, Est Non African American: >89 mL/min
Glucose, Bld: 85 mg/dL (ref 70–99)
POTASSIUM: 4.7 meq/L (ref 3.5–5.3)
Sodium: 140 mEq/L (ref 135–145)
TOTAL PROTEIN: 7.1 g/dL (ref 6.0–8.3)
Total Bilirubin: 0.4 mg/dL (ref 0.2–1.2)

## 2014-12-29 ENCOUNTER — Encounter: Payer: Self-pay | Admitting: Family Medicine

## 2015-01-19 ENCOUNTER — Ambulatory Visit: Payer: Federal, State, Local not specified - PPO | Admitting: Internal Medicine

## 2015-01-24 ENCOUNTER — Ambulatory Visit (INDEPENDENT_AMBULATORY_CARE_PROVIDER_SITE_OTHER): Payer: BLUE CROSS/BLUE SHIELD | Admitting: Family Medicine

## 2015-01-24 ENCOUNTER — Ambulatory Visit (INDEPENDENT_AMBULATORY_CARE_PROVIDER_SITE_OTHER): Payer: BLUE CROSS/BLUE SHIELD

## 2015-01-24 VITALS — BP 142/84 | HR 76 | Temp 98.0°F | Resp 16 | Ht 65.0 in | Wt 175.4 lb

## 2015-01-24 DIAGNOSIS — S60111A Contusion of right thumb with damage to nail, initial encounter: Secondary | ICD-10-CM | POA: Diagnosis not present

## 2015-01-24 DIAGNOSIS — M79644 Pain in right finger(s): Secondary | ICD-10-CM

## 2015-01-24 DIAGNOSIS — S6991XA Unspecified injury of right wrist, hand and finger(s), initial encounter: Secondary | ICD-10-CM | POA: Diagnosis not present

## 2015-01-24 DIAGNOSIS — S62501A Fracture of unspecified phalanx of right thumb, initial encounter for closed fracture: Secondary | ICD-10-CM | POA: Diagnosis not present

## 2015-01-24 NOTE — Patient Instructions (Signed)
Take Aleve 2 pills twice daily for pain.  In addition to that can take acetaminophen (Tylenol) 1000 mg 3 times daily if needed  Ice to the thumb 2 or 3 times daily  Wear splint for protection of the thumb and stabilization of the fracture  Return in one week for a recheck. I will be in the office Tuesday morning the 15th. Come between 8:30 and 12 AM.  Light-duty starting Friday if able. If unable to we will extend your work excuse until next week when we see you back.

## 2015-01-24 NOTE — Progress Notes (Signed)
Subjective: 54 year old lady who closed her right thumb in the car door this morning. So she managed/the thumb from the PI he to the DIP and PIP thumb.  Objective: She is tender in the old film. The thumbnail has about a 50% bruise underneath it proximally.  Assessment: Thumb contusion Thumb pain Subungual hematoma  Plan: X-ray film  Plan to make a hole in the nail to left blood out, but I want to see if there are fractures first.  [facility] reading (PRIMARY) by  Dr. Linna Darner Probable fracture of proximal one third distal phalanx of right thumb  Spica splint and a contoured aluminum splint underneath it to support the thumb  Norco 5  Procedure: Drilled 2 small holes in the nail using an 18 needle to relieve the blood pressure from behind the nail from the subungual hematoma. Patient tolerated well.Marland Kitchen

## 2015-01-31 ENCOUNTER — Ambulatory Visit (INDEPENDENT_AMBULATORY_CARE_PROVIDER_SITE_OTHER): Payer: BLUE CROSS/BLUE SHIELD | Admitting: Family Medicine

## 2015-01-31 ENCOUNTER — Telehealth: Payer: Self-pay | Admitting: Family Medicine

## 2015-01-31 ENCOUNTER — Ambulatory Visit (INDEPENDENT_AMBULATORY_CARE_PROVIDER_SITE_OTHER): Payer: BLUE CROSS/BLUE SHIELD

## 2015-01-31 VITALS — BP 134/82 | HR 73 | Temp 98.0°F | Resp 16 | Ht 65.0 in | Wt 176.8 lb

## 2015-01-31 DIAGNOSIS — S6991XD Unspecified injury of right wrist, hand and finger(s), subsequent encounter: Secondary | ICD-10-CM | POA: Diagnosis not present

## 2015-01-31 DIAGNOSIS — S60111D Contusion of right thumb with damage to nail, subsequent encounter: Secondary | ICD-10-CM | POA: Diagnosis not present

## 2015-01-31 DIAGNOSIS — M79644 Pain in right finger(s): Secondary | ICD-10-CM | POA: Diagnosis not present

## 2015-01-31 NOTE — Telephone Encounter (Signed)
Left message to return call. Per Dr. Linna Darner received call report R thumb XR, she does have fx. Keep wearing the thumb spica splint and recheck in 3 weeks.

## 2015-01-31 NOTE — Patient Instructions (Signed)
Continue to wear the splint as needed  Return if further concern  This will probably take a couple more weeks to gradually get the pain relief.

## 2015-01-31 NOTE — Progress Notes (Addendum)
Subjective: Patient has been wearing the splint. The thumbnail is doing better, still draining a little bit of blood. The thumb has a little knot on the lateral aspect of the DIP joint where she had an old injury which involved a nerve. She hurts along the distal aspect of the middle phalangeal shaft of the thumb. The radiologist did not feel like he could call a fracture there.  Objective: Tenderness in the midportion of thumb is noted  Blood pressure is good today  Assessment: Thumb injury and pain, reassessment, still need to be certain there is not a small fracture Subungual hematoma, improving  Plan: Re-x-ray today.  [facility] reading (PRIMARY) by  Dr. Linna Darner Normal thumb  Use when necessary and return as needed  The radiologist note thinks that he see a small fracture. I asked my staff to contact her and let them know she is to keep wearing her bike her splint and have me recheck her thumb in about 3 weeks.  Marland Kitchen

## 2015-03-07 ENCOUNTER — Ambulatory Visit (INDEPENDENT_AMBULATORY_CARE_PROVIDER_SITE_OTHER): Payer: BLUE CROSS/BLUE SHIELD | Admitting: Physician Assistant

## 2015-03-07 ENCOUNTER — Encounter: Payer: Self-pay | Admitting: Physician Assistant

## 2015-03-07 VITALS — BP 138/84 | HR 69 | Temp 98.2°F | Resp 16 | Ht 65.75 in | Wt 172.4 lb

## 2015-03-07 DIAGNOSIS — Z1239 Encounter for other screening for malignant neoplasm of breast: Secondary | ICD-10-CM | POA: Diagnosis not present

## 2015-03-07 DIAGNOSIS — I10 Essential (primary) hypertension: Secondary | ICD-10-CM

## 2015-03-07 DIAGNOSIS — Z1211 Encounter for screening for malignant neoplasm of colon: Secondary | ICD-10-CM | POA: Diagnosis not present

## 2015-03-07 DIAGNOSIS — J302 Other seasonal allergic rhinitis: Secondary | ICD-10-CM | POA: Diagnosis not present

## 2015-03-07 DIAGNOSIS — Z6830 Body mass index (BMI) 30.0-30.9, adult: Secondary | ICD-10-CM | POA: Insufficient documentation

## 2015-03-07 DIAGNOSIS — M4806 Spinal stenosis, lumbar region: Secondary | ICD-10-CM | POA: Diagnosis not present

## 2015-03-07 DIAGNOSIS — Z6828 Body mass index (BMI) 28.0-28.9, adult: Secondary | ICD-10-CM | POA: Diagnosis not present

## 2015-03-07 DIAGNOSIS — E785 Hyperlipidemia, unspecified: Secondary | ICD-10-CM | POA: Diagnosis not present

## 2015-03-07 DIAGNOSIS — Z0000 Encounter for general adult medical examination without abnormal findings: Secondary | ICD-10-CM

## 2015-03-07 DIAGNOSIS — Z130 Encounter for screening for diseases of the blood and blood-forming organs and certain disorders involving the immune mechanism: Secondary | ICD-10-CM | POA: Diagnosis not present

## 2015-03-07 DIAGNOSIS — Z114 Encounter for screening for human immunodeficiency virus [HIV]: Secondary | ICD-10-CM

## 2015-03-07 DIAGNOSIS — M48061 Spinal stenosis, lumbar region without neurogenic claudication: Secondary | ICD-10-CM

## 2015-03-07 LAB — POCT URINALYSIS DIPSTICK
Bilirubin, UA: NEGATIVE
Blood, UA: NEGATIVE
Glucose, UA: NEGATIVE
KETONES UA: NEGATIVE
Nitrite, UA: NEGATIVE
PH UA: 7.5
Protein, UA: 30
SPEC GRAV UA: 1.015
Urobilinogen, UA: 0.2

## 2015-03-07 LAB — POCT UA - MICROSCOPIC ONLY
AMORPHOUS: POSITIVE
Casts, Ur, LPF, POC: NEGATIVE
Crystals, Ur, HPF, POC: NEGATIVE
Mucus, UA: POSITIVE
Yeast, UA: NEGATIVE

## 2015-03-07 LAB — CBC WITH DIFFERENTIAL/PLATELET
BASOS ABS: 0 10*3/uL (ref 0.0–0.1)
Basophils Relative: 0 % (ref 0–1)
Eosinophils Absolute: 0.1 10*3/uL (ref 0.0–0.7)
Eosinophils Relative: 2 % (ref 0–5)
HCT: 39.5 % (ref 36.0–46.0)
Hemoglobin: 13.4 g/dL (ref 12.0–15.0)
LYMPHS PCT: 42 % (ref 12–46)
Lymphs Abs: 2.5 10*3/uL (ref 0.7–4.0)
MCH: 31.1 pg (ref 26.0–34.0)
MCHC: 33.9 g/dL (ref 30.0–36.0)
MCV: 91.6 fL (ref 78.0–100.0)
MPV: 10.5 fL (ref 8.6–12.4)
Monocytes Absolute: 0.4 10*3/uL (ref 0.1–1.0)
Monocytes Relative: 7 % (ref 3–12)
NEUTROS ABS: 2.9 10*3/uL (ref 1.7–7.7)
Neutrophils Relative %: 49 % (ref 43–77)
PLATELETS: 293 10*3/uL (ref 150–400)
RBC: 4.31 MIL/uL (ref 3.87–5.11)
RDW: 13.6 % (ref 11.5–15.5)
WBC: 5.9 10*3/uL (ref 4.0–10.5)

## 2015-03-07 LAB — LIPID PANEL
Cholesterol: 147 mg/dL (ref 0–200)
HDL: 48 mg/dL (ref >=46)
LDL Cholesterol: 80 mg/dL (ref 0–99)
Total CHOL/HDL Ratio: 3.1 Ratio
Triglycerides: 96 mg/dL (ref <150)
VLDL: 19 mg/dL (ref 0–40)

## 2015-03-07 LAB — COMPREHENSIVE METABOLIC PANEL
ALT: 31 U/L (ref 0–35)
AST: 26 U/L (ref 0–37)
Albumin: 4.1 g/dL (ref 3.5–5.2)
Alkaline Phosphatase: 84 U/L (ref 39–117)
BILIRUBIN TOTAL: 0.6 mg/dL (ref 0.2–1.2)
BUN: 14 mg/dL (ref 6–23)
CO2: 33 meq/L — AB (ref 19–32)
CREATININE: 0.74 mg/dL (ref 0.50–1.10)
Calcium: 9.6 mg/dL (ref 8.4–10.5)
Chloride: 103 mEq/L (ref 96–112)
GLUCOSE: 85 mg/dL (ref 70–99)
Potassium: 4.3 mEq/L (ref 3.5–5.3)
Sodium: 140 mEq/L (ref 135–145)
Total Protein: 6.8 g/dL (ref 6.0–8.3)

## 2015-03-07 LAB — HIV ANTIBODY (ROUTINE TESTING W REFLEX): HIV: NONREACTIVE

## 2015-03-07 LAB — TSH: TSH: 0.897 u[IU]/mL (ref 0.350–4.500)

## 2015-03-07 MED ORDER — NABUMETONE 750 MG PO TABS: 750.0000 mg | ORAL_TABLET | Freq: Two times a day (BID) | ORAL | Status: DC | PRN

## 2015-03-07 NOTE — Progress Notes (Signed)
Subjective:    Patient ID: Hannah Nguyen, female    DOB: 01/06/1961, 54 y.o.   MRN: [id number]  HPI  Hannah Nguyen is a pleasant 54 year old woman who presents today for annual physical exam.  She is doing well and reports no major changes in her health. She would like to lose weight and is trying to eat healthier. She recently started a meatless diet and is inquiring about the amount of protein she should be eating on a daily basis. Would like to see a nutritionist for help with healthy eating and appropriate meal choices. She is still not exercising aside from walking that she does for her job during the day.   She sees orthopedics for stenosis of L4/L5 disk with radiating symptoms to her left leg. When she has pain it is an aching, dull pain that is relieved with pressure and walking. Orthopedics offered her a steroid shot in her back, but she declined. Denies any numbness, tingling, or shooting pain at this time. She does not take anything regularly for pain. Tried Aleve without relief, and does not like Meloxicam.  Last seizure was about 1 year ago. She follows with neurology who prescribes her Keppra. She is scheduled to follow up with them in May 2016. Denies any headaches, dizziness, or lightheadedness.   She has seasonal allergies which are well-controlled with Flonase nasal spray and Levocetirizine oral medication. Denies any cough, post nasal drip, rhinorrhea, or sore throat.   Review of Systems  Constitutional: Positive for fatigue. Negative for fever, chills, diaphoresis and unexpected weight change.  HENT: Negative for congestion, ear pain, postnasal drip, rhinorrhea, sinus pressure and sore throat.   Eyes: Negative for pain and itching.  Respiratory: Negative for cough, chest tightness and shortness of breath.   Cardiovascular: Negative for chest pain and palpitations.  Gastrointestinal: Negative for nausea, vomiting, abdominal pain, diarrhea, constipation and blood in  stool.  Genitourinary: Negative for dysuria, frequency, hematuria, vaginal bleeding, vaginal discharge and vaginal pain.  Musculoskeletal: Positive for back pain. Negative for myalgias and arthralgias.  Skin: Negative for rash.  Allergic/Immunologic: Positive for environmental allergies.  Neurological: Negative for dizziness, seizures, light-headedness and headaches.       Objective:   Physical Exam  Constitutional: She is oriented to person, place, and time. She appears well-developed and well-nourished. No distress.  BP 138/84 mmHg  Pulse 69  Temp(Src) 98.2 F (36.8 C) (Oral)  Resp 16  Ht 5' 5.75" (1.67 m)  Wt 172 lb 6.4 oz (78.2 kg)  BMI 28.04 kg/m2  SpO2 98%  LMP 09/19/2011  HENT:  Head: Normocephalic and atraumatic.  Right Ear: Tympanic membrane, external ear and ear canal normal.  Left Ear: Tympanic membrane, external ear and ear canal normal.  Nose: Nose normal. No mucosal edema or rhinorrhea.  Mouth/Throat: Uvula is midline and oropharynx is clear and moist. No oropharyngeal exudate, posterior oropharyngeal edema or posterior oropharyngeal erythema.  Eyes: Conjunctivae are normal. Pupils are equal, round, and reactive to light.  Neck: Neck supple. No spinous process tenderness and no muscular tenderness present.  Cardiovascular: Normal rate, regular rhythm, normal heart sounds and intact distal pulses.  Exam reveals no gallop and no friction rub.   No murmur heard. Pulmonary/Chest: Effort normal and breath sounds normal. She has no wheezes. She has no rales. Right breast exhibits no inverted nipple, no mass, no nipple discharge, no skin change and no tenderness. Left breast exhibits no inverted nipple, no mass, no nipple discharge, no  skin change and no tenderness.  Abdominal: Soft. Bowel sounds are normal. She exhibits no distension and no mass. There is no tenderness.  Musculoskeletal: Normal range of motion. She exhibits no edema.  Upper and lower extremity strength 5/5.  Good grip strength.  Lymphadenopathy:       Head (right side): No submental, no submandibular, no tonsillar, no preauricular, no posterior auricular and no occipital adenopathy present.       Head (left side): No submental, no submandibular, no tonsillar, no preauricular, no posterior auricular and no occipital adenopathy present.    She has no cervical adenopathy.       Right cervical: No superficial cervical, no deep cervical and no posterior cervical adenopathy present.      Left cervical: No superficial cervical, no deep cervical and no posterior cervical adenopathy present.    She has no axillary adenopathy.  Neurological: She is alert and oriented to person, place, and time.  Skin: Skin is warm and dry. No rash noted. No erythema.  Psychiatric: She has a normal mood and affect. Her behavior is normal.      Assessment & Plan:  1. Annual physical exam Appropriate anticipatory guidance provided.  2. Essential hypertension Stable. - CBC with Differential/Platelet - Comprehensive metabolic panel - TSH - POCT UA - Microscopic Only - POCT urinalysis dipstick  3. Hyperlipidemia - Lipid panel  4. Spinal stenosis of lumbar region She is seen by orthopedics. Denies steroid injection at this time. - nabumetone (RELAFEN) 750 MG tablet; Take 1 tablet (750 mg total) by mouth 2 (two) times daily as needed.  Dispense: 60 tablet; Refill: 3  5. Other seasonal allergic rhinitis Stable. Takes Xyzal daily for symptomatic control.  6. Screening for deficiency anemia - CBC with Differential/Platelet  7. Screening for breast cancer Breast exam.  8. Screening for colon cancer Colonoscopy completed in 2014 with normal results. She does not need another colonoscopy until 2024. Advised that she would start doing the stool guaiac cards five years after the colonoscopy, which will be in 2019.  9. Screening for HIV (human immunodeficiency virus) - HIV antibody  10. BMI 28.0-28.9,adult -  Ambulatory referral to diabetic education

## 2015-03-07 NOTE — Progress Notes (Signed)
Subjective:    Patient ID: Hannah Nguyen, female    DOB: Sep 16, 1961, 54 y.o.   MRN: [id number]  PCP: Xandria Gallaga, PA-C  Chief Complaint  Patient presents with  . Annual Exam  . Leg Pain    dx with stenosis    HPI  Presents today for Annual Exam.  She is doing well and reports no major changes in her health. She would like to lose weight and is trying to eat healthier. She recently started a meatless diet and is inquiring about the amount of protein she should be eating on a daily basis. Would like to see a nutritionist for help with healthy eating and appropriate meal choices. She is still not exercising aside from walking that she does for her job during the day.   She sees orthopedics for lumbar spinal stenosis of L4/L5 with radiating symptoms to her left leg. When she has pain it is an aching, dull pain that is relieved with pressure and walking. Orthopedics offered her a steroid shot in her back, but she declined. Denies any numbness, tingling, or shooting pain at this time. She does not take anything regularly for pain. Tried Aleve without relief, and does not like Meloxicam. She is allergic to hydrocodone.  Last seizure was about 1 year ago. She follows with neurology who prescribes her Keppra. She is scheduled to follow up with them in May 2016. Denies any headaches, dizziness, or lightheadedness.   She has seasonal allergies which are well-controlled with Flonase nasal spray and Levocetirizine oral medication. Denies any cough, post nasal drip, rhinorrhea, or sore throat.    Review of Systems  Constitutional: Positive for fatigue. Negative for fever, chills, diaphoresis, activity change, appetite change and unexpected weight change.  HENT: Positive for dental problem (lost LEFT front tooth). Negative for congestion, ear pain, postnasal drip, rhinorrhea, sinus pressure, sneezing, sore throat and trouble swallowing.   Eyes: Negative.   Respiratory: Negative.     Cardiovascular: Negative.   Gastrointestinal: Negative.   Endocrine: Negative.   Genitourinary: Negative.   Musculoskeletal: Positive for myalgias, back pain and arthralgias. Negative for joint swelling, gait problem, neck pain and neck stiffness.  Skin: Negative.   Allergic/Immunologic: Positive for environmental allergies. Negative for food allergies and immunocompromised state.  Neurological: Negative.   Hematological: Negative.   Psychiatric/Behavioral: Negative.    Allergies  Allergen Reactions  . Vicodin [Hydrocodone-Acetaminophen] Hives and Itching    Prior to Admission medications   Medication Sig Start Date End Date Taking? Authorizing Provider  atorvastatin (LIPITOR) 20 MG tablet Take 1 tablet (20 mg total) by mouth daily after supper. 12/27/14  Yes Darlyne Russian, MD  levETIRAcetam (KEPPRA) 500 MG tablet 250mg  in am, 500mg  in  pm. 09/20/14  Yes Megan P Millikan, NP  levocetirizine (XYZAL) 5 MG tablet Take 1 tablet (5 mg total) by mouth every evening. 12/27/14 12/27/15 Yes Darlyne Russian, MD  lisinopril-hydrochlorothiazide (PRINZIDE,ZESTORETIC) 20-25 MG per tablet Take 1 tablet daily 12/27/14  Yes Darlyne Russian, MD  Multiple Vitamins-Minerals (MULTIVITAMINS THER. W/MINERALS) TABS Take 1 tablet by mouth daily.     Yes Historical Provider, MD  benzonatate (TESSALON) 100 MG capsule Take 1-2 capsules (100-200 mg total) by mouth 3 (three) times daily as needed for cough. Patient not taking: Reported on 03/07/2015 12/27/14   Darlyne Russian, MD  nabumetone (RELAFEN) 750 MG tablet Take 1 tablet (750 mg total) by mouth 2 (two) times daily as needed. 03/07/15   Fara Chute, PA-C  Patient Active Problem List   Diagnosis Date Noted  . Spinal stenosis of lumbar region 03/07/2015  . BMI 28.0-28.9,adult 03/07/2015  . HTN (hypertension) 01/31/2014  . Other and unspecified hyperlipidemia 01/31/2014  . Migraine   . Partial seizure 08/17/2012   Family History  Problem Relation Age of Onset  .  Diabetes Mother   . Hyperlipidemia Mother   . Hypertension Mother   . Breast cancer Mother 97    s/p bilateral mastectomy  . Diabetes Father   . Hyperlipidemia Father   . Hypertension Father   . Hypertension Sister   . Bipolar disorder Daughter   . Asthma Daughter   . Diabetes Daughter   . Bipolar disorder Son     History   Social History  . Marital Status: Married    Spouse Name: Owens Shark   . Number of Children: 2  . Years of Education: 13   Occupational History  . [employer]   Social History Main Topics  . Smoking status: Never Smoker   . Smokeless tobacco: Never Used  . Alcohol Use: No  . Drug Use: No  . Sexual Activity: Not on file   Other Topics Concern  . Not on file   Social History Narrative   Patient lives at home with her husband    Patient works full time.   Right handed.    Caffeine- three sodas daily.       Objective:   Physical Exam  Constitutional: She is oriented to person, place, and time. Vital signs are normal. She appears well-developed and well-nourished. She is active and cooperative. No distress.  BP 138/84 mmHg  Pulse 69  Temp(Src) 98.2 F (36.8 C) (Oral)  Resp 16  Ht 5' 5.75" (1.67 m)  Wt 172 lb 6.4 oz (78.2 kg)  BMI 28.04 kg/m2  SpO2 98%  LMP 09/19/2011   HENT:  Head: Normocephalic and atraumatic.  Right Ear: Hearing, tympanic membrane, external ear and ear canal normal. No foreign bodies.  Left Ear: Hearing, tympanic membrane, external ear and ear canal normal. No foreign bodies.  Nose: Nose normal.  Mouth/Throat: Uvula is midline, oropharynx is clear and moist and mucous membranes are normal. No oral lesions. Normal dentition. No dental abscesses or uvula swelling. No oropharyngeal exudate.  Eyes: Conjunctivae, EOM and lids are normal. Pupils are equal, round, and reactive to light. Right eye exhibits no discharge. Left eye exhibits no discharge. No scleral icterus.  Fundoscopic exam:      The right eye shows no  arteriolar narrowing, no AV nicking, no exudate, no hemorrhage and no papilledema. The right eye shows red reflex.       The left eye shows no arteriolar narrowing, no AV nicking, no exudate, no hemorrhage and no papilledema. The left eye shows red reflex.  Neck: Trachea normal, normal range of motion and full passive range of motion without pain. Neck supple. No spinous process tenderness and no muscular tenderness present. No thyroid mass and no thyromegaly present.  Cardiovascular: Normal rate, regular rhythm, normal heart sounds, intact distal pulses and normal pulses.   Pulmonary/Chest: Effort normal and breath sounds normal.  Abdominal: Soft. Bowel sounds are normal. She exhibits no distension and no mass. There is no tenderness.  Genitourinary:  Not a candidate for cervical cancer screening as she is s/p hysterectomy. No vaginal complaints/concerns today.  Musculoskeletal: She exhibits no edema or tenderness.       Cervical back: Normal.       Thoracic back:  Normal.       Lumbar back: Normal.  Lymphadenopathy:       Head (right side): No tonsillar, no preauricular, no posterior auricular and no occipital adenopathy present.       Head (left side): No tonsillar, no preauricular, no posterior auricular and no occipital adenopathy present.    She has no cervical adenopathy.       Right: No supraclavicular adenopathy present.       Left: No supraclavicular adenopathy present.  Neurological: She is alert and oriented to person, place, and time. She has normal strength and normal reflexes. No cranial nerve deficit. She exhibits normal muscle tone. Coordination and gait normal.  Skin: Skin is warm, dry and intact. No rash noted. She is not diaphoretic. No cyanosis or erythema. Nails show no clubbing.  Psychiatric: She has a normal mood and affect. Her speech is normal and behavior is normal. Judgment and thought content normal.          Assessment & Plan:  1. Annual physical exam Age  appropriate anticipatory guidance provided.  2. Essential hypertension Controlled. Continue lisinoprilHCT. - CBC with Differential/Platelet - Comprehensive metabolic panel - TSH - POCT UA - Microscopic Only - POCT urinalysis dipstick  3. Hyperlipidemia Await lab results. Continue Lipitor. Adjust dose if indicated. - Lipid panel  4. Spinal stenosis of lumbar region Trial of Relafen. If she has adverse effects, consider trial of Celebrex. Follow-up with orthopedics if symptoms worsen or if she decides to have the steroid injections. - nabumetone (RELAFEN) 750 MG tablet; Take 1 tablet (750 mg total) by mouth 2 (two) times daily as needed.  Dispense: 60 tablet; Refill: 3  5. Other seasonal allergic rhinitis Controlled on Xyzal. Continue.  6. Screening for deficiency anemia Await CBC results.  7. Screening for breast cancer Needs mammography Q1-2 years. Last mammogram was 03/16/2014. She plans to repeat this summer.  8. Screening for colon cancer Colonoscopy 2014, normal. Begin iFOBT in 2019, repeat colonoscopy in 2024 unless iFOBT positive.  9. Screening for HIV (human immunodeficiency virus) - HIV antibody  10. BMI 28.0-28.9,adult She'd like some education on healthy vegetarian eating. Referral for nutrition education. Increase exercise as she's able, given spinal stenosis symptoms. - Ambulatory referral to diabetic education   Fara Chute, PA-C Physician Assistant-Certified [facility]

## 2015-03-07 NOTE — Patient Instructions (Signed)
I will contact you with your lab results as soon as they are available.   If you have not heard from me in 2 weeks, please contact me.  The fastest way to get your results is to register for My Chart (see the instructions on the last page of this printout).  Keeping You Healthy  Get These Tests  Blood Pressure- Have your blood pressure checked by your healthcare provider at least once a year.  Normal blood pressure is 120/80.  Weight- Have your body mass index (BMI) calculated to screen for obesity.  BMI is a measure of body fat based on height and weight.  You can calculate your own BMI at [employer].it  Cholesterol- Have your cholesterol checked every year.  Diabetes- Have your blood sugar checked every year if you have high blood pressure, high cholesterol, a family history of diabetes or if you are overweight.  Pap Smear- Have a pap smear every 1 to 5 years if you have been sexually active.  If you are older than 65 and recent pap smears have been normal you may not need additional pap smears.  In addition, if you have had a hysterectomy  For benign disease additional pap smears are not necessary.  Mammogram-Yearly mammograms are essential for early detection of breast cancer  Screening for Colon Cancer- Colonoscopy starting at age 58. Screening may begin sooner depending on your family history and other health conditions.  Follow up colonoscopy as directed by your Gastroenterologist.  Screening for Osteoporosis- Screening begins at age 20 with bone density scanning, sooner if you are at higher risk for developing Osteoporosis.  Get these medicines  Calcium with Vitamin D- Your body requires 1200-1500 mg of Calcium a day and [phone] IU of Vitamin D a day.  You can only absorb 500 mg of Calcium at a time therefore Calcium must be taken in 2 or 3 separate doses throughout the day.  Hormones- Hormone therapy has been associated with increased risk for certain cancers and  heart disease.  Talk to your healthcare provider about if you need relief from menopausal symptoms.  Aspirin- Ask your healthcare provider about taking Aspirin to prevent Heart Disease and Stroke.  Get these Immuniztions  Flu shot- Every fall  Pneumonia shot- Once after the age of 102; if you are younger ask your healthcare provider if you need a pneumonia shot.  Tetanus- Every ten years.  Zostavax- Once after the age of 57 to prevent shingles.  Take these steps  Don't smoke- Your healthcare provider can help you quit. For tips on how to quit, ask your healthcare provider or go to www.smokefree.gov or call 1-800 QUIT-NOW.  Be physically active- Exercise 5 days a week for a minimum of 30 minutes.  If you are not already physically active, start slow and gradually work up to 30 minutes of moderate physical activity.  Try walking, dancing, bike riding, swimming, etc.  Eat a healthy diet- Eat a variety of healthy foods such as fruits, vegetables, whole grains, low fat milk, low fat cheeses, yogurt, lean meats, chicken, fish, eggs, dried beans, tofu, etc.  For more information go to www.thenutritionsource.org  Dental visit- Brush and floss teeth twice daily; visit your dentist twice a year.  Eye exam- Visit your Optometrist or Ophthalmologist yearly.  Drink alcohol in moderation- Limit alcohol intake to one drink or less a day.  Never drink and drive.  Depression- Your emotional health is as important as your physical health.  If you're feeling  down or losing interest in things you normally enjoy, please talk to your healthcare provider.  Seat Belts- can save your life; always wear one  Smoke/Carbon Monoxide detectors- These detectors need to be installed on the appropriate level of your home.  Replace batteries at least once a year.  Violence- If anyone is threatening or hurting you, please tell your healthcare provider.  Living Will/ Health care power of attorney- Discuss with your  healthcare provider and family. 

## 2015-03-21 ENCOUNTER — Ambulatory Visit: Payer: Federal, State, Local not specified - PPO | Admitting: Nurse Practitioner

## 2015-04-25 ENCOUNTER — Encounter: Payer: Self-pay | Admitting: Nurse Practitioner

## 2015-04-25 ENCOUNTER — Ambulatory Visit (INDEPENDENT_AMBULATORY_CARE_PROVIDER_SITE_OTHER): Payer: Federal, State, Local not specified - PPO | Admitting: Nurse Practitioner

## 2015-04-25 VITALS — BP 131/83 | HR 69 | Ht 66.0 in | Wt 175.2 lb

## 2015-04-25 DIAGNOSIS — G43909 Migraine, unspecified, not intractable, without status migrainosus: Secondary | ICD-10-CM

## 2015-04-25 DIAGNOSIS — R569 Unspecified convulsions: Secondary | ICD-10-CM | POA: Diagnosis not present

## 2015-04-25 MED ORDER — LEVETIRACETAM 500 MG PO TABS: ORAL_TABLET | ORAL | Status: DC

## 2015-04-25 NOTE — Patient Instructions (Signed)
Continue Keppra at current dose will refill for one year Call for any seizure activity Continue to be active as much as possible for overall health and well-being Follow-up in 6 months

## 2015-04-25 NOTE — Progress Notes (Signed)
[facility]  PATIENT: Hannah Nguyen DOB: 04-Apr-1961   REASON FOR VISIT: Follow-up for seizure disorder and migraines  HISTORY FROM: Patient    HISTORY OF PRESENT ILLNESS:Ms. Hannah Nguyen is a 54 year old female with a history of migraines. She returns today for follow-up. She is currently taking Keppra 750mg  daily- 500 mg in the PM and 250 mg in the AM. She reports that her last seizure was almost three years ago. She operates a Teacher, music without difficulty. At home she  can complete all ADLs independently. No new neurological complaints. She reports that she  has stenosis in the lumbar region at L4-L5. She is not having significant issues with this now but if it worsens then her orthopedic doctor will start epidural steroid injections. She continues to work two part-time jobs. She tries to be active. She needs refills on her Keppra.   History 03/16/14 (CM): 54 year old female returns for followup. She has a history of seizure disorder. She was last seen by Dr. Krista Blue 10/07/2013 ,she was placed on extended release Keppra and the patient claims she had side effects to the medication. She called into the office and did not get a response therefore she went back to taking her 500 mg of Keppra at night only. No seizure events in a couple of years. Appetite is reportedly good, she does have some difficulty sleeping at times due to muscle pain in her left thigh. She is due to see a orthopedist. She had a recent CBC and CMP by primary care which was reviewed. She was made aware she's taking suboptimal dose of Keppra. She returns for reevaluation. HISTORY (YY): She has past medical history of hypertension, hyperlipidemia, migraine headaches, presenting with passing out episode in January 29, 2010.  she was with her family at home, after watching TV for a while in January 29 2010, she decided to fix something for her child, she remember walking to the kitchen, the next thing, she woke  up on the floor, EMS were there, all rounding her, she apparently had a passout, there was no evidence she had hurt herself, no evidence of trauma, urinary incontinence, there was no tongue biting. Family started trying to arouse her by rubbing her hands, shaking her, 3-5 minutes before she was able to be aroused completely, but that time EMS has arrived.  She was admitted to hospital on January 29 2010, during her first episode, she was started on Keppra 500 mg twice a day, but she is not taking it regularly. Echocardiogram showed ejection fraction 60-65%, EKG and telemetry monitoring was normal, CT scan of the head was normal.  Since the initial episode, she had episode of uncontrollable right arm shaking, lasting few minutes, without loss of consciousness, she often feel hot, with elevated blood pressure during the episode, this can happen 3 times or evenl more times in a week.  Since Keppra was started, she has much less recurrent episode, and denies any since last seen 08/07/11. Her episode in the past described as stereotypical intermittent right arm uncontrollable shaking, lasting 2-3 minutes, not under her voluntary controlled, no associated right face or right lower extremity shaking. Keppra make her drowsy. EEG showed intermittent right central slowing, occasionally right P4 sharp transient. MRI of the brain has demonstrated supratentorial small vessel disease,atrophy more than expected for her age.    REVIEW OF SYSTEMS: Full 14 system review of systems performed and notable only for those listed, all others are neg:  Constitutional: neg  Cardiovascular: neg Ear/Nose/Throat: neg  Skin: neg Eyes: neg Respiratory: neg Gastroitestinal: neg  Hematology/Lymphatic: neg  Endocrine: neg Musculoskeletal: Spinal stenosis Allergy/Immunology: neg Neurological: neg Psychiatric: neg Sleep : neg   ALLERGIES: Allergies  Allergen Reactions  . Vicodin [Hydrocodone-Acetaminophen] Hives and Itching      HOME MEDICATIONS: Outpatient Prescriptions Prior to Visit  Medication Sig Dispense Refill  . atorvastatin (LIPITOR) 20 MG tablet Take 1 tablet (20 mg total) by mouth daily after supper. 30 tablet 11  . levETIRAcetam (KEPPRA) 500 MG tablet 250mg  in am, 500mg  in  pm. 45 tablet 6  . levocetirizine (XYZAL) 5 MG tablet Take 1 tablet (5 mg total) by mouth every evening. 30 tablet 11  . lisinopril-hydrochlorothiazide (PRINZIDE,ZESTORETIC) 20-25 MG per tablet Take 1 tablet daily 90 tablet 3  . Multiple Vitamins-Minerals (MULTIVITAMINS THER. W/MINERALS) TABS Take 1 tablet by mouth daily.      . benzonatate (TESSALON) 100 MG capsule Take 1-2 capsules (100-200 mg total) by mouth 3 (three) times daily as needed for cough. (Patient not taking: Reported on 03/07/2015) 40 capsule 0  . nabumetone (RELAFEN) 750 MG tablet Take 1 tablet (750 mg total) by mouth 2 (two) times daily as needed. (Patient not taking: Reported on 04/25/2015) 60 tablet 3   No facility-administered medications prior to visit.    PAST MEDICAL HISTORY: Past Medical History  Diagnosis Date  . Hypertension   . Elevated cholesterol   . GERD (gastroesophageal reflux disease)   . Seizures     2 months ago was last episode of uncontrollable rt arm motions and passed out  . Nephrolithiasis   . Migraine   . Other and unspecified hyperlipidemia   . Leiomyoma of uterus 10/22/2011  . Bartholin's gland cyst 10/22/2011  . Spinal stenosis 11/2014    L4-L5, L leg pain    PAST SURGICAL HISTORY: Past Surgical History  Procedure Laterality Date  . Lithotripsy    . Cesarean section      x2  . Knee arthroscopy      x2  . Thumb surg    . Robotic assisted lap vaginal hysterectomy  10/25/2011    Procedure: ROBOTIC ASSISTED LAPAROSCOPIC VAGINAL HYSTERECTOMY;  Surgeon: Agnes Lawrence, MD;  Location: [facility];  Service: Gynecology;  Laterality: N/A;  Marsupralization og bartholin gland, cyst biopsy  . Abdominal hysterectomy      FAMILY  HISTORY: Family History  Problem Relation Age of Onset  . Diabetes Mother   . Hyperlipidemia Mother   . Hypertension Mother   . Breast cancer Mother 45    s/p bilateral mastectomy  . Diabetes Father   . Hyperlipidemia Father   . Hypertension Father   . Hypertension Sister   . Bipolar disorder Daughter   . Asthma Daughter   . Diabetes Daughter   . Bipolar disorder Son     SOCIAL HISTORY: History   Social History  . Marital Status: Married    Spouse Name: Owens Shark   . Number of Children: 2  . Years of Education: 13   Occupational History  . [employer]   Social History Main Topics  . Smoking status: Never Smoker   . Smokeless tobacco: Never Used  . Alcohol Use: No  . Drug Use: No  . Sexual Activity: Not on file   Other Topics Concern  . Not on file   Social History Narrative   Patient lives at home with her husband    Patient works full time.   Right handed.  Caffeine- three sodas daily.     PHYSICAL EXAM  Filed Vitals:   04/25/15 0755  BP: 131/83  Pulse: 69  Height: 5\' 6"  (1.676 m)  Weight: 175 lb 3.2 oz (79.47 kg)   Body mass index is 28.29 kg/(m^2).  Generalized: Well developed, in no acute distress  Head: normocephalic and atraumatic,. Oropharynx benign   Musculoskeletal: No deformity   Neurological examination  Mentation: Alert oriented to time, place, history taking. Attention span and concentration appropriate. Recent and remote memory intact.  Follows all commands speech and language fluent.  Cranial nerve II-XII: Pupils were equal round reactive to light extraocular movements were full, visual field were full on confrontational test. Facial sensation and strength were normal. hearing was intact to finger rubbing bilaterally. Uvula tongue midline. head turning and shoulder shrug were normal and symmetric.Tongue protrusion into cheek strength was normal. Motor: normal bulk and tone, full strength in the BUE, BLE, fine finger  movements normal, no pronator drift. No focal weakness Coordination: finger-nose-finger, heel-to-shin bilaterally, no dysmetria Reflexes: Symmetric upper and lower, plantar responses were flexor bilaterally. Gait and Station: Rising up from seated position without assistance, normal stance,  moderate stride, good arm swing, smooth turning, able to perform tiptoe, and heel walking without difficulty. Tandem gait is steady. No assistive device  DIAGNOSTIC DATA (LABS, IMAGING, TESTING) - I reviewed patient records, labs, notes, testing and imaging myself where available.  Lab Results  Component Value Date   WBC 5.9 03/07/2015   HGB 13.4 03/07/2015   HCT 39.5 03/07/2015   MCV 91.6 03/07/2015   PLT 293 03/07/2015      Component Value Date/Time   NA 140 03/07/2015 1022   K 4.3 03/07/2015 1022   CL 103 03/07/2015 1022   CO2 33* 03/07/2015 1022   GLUCOSE 85 03/07/2015 1022   BUN 14 03/07/2015 1022   CREATININE 0.74 03/07/2015 1022   CREATININE 0.66 10/26/2011 0349   CALCIUM 9.6 03/07/2015 1022   PROT 6.8 03/07/2015 1022   ALBUMIN 4.1 03/07/2015 1022   AST 26 03/07/2015 1022   ALT 31 03/07/2015 1022   ALKPHOS 84 03/07/2015 1022   BILITOT 0.6 03/07/2015 1022   GFRNONAA >89 12/27/2014 1342   GFRNONAA >90 10/26/2011 0349   GFRAA >89 12/27/2014 1342   GFRAA >90 10/26/2011 0349   Lab Results  Component Value Date   CHOL 147 03/07/2015   HDL 48 03/07/2015   LDLCALC 80 03/07/2015   TRIG 96 03/07/2015   CHOLHDL 3.1 03/07/2015    Lab Results  Component Value Date   TSH 0.897 03/07/2015      ASSESSMENT AND PLAN  54 y.o. year old female  has a past medical history of Hypertension; Elevated cholesterol;  Seizures; Nephrolithiasis; Migraine;  and Spinal stenosis (11/2014). here to follow-up for her seizure disorder and her migraines which are in good control. Last seizure 3 years ago.  Continue Keppra at current dose will refill for 6 months Call for any seizure activity Continue  to be active as much as possible for overall health and well-being Follow-up in 6 months Dennie Bible, [facility], [facility], [facility] [address], [place], [place] [address] [phone]

## 2015-04-28 NOTE — Progress Notes (Signed)
I have reviewed and agreed above plan. 

## 2015-05-19 DIAGNOSIS — N159 Renal tubulo-interstitial disease, unspecified: Secondary | ICD-10-CM

## 2015-05-19 HISTORY — DX: Renal tubulo-interstitial disease, unspecified: N15.9

## 2015-06-22 ENCOUNTER — Other Ambulatory Visit: Payer: Self-pay

## 2015-06-22 DIAGNOSIS — Z1231 Encounter for screening mammogram for malignant neoplasm of breast: Secondary | ICD-10-CM

## 2015-06-27 ENCOUNTER — Ambulatory Visit: Payer: Federal, State, Local not specified - PPO

## 2015-07-02 ENCOUNTER — Encounter (HOSPITAL_COMMUNITY): Payer: Self-pay | Admitting: *Deleted

## 2015-07-02 ENCOUNTER — Emergency Department (HOSPITAL_COMMUNITY)
Admission: EM | Admit: 2015-07-02 | Discharge: 2015-07-02 | Disposition: A | Discharge status: Home / Self Care | Payer: Federal, State, Local not specified - PPO | Attending: Emergency Medicine | Admitting: Emergency Medicine

## 2015-07-02 ENCOUNTER — Emergency Department (HOSPITAL_COMMUNITY): Payer: Federal, State, Local not specified - PPO

## 2015-07-02 DIAGNOSIS — H538 Other visual disturbances: Secondary | ICD-10-CM | POA: Insufficient documentation

## 2015-07-02 DIAGNOSIS — G40909 Epilepsy, unspecified, not intractable, without status epilepticus: Secondary | ICD-10-CM | POA: Diagnosis not present

## 2015-07-02 DIAGNOSIS — N390 Urinary tract infection, site not specified: Secondary | ICD-10-CM | POA: Diagnosis not present

## 2015-07-02 DIAGNOSIS — R7989 Other specified abnormal findings of blood chemistry: Secondary | ICD-10-CM

## 2015-07-02 DIAGNOSIS — R109 Unspecified abdominal pain: Secondary | ICD-10-CM

## 2015-07-02 DIAGNOSIS — Z87448 Personal history of other diseases of urinary system: Secondary | ICD-10-CM | POA: Diagnosis not present

## 2015-07-02 DIAGNOSIS — Z79899 Other long term (current) drug therapy: Secondary | ICD-10-CM | POA: Insufficient documentation

## 2015-07-02 DIAGNOSIS — I10 Essential (primary) hypertension: Secondary | ICD-10-CM | POA: Diagnosis not present

## 2015-07-02 DIAGNOSIS — R51 Headache: Secondary | ICD-10-CM | POA: Insufficient documentation

## 2015-07-02 DIAGNOSIS — R61 Generalized hyperhidrosis: Secondary | ICD-10-CM | POA: Insufficient documentation

## 2015-07-02 DIAGNOSIS — M545 Low back pain: Secondary | ICD-10-CM | POA: Diagnosis not present

## 2015-07-02 DIAGNOSIS — K219 Gastro-esophageal reflux disease without esophagitis: Secondary | ICD-10-CM | POA: Diagnosis not present

## 2015-07-02 DIAGNOSIS — R42 Dizziness and giddiness: Secondary | ICD-10-CM | POA: Diagnosis present

## 2015-07-02 DIAGNOSIS — R10A1 Flank pain, right side: Secondary | ICD-10-CM

## 2015-07-02 LAB — CBC
HEMATOCRIT: 42.2 % (ref 36.0–46.0)
HEMOGLOBIN: 14.5 g/dL (ref 12.0–15.0)
MCH: 31.8 pg (ref 26.0–34.0)
MCHC: 34.4 g/dL (ref 30.0–36.0)
MCV: 92.5 fL (ref 78.0–100.0)
Platelets: 292 10*3/uL (ref 150–400)
RBC: 4.56 MIL/uL (ref 3.87–5.11)
RDW: 12.9 % (ref 11.5–15.5)
WBC: 10 10*3/uL (ref 4.0–10.5)

## 2015-07-02 LAB — COMPREHENSIVE METABOLIC PANEL
ALBUMIN: 4.4 g/dL (ref 3.5–5.0)
ALK PHOS: 89 U/L (ref 38–126)
ALT: 31 U/L (ref 14–54)
ANION GAP: 13 (ref 5–15)
AST: 41 U/L (ref 15–41)
BUN: 15 mg/dL (ref 6–20)
CHLORIDE: 100 mmol/L — AB (ref 101–111)
CO2: 26 mmol/L (ref 22–32)
Calcium: 10.3 mg/dL (ref 8.9–10.3)
Creatinine, Ser: 1.94 mg/dL — ABNORMAL HIGH (ref 0.44–1.00)
GFR calc Af Amer: 33 mL/min — ABNORMAL LOW (ref >60)
GFR, EST NON AFRICAN AMERICAN: 29 mL/min — AB (ref >60)
Glucose, Bld: 97 mg/dL (ref 65–99)
POTASSIUM: 4.9 mmol/L (ref 3.5–5.1)
Sodium: 139 mmol/L (ref 135–145)
Total Bilirubin: 0.6 mg/dL (ref 0.3–1.2)
Total Protein: 8.2 g/dL — ABNORMAL HIGH (ref 6.5–8.1)

## 2015-07-02 LAB — SEDIMENTATION RATE: Sed Rate: 33 mm/hr — ABNORMAL HIGH (ref 0–22)

## 2015-07-02 LAB — URINALYSIS, ROUTINE W REFLEX MICROSCOPIC
GLUCOSE, UA: NEGATIVE mg/dL
Ketones, ur: 15 mg/dL — AB
NITRITE: NEGATIVE
PH: 6 (ref 5.0–8.0)
PROTEIN: 30 mg/dL — AB
Specific Gravity, Urine: 1.017 (ref 1.005–1.030)
Urobilinogen, UA: 0.2 mg/dL (ref 0.0–1.0)

## 2015-07-02 LAB — URINE MICROSCOPIC-ADD ON

## 2015-07-02 LAB — I-STAT TROPONIN, ED: Troponin i, poc: 0 ng/mL (ref 0.00–0.08)

## 2015-07-02 LAB — LIPASE, BLOOD: LIPASE: 51 U/L (ref 22–51)

## 2015-07-02 MED ORDER — METOCLOPRAMIDE HCL 5 MG/ML IJ SOLN
10.0000 mg | Freq: Once | INTRAMUSCULAR | Status: AC
  Administered 2015-07-02: 10 mg via INTRAVENOUS
  Filled 2015-07-02: qty 2

## 2015-07-02 MED ORDER — ONDANSETRON 4 MG PO TBDP: 4.0000 mg | ORAL_TABLET | Freq: Three times a day (TID) | ORAL | Status: DC | PRN

## 2015-07-02 MED ORDER — MORPHINE SULFATE 4 MG/ML IJ SOLN
4.0000 mg | Freq: Once | INTRAMUSCULAR | Status: AC
  Administered 2015-07-02: 4 mg via INTRAVENOUS
  Filled 2015-07-02: qty 1

## 2015-07-02 MED ORDER — ONDANSETRON HCL 4 MG/2ML IJ SOLN
4.0000 mg | Freq: Once | INTRAMUSCULAR | Status: AC
  Administered 2015-07-02: 4 mg via INTRAVENOUS
  Filled 2015-07-02: qty 2

## 2015-07-02 MED ORDER — SODIUM CHLORIDE 0.9 % IV BOLUS (SEPSIS)
1000.0000 mL | Freq: Once | INTRAVENOUS | Status: AC
  Administered 2015-07-02: 1000 mL via INTRAVENOUS

## 2015-07-02 MED ORDER — CEPHALEXIN 500 MG PO CAPS: 500.0000 mg | ORAL_CAPSULE | Freq: Two times a day (BID) | ORAL | Status: DC

## 2015-07-02 MED ORDER — DEXTROSE 5 % IV SOLN
1.0000 g | Freq: Once | INTRAVENOUS | Status: AC
  Administered 2015-07-02: 1 g via INTRAVENOUS
  Filled 2015-07-02: qty 10

## 2015-07-02 MED ORDER — GI COCKTAIL ~~LOC~~
30.0000 mL | Freq: Once | ORAL | Status: AC
  Administered 2015-07-02: 30 mL via ORAL
  Filled 2015-07-02: qty 30

## 2015-07-02 NOTE — Discharge Instructions (Signed)
Call your doctor's office tomorrow morning to get a follow-up appointment at next available. You will need to see your primary doctor to discuss reevaluation of your kidney function. Continue to make good effort to stay hydrated by drinking water. Take antibiotic prescribed as directed as your urine appeared consistent with probable infection.

## 2015-07-02 NOTE — ED Notes (Signed)
Patient admits that she started new diet on Monday, more healthy.  Patient states today she noticed dizziness with blurred vision.   She is nauseated and feels like something is in her stomach.  Patient is alert.  Patient sx started at 0830 today.

## 2015-07-02 NOTE — ED Provider Notes (Signed)
Patient seen/examined in the Emergency Department in conjunction with Resident Physician Provider  Patient reports right flank pain/nausea with h/o kidney stone Exam : awake/alert, no distress, right CVAT noted Plan: will get CT imaging to evaluate for ureterolithiasis    Ripley Fraise, MD 07/02/15 1845

## 2015-07-02 NOTE — ED Provider Notes (Signed)
CSN: [id number]     Arrival date & time 07/02/15  1432 History   First MD Initiated Contact with Patient 07/02/15 1736     Chief Complaint  Patient presents with  . Dizziness  . Excessive Sweating  . Morning Sickness  . Abdominal Pain  . Blurred Vision     (Consider location/radiation/quality/duration/timing/severity/associated sxs/prior Treatment) The history is provided by the patient.  Patient is a 54 year old female with past medical history of hypertension, hyperlipidemia, spinal stenosis of L4 and L5 who presents today with nausea vomiting and diaphoresis that started within the last few hours. Patient relates she works at 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 She relates today that she gradually started to feel sweaty and diaphoretic. She also relates that during this time she have right flank pain that she describes as sharp and similar to her kidney stones in the past. She denies any hematuria or dysuria that she's noticed. No diarrhea as well. She denies any chest pain with this. She describes her nausea spell like she needs to throw up she doesn't have anything in her stomach to throat. She denies any changes in her medications recently. She does relate over the last 5 days she's changed her diet and eats more fruits and vegetables and is change from regular salt to sea salt. She denies any recent fevers or chills. No recent UTI symptoms. She relates she's been drinking like normal and has tried to stay hydrated.  Past Medical History  Diagnosis Date  . Hypertension   . Elevated cholesterol   . GERD (gastroesophageal reflux disease)   . Seizures     2 months ago was last episode of uncontrollable rt arm motions and passed out  . Nephrolithiasis   . Migraine   . Other and unspecified hyperlipidemia   . Leiomyoma of uterus 10/22/2011  . Bartholin's gland cyst 10/22/2011  . Spinal stenosis 11/2014    L4-L5, L leg pain   Past Surgical History  Procedure Laterality Date  .  Lithotripsy    . Cesarean section      x2  . Knee arthroscopy      x2  . Thumb surg    . Robotic assisted lap vaginal hysterectomy  10/25/2011    Procedure: ROBOTIC ASSISTED LAPAROSCOPIC VAGINAL HYSTERECTOMY;  Surgeon: Agnes Lawrence, MD;  Location: [facility];  Service: Gynecology;  Laterality: N/A;  Marsupralization og bartholin gland, cyst biopsy  . Abdominal hysterectomy     Family History  Problem Relation Age of Onset  . Diabetes Mother   . Hyperlipidemia Mother   . Hypertension Mother   . Breast cancer Mother 38    s/p bilateral mastectomy  . Diabetes Father   . Hyperlipidemia Father   . Hypertension Father   . Hypertension Sister   . Bipolar disorder Daughter   . Asthma Daughter   . Diabetes Daughter   . Bipolar disorder Son    Social History  Substance Use Topics  . Smoking status: Never Smoker   . Smokeless tobacco: Never Used  . Alcohol Use: No   OB History    No data available     Review of Systems  Constitutional: Positive for diaphoresis (as described in HPI). Negative for fever and chills.  HENT: Negative for congestion and rhinorrhea.   Eyes: Positive for visual disturbance (intermittent "blurriness" over last week). Negative for photophobia.  Respiratory: Negative for shortness of breath and wheezing.   Gastrointestinal: Positive for nausea. Negative for vomiting,  abdominal pain and diarrhea.  Genitourinary: Positive for flank pain (L flank pain).  Musculoskeletal: Positive for back pain (chronic lower back pain, not worsened). Negative for arthralgias.  Neurological: Positive for headaches (R side headache last 1 week). Negative for light-headedness.  Psychiatric/Behavioral: Negative for confusion and agitation.  All other systems reviewed and are negative.     Allergies  Vicodin  Home Medications   Prior to Admission medications   Medication Sig Start Date End Date Taking? Authorizing Provider  atorvastatin (LIPITOR) 20 MG tablet Take 1  tablet (20 mg total) by mouth daily after supper. 12/27/14  Yes Darlyne Russian, MD  levETIRAcetam (KEPPRA) 500 MG tablet 250mg  in am, 500mg  in  pm. Patient taking differently: Take 250-500 mg by mouth 2 (two) times daily. 250mg  in am, 500mg  in  pm. 04/25/15  Yes Dennie Bible, NP  levocetirizine (XYZAL) 5 MG tablet Take 1 tablet (5 mg total) by mouth every evening. 12/27/14 12/27/15 Yes Darlyne Russian, MD  Multiple Vitamins-Minerals (MULTIVITAMINS THER. W/MINERALS) TABS Take 1 tablet by mouth daily.     Yes Historical Provider, MD  cephALEXin (KEFLEX) 500 MG capsule Take 1 capsule (500 mg total) by mouth 2 (two) times daily. 07/02/15   Theodosia Quay, MD  ondansetron (ZOFRAN ODT) 4 MG disintegrating tablet Take 1 tablet (4 mg total) by mouth every 8 (eight) hours as needed for nausea or vomiting. 07/02/15   Theodosia Quay, MD   BP 118/63 mmHg  Pulse 90  Temp(Src) 98 F (36.7 C) (Oral)  Resp 23  Wt 167 lb (75.751 kg)  SpO2 97%  LMP 09/19/2011 Physical Exam  Constitutional: She is oriented to person, place, and time. No distress.  Appears uncomfortable  HENT:  Head: Normocephalic and atraumatic.  Eyes: Conjunctivae and EOM are normal.  Fundoscopic exam:      The right eye shows no hemorrhage and no papilledema.       The left eye shows no hemorrhage and no papilledema.  Neck: Normal range of motion. Neck supple.  Cardiovascular: Normal rate, regular rhythm and normal heart sounds.   No murmur heard. Pulmonary/Chest: Effort normal and breath sounds normal.  Abdominal: Soft. Bowel sounds are normal. There is no tenderness. There is no rebound and no guarding.  Musculoskeletal: Normal range of motion. She exhibits tenderness (Mild R flank ttp).  Neurological: She is alert and oriented to person, place, and time.  Skin: Skin is warm and dry. She is not diaphoretic.  Psychiatric: She has a normal mood and affect. Her behavior is normal.  Nursing note and vitals reviewed.   ED Course  Procedures  (including critical care time) Labs Review Labs Reviewed  COMPREHENSIVE METABOLIC PANEL - Abnormal; Notable for the following:    Chloride 100 (*)    Creatinine, Ser 1.94 (*)    Total Protein 8.2 (*)    GFR calc non Af Amer 29 (*)    GFR calc Af Amer 33 (*)    All other components within normal limits  URINALYSIS, ROUTINE W REFLEX MICROSCOPIC (NOT AT [facility]) - Abnormal; Notable for the following:    Color, Urine AMBER (*)    APPearance CLOUDY (*)    Hgb urine dipstick TRACE (*)    Bilirubin Urine SMALL (*)    Ketones, ur 15 (*)    Protein, ur 30 (*)    Leukocytes, UA LARGE (*)    All other components within normal limits  URINE MICROSCOPIC-ADD ON - Abnormal; Notable for the following:  Squamous Epithelial / LPF FEW (*)    Bacteria, UA MANY (*)    Casts HYALINE CASTS (*)    All other components within normal limits  SEDIMENTATION RATE - Abnormal; Notable for the following:    Sed Rate 33 (*)    All other components within normal limits  LIPASE, BLOOD  CBC  I-STAT TROPOININ, ED    Imaging Review Ct Renal Stone Study  07/02/2015   CLINICAL DATA:  Acute onset of dizziness and blurred vision. Nausea. Sensation of object in stomach. Initial encounter.  EXAM: CT ABDOMEN AND PELVIS WITHOUT CONTRAST  TECHNIQUE: Multidetector CT imaging of the abdomen and pelvis was performed following the standard protocol without IV contrast.  COMPARISON:  Abdominal radiograph performed 01/31/2014, and CT of the abdomen and pelvis from 05/06/2011  FINDINGS: The visualized lung bases are clear.  The liver and spleen are unremarkable in appearance. The gallbladder is within normal limits. The pancreas and adrenal glands are unremarkable.  A 7 mm stone is noted at the interpole region of the right kidney. Parenchymal calcification at the lower pole of right kidney likely reflects prior injury. The left kidney is unremarkable in appearance. There is no evidence of hydronephrosis. No obstructing ureteral stones  are identified.  No free fluid is identified. The small bowel is unremarkable in appearance. The stomach is within normal limits. No acute vascular abnormalities are seen.  The appendix is normal in caliber and contains air, without evidence of appendicitis. The colon is unremarkable in appearance.  The bladder is decompressed and not well assessed. The ovaries are grossly symmetric. No suspicious adnexal masses are seen. The patient is status post hysterectomy. No inguinal lymphadenopathy is seen.  No acute osseous abnormalities are identified. There is mild grade 1 anterolisthesis of L4 on L5, reflecting underlying facet disease.  IMPRESSION: 1. No acute abnormality seen to explain the patient's symptoms. 2. 7 mm nonobstructing stone at the interpole region of the right kidney. 3. Mild grade 1 anterolisthesis of L4 on L5, reflecting underlying facet disease.   Electronically Signed   By: Garald Balding M.D.   On: 07/02/2015 19:21   I, Theodosia Quay, personally reviewed and evaluated these images and lab results as part of my medical decision-making.   EKG Interpretation   Date/Time:  Sunday July 02 2015 14:58:06 EDT Ventricular Rate:  100 PR Interval:  132 QRS Duration: 78 QT Interval:  340 QTC Calculation: 438 R Axis:   41 Text Interpretation:  Normal sinus rhythm Nonspecific T wave abnormality  changes from prior Confirmed by Christy Gentles  MD, DONALD ([id number]) on 07/02/2015  5:48:41 PM      MDM   Final diagnoses:  Right flank pain  Elevated serum creatinine  UTI (lower urinary tract infection)   Patient is a 54 year old female with past medical history of hypertension, hyperlipidemia, spinal stenosis of L4 and L5 who presents today with nausea vomiting and diaphoresis that started within the last few hours.   Obtained a CT stone study to rule out stone that could be causing patient's increased creatinine. No ureteral stone was seen. Patient was notably nauseous although not vomiting but  stating she fell a she needed to. Try to get her to tolerate by mouth fluids however she started spitting them back up. Had initially tried Zofran but then tried Reglan IV with a GI cocktail. On reevaluation patient seemed much more improved. Patient was then tolerating by mouth intake without problems.  Patient does relate that she felt very  sweaty and works in a Careers information officer regularly. Her increased creatinine although only a moderate increase, could be prerenal etiology. Given the patient is able to tolerate fluids by mouth believe she would be appropriate for outpatient workup. I suggested that she follow-up with her primary doctor sometime this coming week for further evaluation of this.  Urine did appear convincing for urinary tract infection. Started the patient on Keflex 500 mg twice a day. Also provided patient with Zofran prescription as well. Patient is agreeable with plan to treat as outpatient. Patient was discharged home in good condition.    Theodosia Quay, MD 07/03/15 3358  Ripley Fraise, MD 07/03/15 1728

## 2015-07-13 ENCOUNTER — Ambulatory Visit: Payer: Federal, State, Local not specified - PPO

## 2015-08-11 ENCOUNTER — Encounter: Payer: Self-pay | Admitting: *Deleted

## 2015-08-15 ENCOUNTER — Ambulatory Visit
Admission: RE | Admit: 2015-08-15 | Discharge: 2015-08-15 | Disposition: A | Discharge status: Home / Self Care | Payer: Federal, State, Local not specified - PPO | Source: Ambulatory Visit

## 2015-08-15 DIAGNOSIS — Z1231 Encounter for screening mammogram for malignant neoplasm of breast: Secondary | ICD-10-CM

## 2015-10-31 ENCOUNTER — Ambulatory Visit: Payer: BLUE CROSS/BLUE SHIELD | Admitting: Nurse Practitioner

## 2015-11-14 ENCOUNTER — Ambulatory Visit: Payer: BLUE CROSS/BLUE SHIELD | Admitting: Nurse Practitioner

## 2015-12-05 ENCOUNTER — Ambulatory Visit (INDEPENDENT_AMBULATORY_CARE_PROVIDER_SITE_OTHER): Payer: Federal, State, Local not specified - PPO | Admitting: Nurse Practitioner

## 2015-12-05 ENCOUNTER — Encounter: Payer: Self-pay | Admitting: Nurse Practitioner

## 2015-12-05 VITALS — BP 135/80 | HR 67 | Ht 66.0 in | Wt 179.0 lb

## 2015-12-05 DIAGNOSIS — R569 Unspecified convulsions: Secondary | ICD-10-CM

## 2015-12-05 DIAGNOSIS — G43909 Migraine, unspecified, not intractable, without status migrainosus: Secondary | ICD-10-CM | POA: Diagnosis not present

## 2015-12-05 MED ORDER — LEVETIRACETAM 500 MG PO TABS: ORAL_TABLET | ORAL | Status: DC

## 2015-12-05 NOTE — Progress Notes (Signed)
[facility]  PATIENT: Hannah Nguyen DOB: [birth year]   REASON FOR VISIT: Follow-up for seizure disorder, migraines HISTORY FROM: Patient    HISTORY OF PRESENT ILLNESS:Hannah Nguyen is a 55 year old female with a history of migraines and seizure disorder. She returns today for follow-up. She is currently taking Keppra 750mg  daily- 500 mg in the PM and 250 mg in the AM. She reports that her last seizure was almost 3.5 years ago. She operates a Teacher, music without difficulty. At home she can complete all ADLs independently. No new neurological complaints. She reports that she has stenosis in the lumbar region at L4-L5. She is  having some  issues with this now and her orthopedic doctor will start epidural steroid injections. She continues to work two part-time jobs. She tries to be active. She needs refills on her Keppra.   History 03/16/14 (CM): 55 year old female returns for followup. She has a history of seizure disorder. She was last seen by Hannah Nguyen 10/07/2013 ,she was placed on extended release Keppra and the patient claims she had side effects to the medication. She called into the office and did not get a response therefore she went back to taking her 500 mg of Keppra at night only. No seizure events in a couple of years. Appetite is reportedly good, she does have some difficulty sleeping at times due to muscle pain in her left thigh. She is due to see a orthopedist. She had a recent CBC and CMP by primary care which was reviewed. She was made aware she's taking suboptimal dose of Keppra. She returns for reevaluation. HISTORY (YY): She has past medical history of hypertension, hyperlipidemia, migraine headaches, presenting with passing out episode in January 29, 2010.  she was with her family at home, after watching TV for a while in January 29 2010, she decided to fix something for her child, she remember walking to the kitchen, the next thing, she woke up on the  floor, EMS were there, all rounding her, she apparently had a passout, there was no evidence she had hurt herself, no evidence of trauma, urinary incontinence, there was no tongue biting. Family started trying to arouse her by rubbing her hands, shaking her, 3-5 minutes before she was able to be aroused completely, but that time EMS has arrived.  She was admitted to hospital on January 29 2010, during her first episode, she was started on Keppra 500 mg twice a day, but she is not taking it regularly. Echocardiogram showed ejection fraction 60-65%, EKG and telemetry monitoring was normal, CT scan of the head was normal.  Since the initial episode, she had episode of uncontrollable right arm shaking, lasting few minutes, without loss of consciousness, she often feel hot, with elevated blood pressure during the episode, this can happen 3 times or evenl more times in a week.  Since Keppra was started, she has much less recurrent episode, and denies any since last seen 08/07/11. Her episode in the past described as stereotypical intermittent right arm uncontrollable shaking, lasting 2-3 minutes, not under her voluntary controlled, no associated right face or right lower extremity shaking. Keppra make her drowsy. EEG showed intermittent right central slowing, occasionally right P4 sharp transient. MRI of the brain has demonstrated supratentorial small vessel disease,atrophy more than expected for her age.     REVIEW OF SYSTEMS: Full 14 system review of systems performed and notable only for those listed, all others are neg:  Constitutional: neg  Cardiovascular: neg  Ear/Nose/Throat: neg  Skin: neg Eyes: neg Respiratory: neg Gastroitestinal: neg  Hematology/Lymphatic: neg  Endocrine: neg Musculoskeletal: Aching muscles Allergy/Immunology: neg Neurological: History of seizure disorder and headaches Psychiatric: Depression and anxiety Sleep : neg   ALLERGIES: Allergies  Allergen Reactions  .  Vicodin [Hydrocodone-Acetaminophen] Hives and Itching    HOME MEDICATIONS: Outpatient Prescriptions Prior to Visit  Medication Sig Dispense Refill  . atorvastatin (LIPITOR) 20 MG tablet Take 1 tablet (20 mg total) by mouth daily after supper. 30 tablet 11  . levETIRAcetam (KEPPRA) 500 MG tablet 250mg  in am, 500mg  in  pm. (Patient taking differently: Take 250-500 mg by mouth 2 (two) times daily. 250mg  in am, 500mg  in  pm.) 45 tablet 6  . Multiple Vitamins-Minerals (MULTIVITAMINS THER. W/MINERALS) TABS Take 1 tablet by mouth daily.      . cephALEXin (KEFLEX) 500 MG capsule Take 1 capsule (500 mg total) by mouth 2 (two) times daily. (Patient not taking: Reported on 12/05/2015) 14 capsule 0  . levocetirizine (XYZAL) 5 MG tablet Take 1 tablet (5 mg total) by mouth every evening. (Patient not taking: Reported on 12/05/2015) 30 tablet 11  . ondansetron (ZOFRAN ODT) 4 MG disintegrating tablet Take 1 tablet (4 mg total) by mouth every 8 (eight) hours as needed for nausea or vomiting. (Patient not taking: Reported on 12/05/2015) 20 tablet 0   No facility-administered medications prior to visit.    PAST MEDICAL HISTORY: Past Medical History  Diagnosis Date  . Hypertension   . Elevated cholesterol   . GERD (gastroesophageal reflux disease)   . Seizures ([place])     2 months ago was last episode of uncontrollable rt arm motions and passed out  . Nephrolithiasis   . Migraine   . Other and unspecified hyperlipidemia   . Leiomyoma of uterus 10/22/2011  . Bartholin's gland cyst 10/22/2011  . Spinal stenosis 11/2014    L4-L5, L leg pain  . Kidney infection 05/2015    PAST SURGICAL HISTORY: Past Surgical History  Procedure Laterality Date  . Lithotripsy    . Cesarean section      x2  . Knee arthroscopy      x2  . Thumb surg    . Robotic assisted lap vaginal hysterectomy  10/25/2011    Procedure: ROBOTIC ASSISTED LAPAROSCOPIC VAGINAL HYSTERECTOMY;  Surgeon: Agnes Lawrence, MD;  Location: [facility];   Service: Gynecology;  Laterality: N/A;  Marsupralization og bartholin gland, cyst biopsy  . Abdominal hysterectomy      FAMILY HISTORY: Family History  Problem Relation Age of Onset  . Diabetes Mother   . Hyperlipidemia Mother   . Hypertension Mother   . Breast cancer Mother 18    s/p bilateral mastectomy  . Diabetes Father   . Hyperlipidemia Father   . Hypertension Father   . Hypertension Sister   . Bipolar disorder Daughter   . Asthma Daughter   . Diabetes Daughter   . Bipolar disorder Son     SOCIAL HISTORY: Social History   Social History  . Marital Status: Married    Spouse Name: Owens Shark   . Number of Children: 2  . Years of Education: 13   Occupational History  . [employer]   Social History Main Topics  . Smoking status: Never Smoker   . Smokeless tobacco: Never Used  . Alcohol Use: No  . Drug Use: No  . Sexual Activity: Not on file   Other Topics Concern  . Not on file   Social History  Narrative   Patient lives at home with her husband    Patient works full time.   Right handed.    Caffeine- three sodas daily.     PHYSICAL EXAM  Filed Vitals:   12/05/15 0810  BP: 135/80  Pulse: 67  Height: 5\' 6"  (1.676 m)  Weight: 179 lb (81.194 kg)   Body mass index is 28.91 kg/(m^2). Generalized: Well developed, in no acute distress  Head: normocephalic and atraumatic,. Oropharynx benign  Musculoskeletal: No deformity   Neurological examination  Mentation: Alert oriented to time, place, history taking. Attention span and concentration appropriate. Recent and remote memory intact. Follows all commands speech and language fluent.  Cranial nerve II-XII: Pupils were equal round reactive to light extraocular movements were full, visual field were full on confrontational test. Facial sensation and strength were normal. hearing was intact to finger rubbing bilaterally. Uvula tongue midline. head turning and shoulder shrug were normal and  symmetric.Tongue protrusion into cheek strength was normal. Motor: normal bulk and tone, full strength in the BUE, BLE, fine finger movements normal, no pronator drift. No focal weakness Coordination: finger-nose-finger, heel-to-shin bilaterally, no dysmetria Reflexes: Symmetric upper and lower, plantar responses were flexor bilaterally. Gait and Station: Rising up from seated position without assistance, normal stance, moderate stride, good arm swing, smooth turning, able to perform tiptoe, and heel walking without difficulty. Tandem gait is unsteady. No assistive device Romberg negative   DIAGNOSTIC DATA (LABS, IMAGING, TESTING) - I reviewed patient records, labs, notes, testing and imaging myself where available.  Lab Results  Component Value Date   WBC 10.0 07/02/2015   HGB 14.5 07/02/2015   HCT 42.2 07/02/2015   MCV 92.5 07/02/2015   PLT 292 07/02/2015      Component Value Date/Time   NA 139 07/02/2015 1505   K 4.9 07/02/2015 1505   CL 100* 07/02/2015 1505   CO2 26 07/02/2015 1505   GLUCOSE 97 07/02/2015 1505   BUN 15 07/02/2015 1505   CREATININE 1.94* 07/02/2015 1505   CREATININE 0.74 03/07/2015 1022   CALCIUM 10.3 07/02/2015 1505   PROT 8.2* 07/02/2015 1505   ALBUMIN 4.4 07/02/2015 1505   AST 41 07/02/2015 1505   ALT 31 07/02/2015 1505   ALKPHOS 89 07/02/2015 1505   BILITOT 0.6 07/02/2015 1505   GFRNONAA 29* 07/02/2015 1505   GFRNONAA >89 12/27/2014 1342   GFRAA 33* 07/02/2015 1505   GFRAA >89 12/27/2014 1342   Lab Results  Component Value Date   CHOL 147 03/07/2015   HDL 48 03/07/2015   LDLCALC 80 03/07/2015   TRIG 96 03/07/2015   CHOLHDL 3.1 03/07/2015    No results found for: [id number] Lab Results  Component Value Date   TSH 0.897 03/07/2015      ASSESSMENT AND PLAN 55 y.o. year old female has a past medical history of Hypertension; Elevated cholesterol; Seizures; Nephrolithiasis; Migraine; and Spinal stenosis (11/2014). here to follow-up for her  seizure disorder and her migraines which are in good control. Last seizure 3.5 years ago.  Continue Keppra at current dose will refill  Call for any seizure activity Continue to be active as much as possible for overall health and well-being Follow-up in 1 year Dennie Bible, [facility], [facility], APRN  [facility] [address] [place], [place] [address] [phone]

## 2015-12-05 NOTE — Progress Notes (Signed)
I have reviewed and agreed above plan. 

## 2015-12-05 NOTE — Patient Instructions (Signed)
Continue Keppra at current dose will refill  Call for any seizure activity Continue to be active as much as possible for overall health and well-being Follow-up in 1 year 

## 2015-12-07 ENCOUNTER — Other Ambulatory Visit: Payer: Self-pay | Admitting: Emergency Medicine

## 2015-12-14 ENCOUNTER — Ambulatory Visit (INDEPENDENT_AMBULATORY_CARE_PROVIDER_SITE_OTHER): Payer: Federal, State, Local not specified - PPO | Admitting: Physician Assistant

## 2015-12-14 VITALS — BP 128/88 | HR 80 | Temp 97.5°F | Resp 18 | Ht 65.5 in | Wt 180.0 lb

## 2015-12-14 DIAGNOSIS — I10 Essential (primary) hypertension: Secondary | ICD-10-CM

## 2015-12-14 DIAGNOSIS — M25562 Pain in left knee: Secondary | ICD-10-CM | POA: Diagnosis not present

## 2015-12-14 DIAGNOSIS — E785 Hyperlipidemia, unspecified: Secondary | ICD-10-CM

## 2015-12-14 MED ORDER — NAPROXEN 500 MG PO TABS: 500.0000 mg | ORAL_TABLET | Freq: Two times a day (BID) | ORAL | Status: DC

## 2015-12-14 NOTE — Patient Instructions (Signed)
I am going to check your kidney function prior to prescribing a medication for your pain.   In the meantime, restrict your diet with following the DASH plan listed below.   DASH Eating Plan DASH stands for "Dietary Approaches to Stop Hypertension." The DASH eating plan is a healthy eating plan that has been shown to reduce high blood pressure (hypertension). Additional health benefits may include reducing the risk of type 2 diabetes mellitus, heart disease, and stroke. The DASH eating plan may also help with weight loss. WHAT DO I NEED TO KNOW ABOUT THE DASH EATING PLAN? For the DASH eating plan, you will follow these general guidelines:  Choose foods with a percent daily value for sodium of less than 5% (as listed on the food label).  Use salt-free seasonings or herbs instead of table salt or sea salt.  Check with your health care provider or pharmacist before using salt substitutes.  Eat lower-sodium products, often labeled as "lower sodium" or "no salt added."  Eat fresh foods.  Eat more vegetables, fruits, and low-fat dairy products.  Choose whole grains. Look for the word "whole" as the first word in the ingredient list.  Choose fish and skinless chicken or [place] more often than red meat. Limit fish, poultry, and meat to 6 oz (170 g) each day.  Limit sweets, desserts, sugars, and sugary drinks.  Choose heart-healthy fats.  Limit cheese to 1 oz (28 g) per day.  Eat more home-cooked food and less restaurant, buffet, and fast food.  Limit fried foods.  Cook foods using methods other than frying.  Limit canned vegetables. If you do use them, rinse them well to decrease the sodium.  When eating at a restaurant, ask that your food be prepared with less salt, or no salt if possible. WHAT FOODS CAN I EAT? Seek help from a dietitian for individual calorie needs. Grains Whole grain or whole wheat bread. Brown rice. Whole grain or whole wheat pasta. Quinoa, bulgur, and whole grain  cereals. Low-sodium cereals. Corn or whole wheat flour tortillas. Whole grain cornbread. Whole grain crackers. Low-sodium crackers. Vegetables Fresh or frozen vegetables (raw, steamed, roasted, or grilled). Low-sodium or reduced-sodium tomato and vegetable juices. Low-sodium or reduced-sodium tomato sauce and paste. Low-sodium or reduced-sodium canned vegetables.  Fruits All fresh, canned (in natural juice), or frozen fruits. Meat and Other Protein Products Ground beef (85% or leaner), grass-fed beef, or beef trimmed of fat. Skinless chicken or [place]. Ground chicken or [place]. Pork trimmed of fat. All fish and seafood. Eggs. Dried beans, peas, or lentils. Unsalted nuts and seeds. Unsalted canned beans. Dairy Low-fat dairy products, such as skim or 1% milk, 2% or reduced-fat cheeses, low-fat ricotta or cottage cheese, or plain low-fat yogurt. Low-sodium or reduced-sodium cheeses. Fats and Oils Tub margarines without trans fats. Light or reduced-fat mayonnaise and salad dressings (reduced sodium). Avocado. Safflower, olive, or canola oils. Natural peanut or almond butter. Other Unsalted popcorn and pretzels. The items listed above may not be a complete list of recommended foods or beverages. Contact your dietitian for more options. WHAT FOODS ARE NOT RECOMMENDED? Grains White bread. White pasta. White rice. Refined cornbread. Bagels and croissants. Crackers that contain trans fat. Vegetables Creamed or fried vegetables. Vegetables in a cheese sauce. Regular canned vegetables. Regular canned tomato sauce and paste. Regular tomato and vegetable juices. Fruits Dried fruits. Canned fruit in light or heavy syrup. Fruit juice. Meat and Other Protein Products Fatty cuts of meat. Ribs, chicken wings, bacon, sausage, bologna,  salami, chitterlings, fatback, hot dogs, bratwurst, and packaged luncheon meats. Salted nuts and seeds. Canned beans with salt. Dairy Whole or 2% milk, cream, half-and-half, and  cream cheese. Whole-fat or sweetened yogurt. Full-fat cheeses or blue cheese. Nondairy creamers and whipped toppings. Processed cheese, cheese spreads, or cheese curds. Condiments Onion and garlic salt, seasoned salt, table salt, and sea salt. Canned and packaged gravies. Worcestershire sauce. Tartar sauce. Barbecue sauce. Teriyaki sauce. Soy sauce, including reduced sodium. Steak sauce. Fish sauce. Oyster sauce. Cocktail sauce. Horseradish. Ketchup and mustard. Meat flavorings and tenderizers. Bouillon cubes. Hot sauce. Tabasco sauce. Marinades. Taco seasonings. Relishes. Fats and Oils Butter, stick margarine, lard, shortening, ghee, and bacon fat. Coconut, palm kernel, or palm oils. Regular salad dressings. Other Pickles and olives. Salted popcorn and pretzels. The items listed above may not be a complete list of foods and beverages to avoid. Contact your dietitian for more information. WHERE CAN I FIND MORE INFORMATION? National Heart, Lung, and Blood Institute: travelstabloid.com   This information is not intended to replace advice given to you by your health care provider. Make sure you discuss any questions you have with your health care provider.   Document Released: 10/24/2011 Document Revised: 11/25/2014 Document Reviewed: 09/08/2013 Elsevier Interactive Patient Education [employer].

## 2015-12-14 NOTE — Progress Notes (Signed)
[facility] [address] [place] [address] [phone]  Date:  12/14/2015   Name:  Hannah Nguyen   DOB:  1961/07/05   MRN:  [id number]  PCP:  JEFFERY,CHELLE, PA-C    History of Present Illness:  Hannah Nguyen is a 55 y.o. female patient who presents to [facility] for medication refill of the atorvastatin.   -She has not run out of her medication. -None of her symptoms that will warrant a HBP, for her, stating she has not had a headache, and some dizziness.However...she is not checking her bp. -No chest pains, palpitations, sob.  She will have some leg swellings -she is eating fruits and vegetables--without it being heavy at night.  She is avoiding fried foods.  Her biggest problem are soft drinks.   Patient also complains of right knee pain at the outer part of her knee that has been for years.  She was diagnosed with spinal stenosis, and states that ortho plans to do spinal injections, however she needs to resolve financial matters prior to setting an appointment.  Hurts more at rest when she is laying down.  Placing her right foot under her left foot makes it feel better at times.  Reports taking aleve, which at times helps to resolve her pain.  There is no weakness.   She has spinal stenosis.        Patient Active Problem List   Diagnosis Date Noted  . Spinal stenosis of lumbar region 03/07/2015  . BMI 28.0-28.9,adult 03/07/2015  . HTN (hypertension) 01/31/2014  . Hyperlipidemia 01/31/2014  . Migraine   . Partial seizure ([place]) 08/17/2012    Past Medical History  Diagnosis Date  . Hypertension   . Elevated cholesterol   . GERD (gastroesophageal reflux disease)   . Seizures ([place])     2 months ago was last episode of uncontrollable rt arm motions and passed out  . Nephrolithiasis   . Migraine   . Other and unspecified hyperlipidemia   . Leiomyoma of uterus 10/22/2011  . Bartholin's gland cyst 10/22/2011  . Spinal stenosis 11/2014    L4-L5, L leg  pain  . Kidney infection 05/2015    Past Surgical History  Procedure Laterality Date  . Lithotripsy    . Cesarean section      x2  . Knee arthroscopy      x2  . Thumb surg    . Robotic assisted lap vaginal hysterectomy  10/25/2011    Procedure: ROBOTIC ASSISTED LAPAROSCOPIC VAGINAL HYSTERECTOMY;  Surgeon: Agnes Lawrence, MD;  Location: [facility];  Service: Gynecology;  Laterality: N/A;  Marsupralization og bartholin gland, cyst biopsy  . Abdominal hysterectomy      Social History  Substance Use Topics  . Smoking status: Never Smoker   . Smokeless tobacco: Never Used  . Alcohol Use: No    Family History  Problem Relation Age of Onset  . Diabetes Mother   . Hyperlipidemia Mother   . Hypertension Mother   . Breast cancer Mother 22    s/p bilateral mastectomy  . Diabetes Father   . Hyperlipidemia Father   . Hypertension Father   . Hypertension Sister   . Bipolar disorder Daughter   . Asthma Daughter   . Diabetes Daughter   . Bipolar disorder Son     Allergies  Allergen Reactions  . Vicodin [Hydrocodone-Acetaminophen] Hives and Itching    Medication list has been reviewed and updated.  Current Outpatient Prescriptions on  File Prior to Visit  Medication Sig Dispense Refill  . atorvastatin (LIPITOR) 20 MG tablet Take 1 tablet (20 mg total) by mouth daily after supper. 30 tablet 11  . lisinopril-hydrochlorothiazide (PRINZIDE,ZESTORETIC) 20-25 MG tablet TAKE ONE TABLET BY MOUTH ONCE DAILY 90 tablet 0  . levETIRAcetam (KEPPRA) 500 MG tablet 250mg  in am, 500mg  in  pm. (Patient not taking: Reported on 12/14/2015) 45 tablet 11  . Multiple Vitamins-Minerals (MULTIVITAMINS THER. W/MINERALS) TABS Take 1 tablet by mouth daily.       No current facility-administered medications on file prior to visit.    ROS ROS otherwise unremarkable unless listed above.   Physical Examination: BP 144/76 mmHg  Pulse 80  Temp(Src) 97.5 F (36.4 C) (Oral)  Resp 18  Ht 5' 5.5" (1.664 m)   Wt 180 lb (81.647 kg)  BMI 29.49 kg/m2  SpO2 98%  LMP 09/19/2011 Ideal Body Weight: Weight in (lb) to have BMI = 25: 152.2  Physical Exam  Constitutional: She is oriented to person, place, and time. She appears well-developed and well-nourished. No distress.  HENT:  Head: Normocephalic and atraumatic.  Right Ear: External ear normal.  Left Ear: External ear normal.  Eyes: Conjunctivae and EOM are normal. Pupils are equal, round, and reactive to light.  Cardiovascular: Normal rate, regular rhythm and intact distal pulses.  Exam reveals no gallop and no friction rub.   No murmur heard. Pulses:      Carotid pulses are 2+ on the right side, and 2+ on the left side.      Radial pulses are 2+ on the right side, and 2+ on the left side.       Dorsalis pedis pulses are 2+ on the right side, and 2+ on the left side.  Pulmonary/Chest: Effort normal. No apnea. No respiratory distress. She has no wheezes.  Musculoskeletal:       Left knee: She exhibits abnormal meniscus. She exhibits normal range of motion, no swelling, no deformity and no erythema. Tenderness found. Medial joint line and lateral joint line tenderness noted. No patellar tendon tenderness noted.  Negative straight leg raise test.  Normal hip rotation.   No swelling or erythema.  Resisted flexion and extension incite pain at the lateral knee.    Neurological: She is alert and oriented to person, place, and time. She has normal strength. No cranial nerve deficit or sensory deficit.  Skin: She is not diaphoretic.  Psychiatric: She has a normal mood and affect. Her behavior is normal.     Assessment and Plan: Hannah Nguyen is a 55 y.o. female who is here today for cc of knee pain, and refill of her anti-hypertensives, and lipitor. Placing future order.  Advised to return.  i will refill medication at this time for 3 months.  Given DASH diet.  Left knee pain - Plan: COMPLETE METABOLIC PANEL WITH GFR, Lipid panel, DISCONTINUED:  naproxen (NAPROSYN) 500 MG tablet  Hyperlipidemia  Essential hypertension   Ivar Drape, PA-C [facility] 12/14/2015 12:34 PM

## 2015-12-17 ENCOUNTER — Encounter: Payer: Self-pay | Admitting: Physician Assistant

## 2015-12-18 ENCOUNTER — Other Ambulatory Visit (INDEPENDENT_AMBULATORY_CARE_PROVIDER_SITE_OTHER): Payer: Federal, State, Local not specified - PPO

## 2015-12-18 DIAGNOSIS — M25562 Pain in left knee: Secondary | ICD-10-CM

## 2015-12-18 LAB — LIPID PANEL
CHOLESTEROL: 176 mg/dL (ref 125–200)
HDL: 58 mg/dL (ref >=46)
LDL CALC: 101 mg/dL (ref <130)
TRIGLYCERIDES: 87 mg/dL (ref <150)
Total CHOL/HDL Ratio: 3 Ratio (ref <=5.0)
VLDL: 17 mg/dL (ref <30)

## 2015-12-18 LAB — COMPLETE METABOLIC PANEL WITH GFR
ALT: 31 U/L — ABNORMAL HIGH (ref 6–29)
AST: 27 U/L (ref 10–35)
Albumin: 4.1 g/dL (ref 3.6–5.1)
Alkaline Phosphatase: 97 U/L (ref 33–130)
BUN: 17 mg/dL (ref 7–25)
CALCIUM: 9.9 mg/dL (ref 8.6–10.4)
CHLORIDE: 102 mmol/L (ref 98–110)
CO2: 32 mmol/L — AB (ref 20–31)
Creat: 0.71 mg/dL (ref 0.50–1.05)
GFR, Est African American: >89 mL/min (ref >=60)
Glucose, Bld: 92 mg/dL (ref 65–99)
POTASSIUM: 4.3 mmol/L (ref 3.5–5.3)
SODIUM: 140 mmol/L (ref 135–146)
Total Bilirubin: 0.6 mg/dL (ref 0.2–1.2)
Total Protein: 7 g/dL (ref 6.1–8.1)

## 2016-01-17 ENCOUNTER — Other Ambulatory Visit: Payer: Self-pay | Admitting: Emergency Medicine

## 2016-02-12 ENCOUNTER — Ambulatory Visit (INDEPENDENT_AMBULATORY_CARE_PROVIDER_SITE_OTHER): Payer: Self-pay | Admitting: Physician Assistant

## 2016-02-12 VITALS — BP 136/84 | HR 89 | Temp 98.9°F | Resp 18 | Ht 65.35 in | Wt 177.0 lb

## 2016-02-12 DIAGNOSIS — J019 Acute sinusitis, unspecified: Secondary | ICD-10-CM

## 2016-02-12 DIAGNOSIS — E785 Hyperlipidemia, unspecified: Secondary | ICD-10-CM

## 2016-02-12 DIAGNOSIS — R059 Cough, unspecified: Secondary | ICD-10-CM

## 2016-02-12 DIAGNOSIS — R05 Cough: Secondary | ICD-10-CM

## 2016-02-12 MED ORDER — GUAIFENESIN ER 1200 MG PO TB12: 1.0000 | ORAL_TABLET | Freq: Two times a day (BID) | ORAL | Status: DC | PRN

## 2016-02-12 MED ORDER — AMOXICILLIN-POT CLAVULANATE 875-125 MG PO TABS: 1.0000 | ORAL_TABLET | Freq: Two times a day (BID) | ORAL | Status: DC

## 2016-02-12 MED ORDER — ATORVASTATIN CALCIUM 20 MG PO TABS: 20.0000 mg | ORAL_TABLET | Freq: Every day | ORAL | Status: DC

## 2016-02-12 MED ORDER — BENZONATATE 100 MG PO CAPS: 100.0000 mg | ORAL_CAPSULE | Freq: Three times a day (TID) | ORAL | Status: DC | PRN

## 2016-02-12 NOTE — Progress Notes (Signed)
[facility] [address] [place] [address] [phone]  Date:  02/12/2016   Name:  Hannah Nguyen   DOB:  [birth year]   MRN:  [id number]  PCP:  JEFFERY,CHELLE, PA-C    History of Present Illness:  Hannah Nguyen is a 55 y.o. female patient who presents to [facility] for chief complaint of nausea, congestion, cough, and HA.  Patient reports 1 week of symptoms of runny nose, nasal congestion and cough. She states that this cough  and headache.has had no prepped activity. She will have extreme runny nose and then congestion the next. She feels as if her chest is congested. She feels feverish though there is none. She has no trouble with breathing or shortness of breath. She feels more winded with her daily activities. She became concerned this morning when she woke up with a strong head pain of 10 out of 10, at this time it is 5 out of 10. She reports having a little bit of nausea, photophobia, phonophobia. She denies aura. Pain is on the right side of her head and throbbing. This is similar to migraines that she's had in the past. She reports feeling congestion in her face and sinus pressure. Mucus is thick and colored from her nostrils.    No hx of stroke or MI   Patient Active Problem List   Diagnosis Date Noted  . Spinal stenosis of lumbar region 03/07/2015  . BMI 28.0-28.9,adult 03/07/2015  . HTN (hypertension) 01/31/2014  . Hyperlipidemia 01/31/2014  . Migraine   . Partial seizure ([place]) 08/17/2012    Past Medical History  Diagnosis Date  . Hypertension   . Elevated cholesterol   . GERD (gastroesophageal reflux disease)   . Seizures ([place])     2 months ago was last episode of uncontrollable rt arm motions and passed out  . Nephrolithiasis   . Migraine   . Other and unspecified hyperlipidemia   . Leiomyoma of uterus 10/22/2011  . Bartholin's gland cyst 10/22/2011  . Spinal stenosis 11/2014    L4-L5, L leg pain  . Kidney infection 05/2015    Past  Surgical History  Procedure Laterality Date  . Lithotripsy    . Cesarean section      x2  . Knee arthroscopy      x2  . Thumb surg    . Robotic assisted lap vaginal hysterectomy  10/25/2011    Procedure: ROBOTIC ASSISTED LAPAROSCOPIC VAGINAL HYSTERECTOMY;  Surgeon: Agnes Lawrence, MD;  Location: [facility];  Service: Gynecology;  Laterality: N/A;  Marsupralization og bartholin gland, cyst biopsy  . Abdominal hysterectomy      Social History  Substance Use Topics  . Smoking status: Never Smoker   . Smokeless tobacco: Never Used  . Alcohol Use: No    Family History  Problem Relation Age of Onset  . Diabetes Mother   . Hyperlipidemia Mother   . Hypertension Mother   . Breast cancer Mother 76    s/p bilateral mastectomy  . Diabetes Father   . Hyperlipidemia Father   . Hypertension Father   . Hypertension Sister   . Bipolar disorder Daughter   . Asthma Daughter   . Diabetes Daughter   . Bipolar disorder Son     Allergies  Allergen Reactions  . Vicodin [Hydrocodone-Acetaminophen] Hives and Itching    Medication list has been reviewed and updated.  Current Outpatient Prescriptions on File Prior to Visit  Medication Sig Dispense Refill  .  atorvastatin (LIPITOR) 20 MG tablet Take 1 tablet (20 mg total) by mouth daily after supper. 30 tablet 11  . Cetirizine HCl (ZYRTEC ALLERGY PO) Take by mouth.    . levETIRAcetam (KEPPRA) 500 MG tablet 250mg  in am, 500mg  in  pm. 45 tablet 11  . lisinopril-hydrochlorothiazide (PRINZIDE,ZESTORETIC) 20-25 MG tablet TAKE ONE TABLET BY MOUTH ONCE DAILY 90 tablet 0  . Multiple Vitamins-Minerals (MULTIVITAMINS THER. W/MINERALS) TABS Take 1 tablet by mouth daily.       No current facility-administered medications on file prior to visit.    ROS ROS otherwise unremarkable unless listed above.  Physical Examination: BP 136/84 mmHg  Pulse 89  Temp(Src) 98.9 F (37.2 C) (Oral)  Resp 18  Ht 5' 5.35" (1.66 m)  Wt 177 lb (80.287 kg)  BMI  29.14 kg/m2  SpO2 98%  LMP 09/19/2011 Ideal Body Weight: Weight in (lb) to have BMI = 25: 151.6  Physical Exam  Constitutional: She is oriented to person, place, and time. She appears well-developed and well-nourished. No distress.  HENT:  Head: Normocephalic and atraumatic.  Right Ear: Tympanic membrane, external ear and ear canal normal.  Left Ear: Tympanic membrane, external ear and ear canal normal.  Nose: Mucosal edema and rhinorrhea present. Right sinus exhibits maxillary sinus tenderness. Right sinus exhibits no frontal sinus tenderness. Left sinus exhibits maxillary sinus tenderness. Left sinus exhibits no frontal sinus tenderness.  Mouth/Throat: No uvula swelling. No oropharyngeal exudate, posterior oropharyngeal edema or posterior oropharyngeal erythema.  Eyes: Conjunctivae and EOM are normal. Pupils are equal, round, and reactive to light.  Cardiovascular: Normal rate and regular rhythm.  Exam reveals no gallop, no distant heart sounds and no friction rub.   No murmur heard. Pulmonary/Chest: Effort normal. No respiratory distress. She has no decreased breath sounds. She has no wheezes. She has no rhonchi.  Lymphadenopathy:       Head (right side): No submandibular, no tonsillar, no preauricular and no posterior auricular adenopathy present.       Head (left side): No submandibular, no tonsillar, no preauricular and no posterior auricular adenopathy present.  Neurological: She is alert and oriented to person, place, and time.  Skin: She is not diaphoretic.  Psychiatric: She has a normal mood and affect. Her behavior is normal.     Assessment and Plan: Hannah Nguyen is a 55 y.o. female who is here today for headache, cough, and refill of her atorvastatin. She is doing a good job on her cholesterol and we will go ahead and refill for one year. This could be migraines which she is accustomed to, or a sinus infection. She does have congestion along with sinus pressure and  tenderness. We will go ahead and treat after 7 days with no improvement of her congestion. Advised hydration at this time and Tessalon Perles for cough.  advised to use excedrin or ibuprofen for head pain at this time.  Hyperlipidemia - Plan: atorvastatin (LIPITOR) 20 MG tablet  Subacute sinusitis, unspecified location - Plan: amoxicillin-clavulanate (AUGMENTIN) 875-125 MG tablet, benzonatate (TESSALON) 100 MG capsule, Guaifenesin (MUCINEX MAXIMUM STRENGTH) 1200 MG TB12  Cough - Plan: benzonatate (TESSALON) 100 MG capsule, Guaifenesin (MUCINEX MAXIMUM STRENGTH) 1200 MG TB12  Ivar Drape, PA-C [facility] 3/27/20179:56 PM

## 2016-02-12 NOTE — Patient Instructions (Addendum)
     IF you received an x-ray today, you will receive an invoice from [facility]. Please contact [facility] at [phone] with questions or concerns regarding your invoice.   IF you received labwork today, you will receive an invoice from [employer]. Please contact [facility] at [phone] with questions or concerns regarding your invoice.   Our billing staff will not be able to assist you with questions regarding bills from these companies.  You will be contacted with the lab results as soon as they are available. The fastest way to get your results is to activate your My Chart account. Instructions are located on the last page of this paperwork. If you have not heard from [place] regarding the results in 2 weeks, please contact this office.     Please hydrate well with 64 oz of water per day. Take the mucinex 1200mg  ever 12 hours. Please take excedrin or ibuprofen for your head pain.   And antibiotic as prescribed. We can follow up with the diabetes testing when you would like.

## 2016-03-16 ENCOUNTER — Other Ambulatory Visit: Payer: Self-pay | Admitting: Physician Assistant

## 2016-03-17 NOTE — Telephone Encounter (Signed)
Hannah Nguyen patient saw you 02/12/16 no mention on Lisinopril can we refill

## 2016-03-19 NOTE — Telephone Encounter (Signed)
Patient Checking on status of request for refill on lisinopril.   She needs it by Thursday.   [phone] (M)

## 2016-03-28 ENCOUNTER — Ambulatory Visit (INDEPENDENT_AMBULATORY_CARE_PROVIDER_SITE_OTHER): Payer: BLUE CROSS/BLUE SHIELD | Admitting: Physician Assistant

## 2016-03-28 VITALS — BP 150/90 | HR 74 | Temp 98.0°F | Ht 65.25 in | Wt 181.0 lb

## 2016-03-28 DIAGNOSIS — Z1159 Encounter for screening for other viral diseases: Secondary | ICD-10-CM | POA: Diagnosis not present

## 2016-03-28 DIAGNOSIS — I10 Essential (primary) hypertension: Secondary | ICD-10-CM | POA: Diagnosis not present

## 2016-03-28 DIAGNOSIS — L089 Local infection of the skin and subcutaneous tissue, unspecified: Secondary | ICD-10-CM

## 2016-03-28 DIAGNOSIS — L918 Other hypertrophic disorders of the skin: Secondary | ICD-10-CM

## 2016-03-28 DIAGNOSIS — Z6828 Body mass index (BMI) 28.0-28.9, adult: Secondary | ICD-10-CM

## 2016-03-28 DIAGNOSIS — E785 Hyperlipidemia, unspecified: Secondary | ICD-10-CM

## 2016-03-28 LAB — COMPREHENSIVE METABOLIC PANEL
ALBUMIN: 4.3 g/dL (ref 3.6–5.1)
ALT: 36 U/L — ABNORMAL HIGH (ref 6–29)
AST: 30 U/L (ref 10–35)
Alkaline Phosphatase: 104 U/L (ref 33–130)
BILIRUBIN TOTAL: 0.7 mg/dL (ref 0.2–1.2)
BUN: 11 mg/dL (ref 7–25)
CALCIUM: 9.5 mg/dL (ref 8.6–10.4)
CO2: 26 mmol/L (ref 20–31)
Chloride: 104 mmol/L (ref 98–110)
Creat: 0.73 mg/dL (ref 0.50–1.05)
Glucose, Bld: 81 mg/dL (ref 65–99)
Potassium: 4 mmol/L (ref 3.5–5.3)
Sodium: 138 mmol/L (ref 135–146)
Total Protein: 7.2 g/dL (ref 6.1–8.1)

## 2016-03-28 LAB — CBC WITH DIFFERENTIAL/PLATELET
BASOS ABS: 0 {cells}/uL (ref 0–200)
Basophils Relative: 0 %
EOS PCT: 3 %
Eosinophils Absolute: 186 cells/uL (ref 15–500)
HEMATOCRIT: 41.4 % (ref 35.0–45.0)
HEMOGLOBIN: 14.8 g/dL (ref 11.7–15.5)
LYMPHS ABS: 2046 {cells}/uL (ref 850–3900)
LYMPHS PCT: 33 %
MCH: 32.8 pg (ref 27.0–33.0)
MCHC: 35.7 g/dL (ref 32.0–36.0)
MCV: 91.8 fL (ref 80.0–100.0)
MONO ABS: 372 {cells}/uL (ref 200–950)
MPV: 10.5 fL (ref 7.5–12.5)
Monocytes Relative: 6 %
NEUTROS PCT: 58 %
Neutro Abs: 3596 cells/uL (ref 1500–7800)
Platelets: 319 10*3/uL (ref 140–400)
RBC: 4.51 MIL/uL (ref 3.80–5.10)
RDW: 13.7 % (ref 11.0–15.0)
WBC: 6.2 10*3/uL (ref 3.8–10.8)

## 2016-03-28 LAB — TSH: TSH: 1 mIU/L

## 2016-03-28 LAB — LIPID PANEL
CHOLESTEROL: 172 mg/dL (ref 125–200)
HDL: 53 mg/dL (ref >=46)
LDL Cholesterol: 100 mg/dL (ref <130)
TRIGLYCERIDES: 97 mg/dL (ref <150)
Total CHOL/HDL Ratio: 3.2 Ratio (ref <=5.0)
VLDL: 19 mg/dL (ref <30)

## 2016-03-28 MED ORDER — LISINOPRIL-HYDROCHLOROTHIAZIDE 20-25 MG PO TABS: 1.0000 | ORAL_TABLET | Freq: Every day | ORAL | Status: DC

## 2016-03-28 MED ORDER — ATORVASTATIN CALCIUM 20 MG PO TABS: 20.0000 mg | ORAL_TABLET | Freq: Every day | ORAL | Status: DC

## 2016-03-28 NOTE — Progress Notes (Signed)
Patient ID: Hannah Nguyen, female    DOB: [birth year], 55 y.o.   MRN: [id number]  PCP: Wynne Dust  Subjective:   Chief Complaint  Patient presents with  . Medication Refill    lisinopril  . Hypertension    HPI Presents for refill of lisinopril for treatment of HTN.  She tolerates her current treatment well, without adverse effects. Is due for fasting lipid today.  Since I last saw her she has been diagnosed with lumbar spinal stenosis. Dr. Mina Marble offered injections, but she isn't ready yet. 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 When she sits too long she has pain down her leg.  Ran out of lisinopril a couple of days ago.  She is under a lot of stress-relationship with her husband is very strained. He's accusing her of being on drugs.  In addition, there are several small skin tags at the base of the neck that become irritated and painful during the heat of the summer months. Last summer, one became infected after it caught on her clothing, and she desires removal of them today.    Review of Systems As above.    Patient Active Problem List   Diagnosis Date Noted  . Spinal stenosis of lumbar region 03/07/2015  . BMI 28.0-28.9,adult 03/07/2015  . HTN (hypertension) 01/31/2014  . Hyperlipidemia 01/31/2014  . Migraine   . Partial seizure ([place]) 08/17/2012     Prior to Admission medications   Medication Sig Start Date End Date Taking? Authorizing Provider  atorvastatin (LIPITOR) 20 MG tablet Take 1 tablet (20 mg total) by mouth daily after supper. 02/12/16  Yes Dorian Heckle English, PA  Cetirizine HCl (ZYRTEC ALLERGY PO) Take by mouth.   Yes Historical Provider, MD  levETIRAcetam (KEPPRA) 500 MG tablet 250mg  in am, 500mg  in  pm. 12/05/15  Yes Dennie Bible, NP  lisinopril-hydrochlorothiazide (PRINZIDE,ZESTORETIC) 20-25 MG tablet TAKE ONE TABLET BY MOUTH ONCE DAILY 12/10/15  Yes Jennette Leask, PA-C  Multiple  Vitamins-Minerals (MULTIVITAMINS THER. W/MINERALS) TABS Take 1 tablet by mouth daily.     Yes Historical Provider, MD     Allergies  Allergen Reactions  . Vicodin [Hydrocodone-Acetaminophen] Hives and Itching       Objective:  Physical Exam  Constitutional: She is oriented to person, place, and time. She appears well-developed and well-nourished. She is active and cooperative. No distress.  BP 150/90 mmHg  Pulse 74  Temp(Src) 98 F (36.7 C) (Oral)  Ht 5' 5.25" (1.657 m)  Wt 181 lb (82.101 kg)  BMI 29.90 kg/m2  SpO2 97%  LMP 09/19/2011  HENT:  Head: Normocephalic and atraumatic.  Right Ear: Hearing normal.  Left Ear: Hearing normal.  Eyes: Conjunctivae are normal. No scleral icterus.  Neck: Normal range of motion. Neck supple. No thyromegaly present.  Cardiovascular: Normal rate, regular rhythm and normal heart sounds.   Pulses:      Radial pulses are 2+ on the right side, and 2+ on the left side.  Pulmonary/Chest: Effort normal and breath sounds normal.  Lymphadenopathy:       Head (right side): No tonsillar, no preauricular, no posterior auricular and no occipital adenopathy present.       Head (left side): No tonsillar, no preauricular, no posterior auricular and no occipital adenopathy present.    She has no cervical adenopathy.       Right: No supraclavicular adenopathy present.       Left: No supraclavicular  adenopathy present.  Neurological: She is alert and oriented to person, place, and time. No sensory deficit.  Skin: Skin is warm, dry and intact. Lesion (scattered tiny and small skin tags about the base of the neck. THe cluster on the LEFT anterior are the most bothersome) noted. No rash noted. No cyanosis or erythema. Nails show no clubbing.  With permission, 4 of the small skin tags were removed with sharps. Alcohol prep pad used to cleansed the skin. Local anesthesia with a total of 1 cc 2% lidocaine with epi. Pick ups and iris scissors use to lift and excise the  skin tags. Bandaids applied. She tolerated the procedure well without difficulty.  Psychiatric: She has a normal mood and affect. Her speech is normal and behavior is normal.           Assessment & Plan:   1. Essential hypertension Above goal. Ran out of medication. Resume lisinoprilHCTZ. - CBC with Differential/Platelet - Comprehensive metabolic panel - TSH - lisinopril-hydrochlorothiazide (PRINZIDE,ZESTORETIC) 20-25 MG tablet; Take 1 tablet by mouth daily.  Dispense: 90 tablet; Refill: 3  2. Hyperlipidemia Await lab results. Adjust statin dose if indicated. - Lipid panel - atorvastatin (LIPITOR) 20 MG tablet; Take 1 tablet (20 mg total) by mouth daily after supper.  Dispense: 90 tablet; Refill: 3  3. BMI 28.0-28.9,adult Encouraged exercise, healthy eating choices.  4. Need for hepatitis B screening test 5. Need for hepatitis C screening test She desires screening. - Acute Hep Panel & Hep B Surface Ab  6. Inflamed skin tag Removed without incident.   Fara Chute, PA-C Physician Assistant-Certified [facility]

## 2016-03-28 NOTE — Patient Instructions (Addendum)
     IF you received an x-ray today, you will receive an invoice from [facility]. Please contact [facility] at [phone] with questions or concerns regarding your invoice.   IF you received labwork today, you will receive an invoice from [employer]. Please contact [facility] at [phone] with questions or concerns regarding your invoice.   Our billing staff will not be able to assist you with questions regarding bills from these companies.  You will be contacted with the lab results as soon as they are available. The fastest way to get your results is to activate your My Chart account. Instructions are located on the last page of this paperwork. If you have not heard from [place] regarding the results in 2 weeks, please contact this office.     WOUND CARE . Keep area clean and dry for 24 hours. Do not remove bandage, if applied. . After 24 hours, remove bandage and wash wound gently with mild soap and warm water. Reapply a new bandage after cleaning wound, if directed. . Continue daily cleansing with soap and water until stitches/staples are removed. . Notify the office if you experience any of the following signs of infection: Swelling, redness, pus drainage, streaking, fever >101.0 F . Notify the office if you experience excessive bleeding that does not stop after 15-20 minutes of constant, firm pressure.

## 2016-03-29 LAB — ACUTE HEP PANEL AND HEP B SURFACE AB
HCV Ab: NEGATIVE
HEP A IGM: NONREACTIVE
HEP B S AB: NEGATIVE
Hep B C IgM: NONREACTIVE
Hepatitis B Surface Ag: NEGATIVE

## 2016-06-20 ENCOUNTER — Other Ambulatory Visit: Payer: Self-pay | Admitting: Nurse Practitioner

## 2016-06-20 NOTE — Telephone Encounter (Signed)
Called and spoke to pt pharamcy. Advised CM, NP already sent med refill back on 12/05/15 with 11 refills. She checked and they do have prescription on file. She will go ahead and refill pt med. Pt requested med using old rx.

## 2016-07-23 DIAGNOSIS — H25813 Combined forms of age-related cataract, bilateral: Secondary | ICD-10-CM | POA: Diagnosis not present

## 2016-08-02 LAB — GLUCOSE, POCT (MANUAL RESULT ENTRY): POC Glucose: 112 mg/dl — AB (ref 70–99)

## 2016-08-15 ENCOUNTER — Other Ambulatory Visit: Payer: Self-pay | Admitting: Physician Assistant

## 2016-08-15 DIAGNOSIS — Z1231 Encounter for screening mammogram for malignant neoplasm of breast: Secondary | ICD-10-CM

## 2016-08-29 DIAGNOSIS — N200 Calculus of kidney: Secondary | ICD-10-CM | POA: Diagnosis not present

## 2016-09-10 ENCOUNTER — Ambulatory Visit: Payer: Federal, State, Local not specified - PPO

## 2016-09-22 IMAGING — CT CT RENAL STONE PROTOCOL
2 of 4 series · 12 of 46 positions shown, 14 images · non-contrast
Comparison: Abdominal radiograph performed 01/31/2014, and CT of
the abdomen and pelvis from 05/06/2011

CLINICAL DATA: Acute onset of dizziness and blurred vision. Nausea.
Sensation of object in stomach. Initial encounter.

EXAM:
CT ABDOMEN AND PELVIS WITHOUT CONTRAST
TECHNIQUE: Multidetector CT imaging of the abdomen and pelvis was performed
following the standard protocol without IV contrast.

[Series 201: stone study, idose (2) · axial · 0.74mm/px · z∈[+12,+367]mm · 9 of 87 slices shown, 11 images]
[im 8/87  soft-tissue]
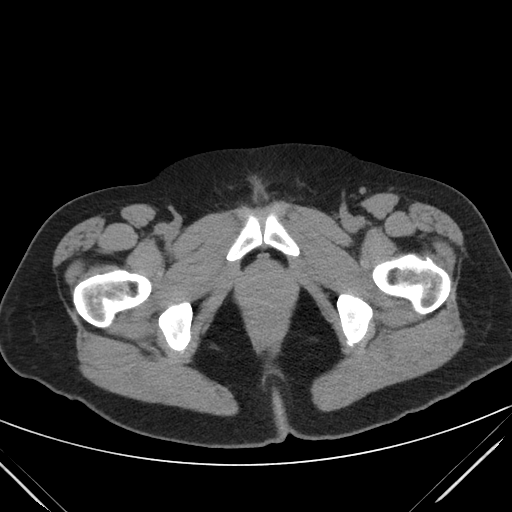
[im 8/87  bone]
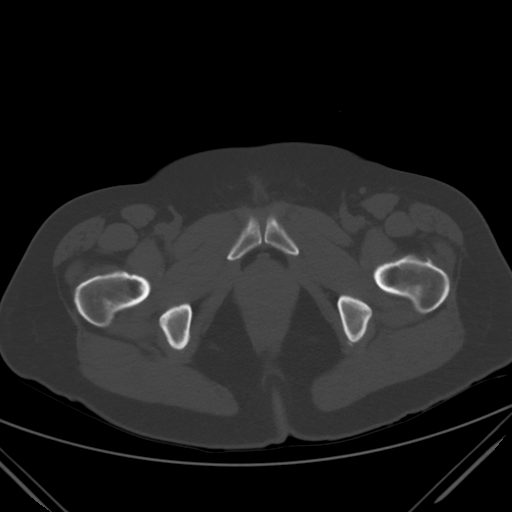
[im 15/87  soft-tissue]
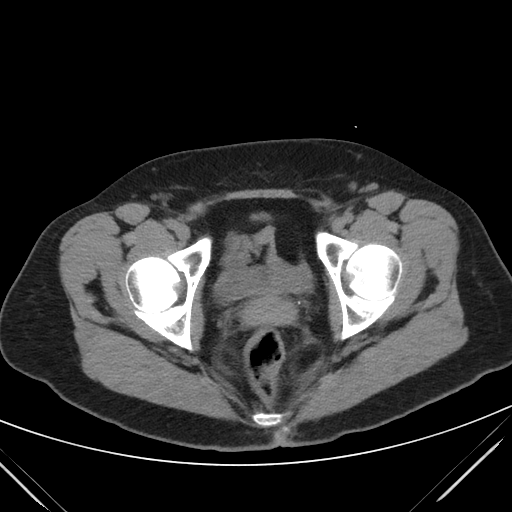
[im 26/87  soft-tissue]
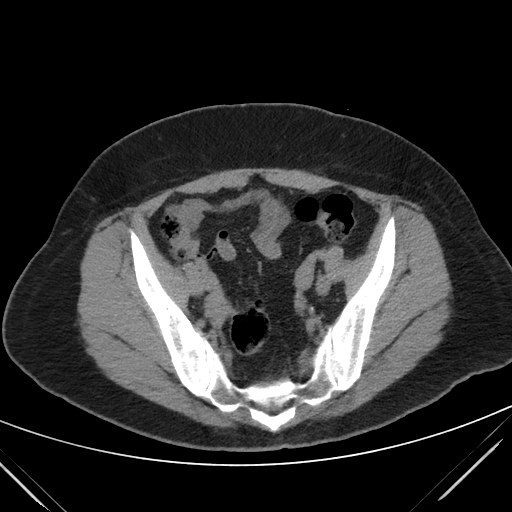
[im 33/87  soft-tissue]
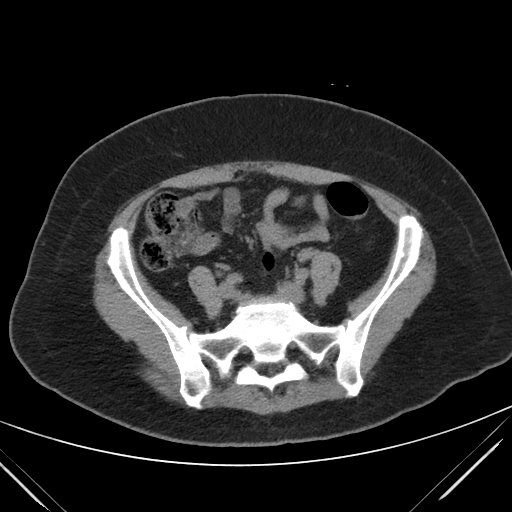
[im 44/87  soft-tissue]
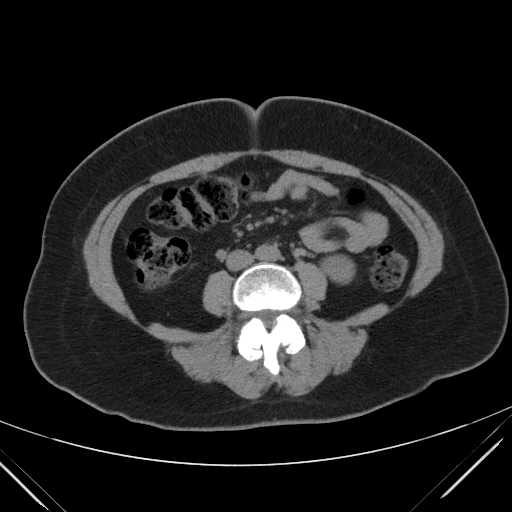
[im 54/87  soft-tissue]
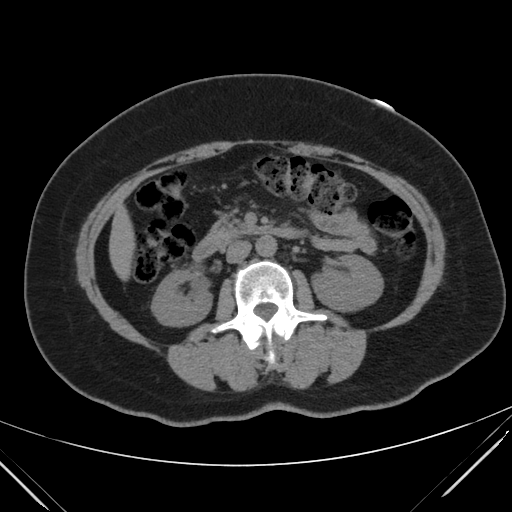
[im 61/87  soft-tissue]
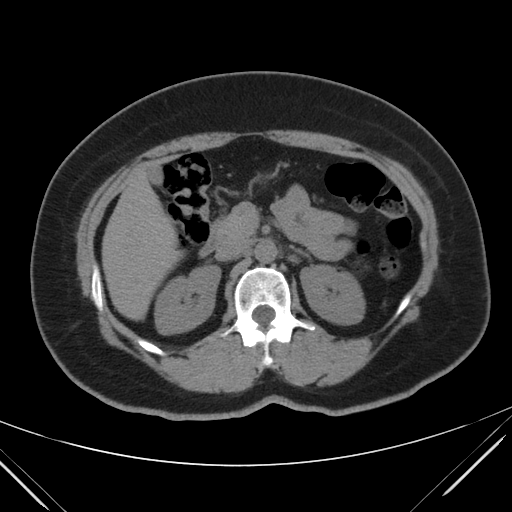
[im 72/87  soft-tissue]
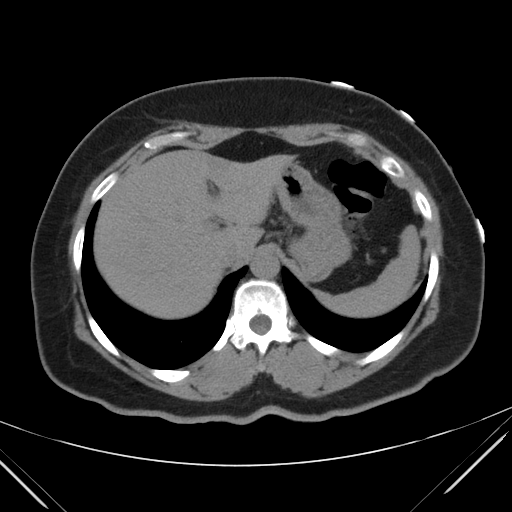
[im 79/87  soft-tissue]
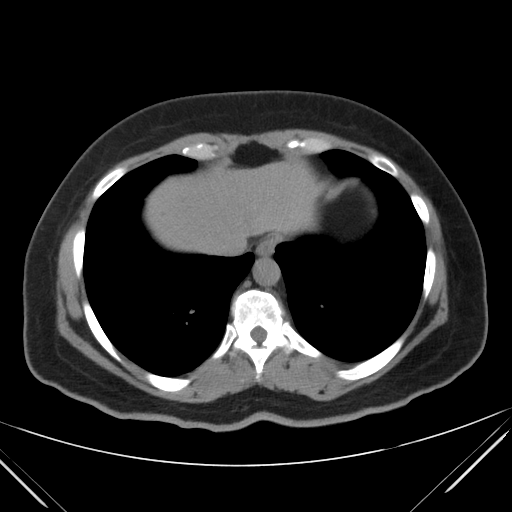
[im 79/87  bone]
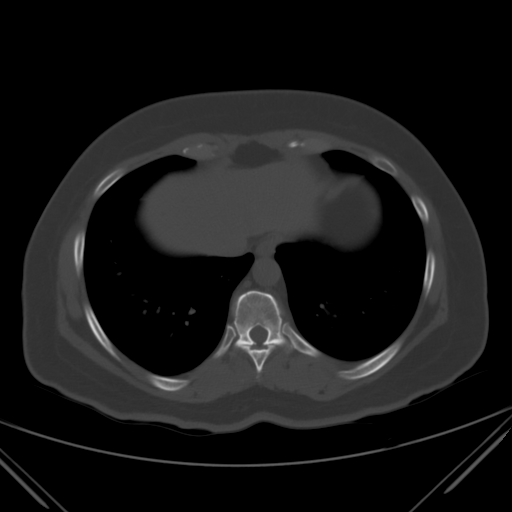

[Series 203: coronals, idose (2) · coronal · 0.45mm/px · 3 of 102 slices shown]
[im 34/102  soft-tissue]
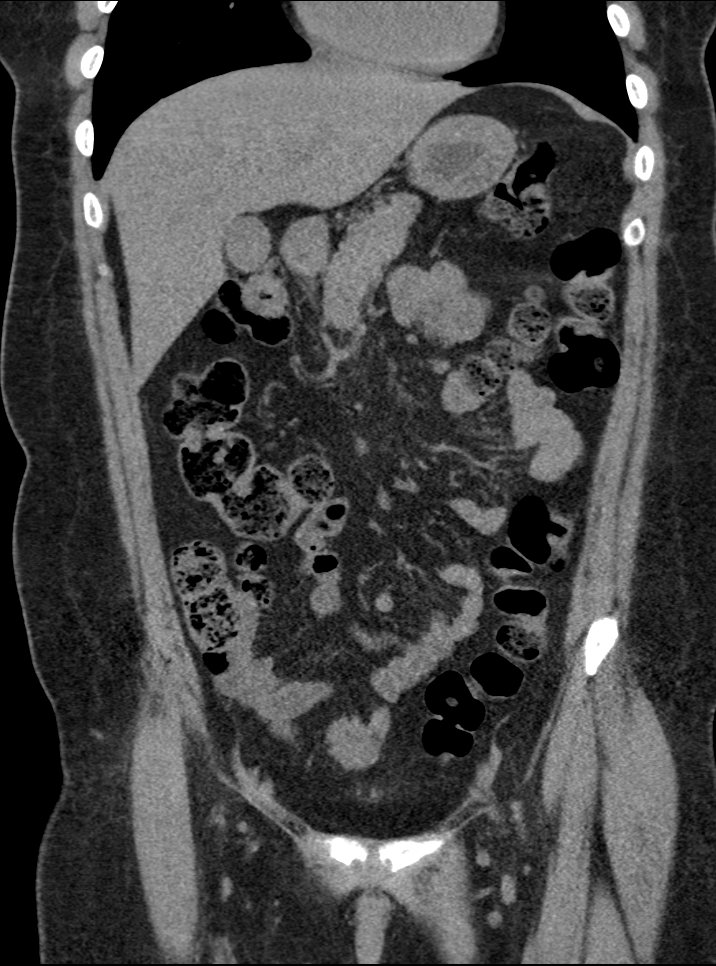
[im 45/102  soft-tissue]
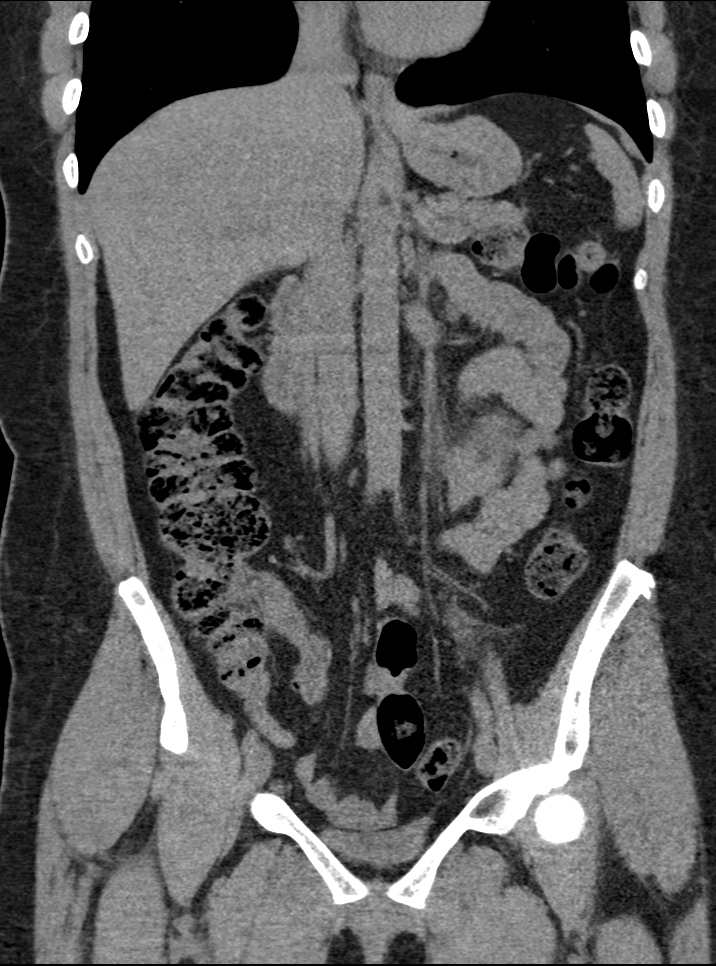
[im 57/102  soft-tissue]
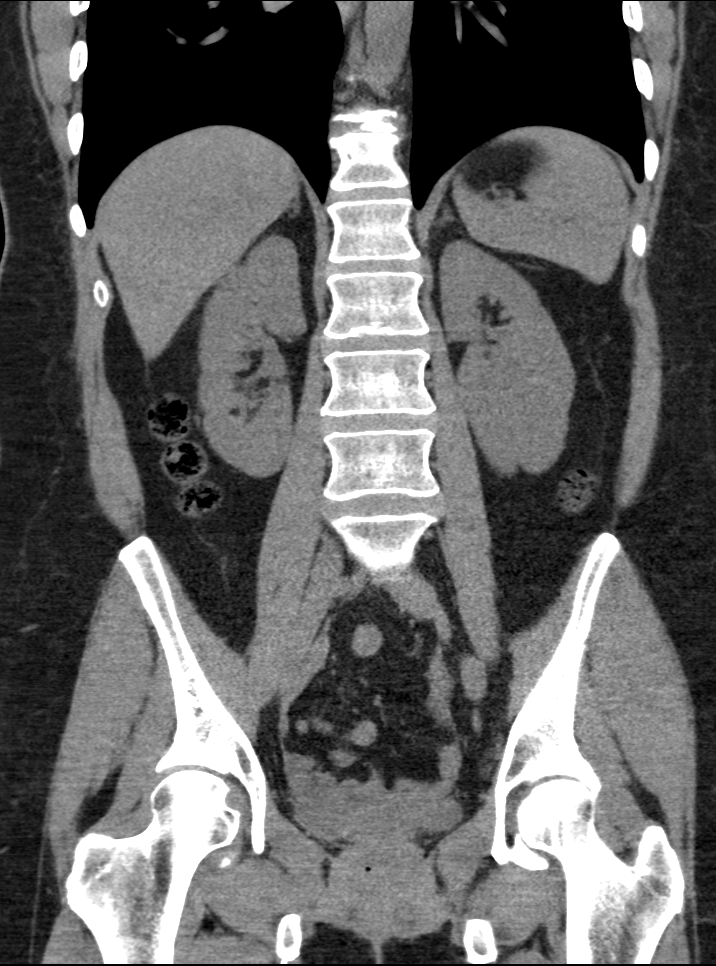

[12 of 46 positions shown; findings below may reference images not displayed]

FINDINGS: The visualized lung bases are clear.

The liver and spleen are unremarkable in appearance. The gallbladder
is within normal limits. The pancreas and adrenal glands are
unremarkable.

A 7 mm stone is noted at the interpole region of the right kidney.
Parenchymal calcification at the lower pole of right kidney likely
reflects prior injury. The left kidney is unremarkable in
appearance. There is no evidence of hydronephrosis. No obstructing
ureteral stones are identified.

No free fluid is identified. The small bowel is unremarkable in
appearance. The stomach is within normal limits. No acute vascular
abnormalities are seen.

The appendix is normal in caliber and contains air, without evidence
of appendicitis. The colon is unremarkable in appearance.

The bladder is decompressed and not well assessed. The ovaries are
grossly symmetric. No suspicious adnexal masses are seen. The
patient is status post hysterectomy. No inguinal lymphadenopathy is
seen.

No acute osseous abnormalities are identified. There is mild grade 1
anterolisthesis of L4 on L5, reflecting underlying facet disease.
IMPRESSION: 1. No acute abnormality seen to explain the patient's symptoms.
2. 7 mm nonobstructing stone at the interpole region of the right
kidney.
3. Mild grade 1 anterolisthesis of L4 on L5, reflecting underlying
facet disease.

## 2016-10-01 ENCOUNTER — Ambulatory Visit
Admission: RE | Admit: 2016-10-01 | Discharge: 2016-10-01 | Disposition: A | Discharge status: Home / Self Care | Payer: BLUE CROSS/BLUE SHIELD | Source: Ambulatory Visit | Attending: Physician Assistant | Admitting: Physician Assistant

## 2016-10-01 DIAGNOSIS — Z1231 Encounter for screening mammogram for malignant neoplasm of breast: Secondary | ICD-10-CM

## 2016-10-29 ENCOUNTER — Ambulatory Visit (INDEPENDENT_AMBULATORY_CARE_PROVIDER_SITE_OTHER): Payer: BLUE CROSS/BLUE SHIELD | Admitting: Physician Assistant

## 2016-10-29 ENCOUNTER — Encounter: Payer: Self-pay | Admitting: Physician Assistant

## 2016-10-29 VITALS — BP 136/80 | HR 80 | Temp 98.6°F | Resp 16 | Ht 65.0 in | Wt 178.0 lb

## 2016-10-29 DIAGNOSIS — Z23 Encounter for immunization: Secondary | ICD-10-CM

## 2016-10-29 DIAGNOSIS — E785 Hyperlipidemia, unspecified: Secondary | ICD-10-CM

## 2016-10-29 DIAGNOSIS — Z6828 Body mass index (BMI) 28.0-28.9, adult: Secondary | ICD-10-CM | POA: Diagnosis not present

## 2016-10-29 DIAGNOSIS — Z0000 Encounter for general adult medical examination without abnormal findings: Secondary | ICD-10-CM

## 2016-10-29 DIAGNOSIS — M48061 Spinal stenosis, lumbar region without neurogenic claudication: Secondary | ICD-10-CM

## 2016-10-29 DIAGNOSIS — J3489 Other specified disorders of nose and nasal sinuses: Secondary | ICD-10-CM

## 2016-10-29 DIAGNOSIS — N200 Calculus of kidney: Secondary | ICD-10-CM

## 2016-10-29 DIAGNOSIS — R739 Hyperglycemia, unspecified: Secondary | ICD-10-CM | POA: Diagnosis not present

## 2016-10-29 DIAGNOSIS — I10 Essential (primary) hypertension: Secondary | ICD-10-CM

## 2016-10-29 MED ORDER — CELECOXIB 100 MG PO CAPS: 100.0000 mg | ORAL_CAPSULE | Freq: Two times a day (BID) | ORAL | 3 refills | Status: DC

## 2016-10-29 MED ORDER — LISINOPRIL-HYDROCHLOROTHIAZIDE 20-25 MG PO TABS: 1.0000 | ORAL_TABLET | Freq: Every day | ORAL | 3 refills | Status: DC

## 2016-10-29 MED ORDER — BENZONATATE 100 MG PO CAPS: 100.0000 mg | ORAL_CAPSULE | Freq: Three times a day (TID) | ORAL | 0 refills | Status: DC | PRN

## 2016-10-29 MED ORDER — ATORVASTATIN CALCIUM 20 MG PO TABS: 20.0000 mg | ORAL_TABLET | Freq: Every day | ORAL | 3 refills | Status: DC

## 2016-10-29 NOTE — Patient Instructions (Addendum)
Go ahead with the paperwork toward your divorce. Follow-up with Dr. Alyson Ingles and Ms. Hassell Done. Restart the Flonase to see if that helps reduce the sinus pressure and your morning headaches. Try the celecoxib (Celebrex) for the back and leg pain.    IF you received an x-ray today, you will receive an invoice from [facility]. Please contact [facility] at [phone] with questions or concerns regarding your invoice.   IF you received labwork today, you will receive an invoice from [employer]. Please contact [facility] at [phone] with questions or concerns regarding your invoice.   Our billing staff will not be able to assist you with questions regarding bills from these companies.  You will be contacted with the lab results as soon as they are available. The fastest way to get your results is to activate your My Chart account. Instructions are located on the last page of this paperwork. If you have not heard from [place] regarding the results in 2 weeks, please contact this office.    Keeping You Healthy  Get These Tests  Blood Pressure- Have your blood pressure checked by your healthcare provider at least once a year.  Normal blood pressure is 120/80.  Weight- Have your body mass index (BMI) calculated to screen for obesity.  BMI is a measure of body fat based on height and weight.  You can calculate your own BMI at [employer]velB[employer].it  Cholesterol- Have your cholesterol checked every year.  Diabetes- Have your blood sugar checked every year if you have high blood pressure, high cholesterol, a family history of diabetes or if you are overweight.  Pap Test - Have a pap test every 1 to 5 years if you have been sexually active.  If you are older than 65 and recent pap tests have been normal you may not need additional pap tests.  In addition, if you have had a hysterectomy  for benign disease additional pap tests are not  necessary.  Mammogram-Yearly mammograms are essential for early detection of breast cancer  Screening for Colon Cancer- Colonoscopy starting at age 54. Screening may begin sooner depending on your family history and other health conditions.  Follow up colonoscopy as directed by your Gastroenterologist.  Screening for Osteoporosis- Screening begins at age 9 with bone density scanning, sooner if you are at higher risk for developing Osteoporosis.  Get these medicines  Calcium with Vitamin D- Your body requires 1200-1500 mg of Calcium a day and [phone] IU of Vitamin D a day.  You can only absorb 500 mg of Calcium at a time therefore Calcium must be taken in 2 or 3 separate doses throughout the day.  Hormones- Hormone therapy has been associated with increased risk for certain cancers and heart disease.  Talk to your healthcare provider about if you need relief from menopausal symptoms.  Aspirin- Ask your healthcare provider about taking Aspirin to prevent Heart Disease and Stroke.  Get these Immuniztions  Flu shot- Every fall  Pneumonia shot- Once after the age of 28; if you are younger ask your healthcare provider if you need a pneumonia shot.  Tetanus- Every ten years.  Zostavax- Once after the age of 72 to prevent shingles.  Take these steps  Don't smoke- Your healthcare provider can help you quit. For tips on how to quit, ask your healthcare provider or go to www.smokefree.gov or call 1-800 QUIT-NOW.  Be physically active- Exercise 5 days a week for a minimum of 30 minutes.  If you are  not already physically active, start slow and gradually work up to 30 minutes of moderate physical activity.  Try walking, dancing, bike riding, swimming, etc.  Eat a healthy diet- Eat a variety of healthy foods such as fruits, vegetables, whole grains, low fat milk, low fat cheeses, yogurt, lean meats, chicken, fish, eggs, dried beans, tofu, etc.  For more information go to  www.thenutritionsource.org  Dental visit- Brush and floss teeth twice daily; visit your dentist twice a year.  Eye exam- Visit your Optometrist or Ophthalmologist yearly.  Drink alcohol in moderation- Limit alcohol intake to one drink or less a day.  Never drink and drive.  Depression- Your emotional health is as important as your physical health.  If you're feeling down or losing interest in things you normally enjoy, please talk to your healthcare provider.  Seat Belts- can save your life; always wear one  Smoke/Carbon Monoxide detectors- These detectors need to be installed on the appropriate level of your home.  Replace batteries at least once a year.  Violence- If anyone is threatening or hurting you, please tell your healthcare provider.  Living Will/ Health care power of attorney- Discuss with your healthcare provider and family.

## 2016-10-29 NOTE — Progress Notes (Signed)
Patient ID: Hannah Nguyen, female    DOB: 1961/03/26, 55 y.o.   MRN: [id number]  PCP: Harrison Mons, PA-C  Chief Complaint  Patient presents with  . Annual Exam    Subjective:   Presents for [employer].   Cervical Cancer Screening: no longer indicated, s/p hysterectomy for fibroids Breast Cancer Screening: 10/02/2016, normal Colorectal Cancer Screening: 09/15/2013 Bone Density Testing: not yet HIV Screening: 02/2015 NEGATIVE STI Screening: very low risk. Not sexually active Seasonal Influenza Vaccination: 07/2016 Td/Tdap Vaccination: 01/21/2011 Pneumococcal Vaccination: not yet a candidate Zoster Vaccination: not yet  Frequency of Dental evaluation: looking for dental insurance, last dental visit 6 months ago Frequency of Eye evaluation: annually  NOT immune to Hepatitis B by titer Lipids 03/2016 were controlled, TSH normal, CMET normal Non-fasting glucose 08/04/2016 at neurology was 112  Since I last saw her, she and her husband have separated. He made accusations of drug use, and communicated that the only reason he married her was for the tax deduction. He changed the locks and then called the landlord reporting that she was refusing to leave. They separated before, then he asked her back, and papers were never signed. She has not initiated any legal proceedings this time. She misses her 69 year old step son. Living with her sister, and her sister's grandchildren, looking for her own place.  Would like a prescription for tessalon perles to help with the cough she anticipates this winter, they help her with the annual sinus drainage she experiences.  Asks for an alternative to naproxen.  It doesn't "agree with" her, she "doesn't like it," but can't tell me why.  Took meloxicam in 2012 and didn't like it, but doesn't recall why. Her back pain (lumbar spinal stenosis) radiates to the LEFT leg. As long as she stays active, it's not so bothersome, but getting  comfortable to sleep is problematic.   Patient Active Problem List   Diagnosis Date Noted  . Spinal stenosis of lumbar region 03/07/2015  . BMI 28.0-28.9,adult 03/07/2015  . HTN (hypertension) 01/31/2014  . Hyperlipidemia 01/31/2014  . Migraine   . Partial seizure ([place]) 08/17/2012    Past Medical History:  Diagnosis Date  . Bartholin's gland cyst 10/22/2011  . Elevated cholesterol   . GERD (gastroesophageal reflux disease)   . Hypertension   . Kidney infection 05/2015  . Leiomyoma of uterus 10/22/2011  . Migraine   . Nephrolithiasis   . Other and unspecified hyperlipidemia   . Seizures ([place])    2 months ago was last episode of uncontrollable rt arm motions and passed out  . Spinal stenosis 11/2014   L4-L5, L leg pain     Prior to Admission medications   Medication Sig Start Date End Date Taking? Authorizing Provider  atorvastatin (LIPITOR) 20 MG tablet Take 1 tablet (20 mg total) by mouth daily after supper. 03/28/16  Yes Lupita Rosales, PA-C  Cetirizine HCl (ZYRTEC ALLERGY PO) Take by mouth.   Yes Historical Provider, MD  levETIRAcetam (KEPPRA) 500 MG tablet 250mg  in am, 500mg  in  pm. 12/05/15  Yes Dennie Bible, NP  lisinopril-hydrochlorothiazide (PRINZIDE,ZESTORETIC) 20-25 MG tablet Take 1 tablet by mouth daily. 03/28/16  Yes Kyheem Bathgate, PA-C  Multiple Vitamins-Minerals (MULTIVITAMINS THER. W/MINERALS) TABS Take 1 tablet by mouth daily.     Yes Historical Provider, MD    Allergies  Allergen Reactions  . Vicodin [Hydrocodone-Acetaminophen] Hives and Itching    Past Surgical History:  Procedure Laterality Date  . ABDOMINAL HYSTERECTOMY    .  CESAREAN SECTION     x2  . KNEE ARTHROSCOPY     x2  . LITHOTRIPSY    . ROBOTIC ASSISTED LAP VAGINAL HYSTERECTOMY  10/25/2011   Procedure: ROBOTIC ASSISTED LAPAROSCOPIC VAGINAL HYSTERECTOMY;  Surgeon: Agnes Lawrence, MD;  Location: [facility];  Service: Gynecology;  Laterality: N/A;  Marsupralization og bartholin gland,  cyst biopsy  . thumb surg      Family History  Problem Relation Age of Onset  . Diabetes Mother   . Hyperlipidemia Mother   . Hypertension Mother   . Breast cancer Mother 57    s/p bilateral mastectomy  . Diabetes Father   . Hyperlipidemia Father   . Hypertension Father   . Hypertension Sister   . Bipolar disorder Daughter   . Asthma Daughter   . Diabetes Daughter   . Bipolar disorder Son     Social History   Social History  . Marital status: Married    Spouse name: Owens Shark, estranged 07/2016  . Number of children: 2  . Years of education: 13   Occupational History  . [employer]    full time  . office cleaning     part time at night   Social History Main Topics  . Smoking status: Never Smoker  . Smokeless tobacco: Never Used  . Alcohol use No  . Drug use: No  . Sexual activity: Yes    Birth control/ protection: Surgical   Other Topics Concern  . None   Social History Narrative   Patient lives with her sister since separating from her husband 07/2016.   Patient works full time.   Right handed.    Caffeine- three sodas daily.      Smoke detectors in home? Yes    Guns in home? No    Consistent seatbelt use? Yes    Consistent dental brushing and flossing? Yes    Semi-Annual dental visits: Yes    Annual Eye exams: Yes           Review of Systems  Constitutional: Negative.   HENT: Positive for dental problem (has been seeing a dentist, but doesn't currently have insurance). Negative for congestion, drooling, ear discharge, ear pain, facial swelling, hearing loss, mouth sores, nosebleeds, postnasal drip, rhinorrhea, sinus pain, sinus pressure, sneezing, sore throat, tinnitus, trouble swallowing and voice change.   Eyes: Positive for visual disturbance (sees eye specialist). Negative for photophobia, pain, discharge, redness and itching.  Respiratory: Negative.   Cardiovascular: Negative.   Gastrointestinal: Negative.   Endocrine: Negative.     Genitourinary: Negative.   Musculoskeletal: Negative.   Skin: Negative.   Allergic/Immunologic: Negative.   Neurological: Negative.   Hematological: Negative.   Psychiatric/Behavioral: Negative.         Objective:  Physical Exam  Constitutional: She is oriented to person, place, and time. Vital signs are normal. She appears well-developed and well-nourished. She is active and cooperative. No distress.  BP (!) 144/88 (BP Location: Left Arm, Patient Position: Sitting, Cuff Size: Normal)   Pulse 80   Temp 98.6 F (37 C)   Resp 16   Ht 5\' 5"  (1.651 m)   Wt 178 lb (80.7 kg)   LMP 09/22/2011   SpO2 98%   BMI 29.62 kg/m    HENT:  Head: Normocephalic and atraumatic.  Right Ear: Hearing, tympanic membrane, external ear and ear canal normal. No foreign bodies.  Left Ear: Hearing, tympanic membrane, external ear and ear canal normal. No foreign bodies.  Nose: Nose  normal.  Mouth/Throat: Uvula is midline, oropharynx is clear and moist and mucous membranes are normal. No oral lesions. Normal dentition. No dental abscesses or uvula swelling. No oropharyngeal exudate.  Eyes: Conjunctivae, EOM and lids are normal. Pupils are equal, round, and reactive to light. Right eye exhibits no discharge. Left eye exhibits no discharge. No scleral icterus.  Fundoscopic exam:      The right eye shows no arteriolar narrowing, no AV nicking, no exudate, no hemorrhage and no papilledema. The right eye shows red reflex.       The left eye shows no arteriolar narrowing, no AV nicking, no exudate, no hemorrhage and no papilledema. The left eye shows red reflex.  Neck: Trachea normal, normal range of motion and full passive range of motion without pain. Neck supple. No spinous process tenderness and no muscular tenderness present. No thyroid mass and no thyromegaly present.  Cardiovascular: Normal rate, regular rhythm, normal heart sounds, intact distal pulses and normal pulses.   Pulmonary/Chest: Effort normal  and breath sounds normal.  Musculoskeletal: She exhibits no edema or tenderness.       Cervical back: Normal.       Thoracic back: Normal.       Lumbar back: Normal.  Lymphadenopathy:       Head (right side): No tonsillar, no preauricular, no posterior auricular and no occipital adenopathy present.       Head (left side): No tonsillar, no preauricular, no posterior auricular and no occipital adenopathy present.    She has no cervical adenopathy.       Right: No supraclavicular adenopathy present.       Left: No supraclavicular adenopathy present.  Neurological: She is alert and oriented to person, place, and time. She has normal strength and normal reflexes. No cranial nerve deficit. She exhibits normal muscle tone. Coordination and gait normal.  Skin: Skin is warm, dry and intact. No rash noted. She is not diaphoretic. No cyanosis or erythema. Nails show no clubbing.  Psychiatric: She has a normal mood and affect. Her speech is normal and behavior is normal. Judgment and thought content normal.           Assessment & Plan:  1. Annual physical exam Age appropriate anticipatory guidance provided.  2. Need for hepatitis B vaccination Start series today. - Hepatitis B vaccine adult IM - Hepatitis B vaccine adult IM; Future - Hepatitis B vaccine adult IM; Future  3. Essential hypertension Controlled on re-check. No adjustments today. - CBC with Differential/Platelet - Comprehensive metabolic panel - TSH - lisinopril-hydrochlorothiazide (PRINZIDE,ZESTORETIC) 20-25 MG tablet; Take 1 tablet by mouth daily.  Dispense: 90 tablet; Refill: 3  4. Hyperlipidemia, unspecified hyperlipidemia type Await labs. Adjust regimen as indicated by results. - Comprehensive metabolic panel - Lipid panel - atorvastatin (LIPITOR) 20 MG tablet; Take 1 tablet (20 mg total) by mouth daily after supper.  Dispense: 90 tablet; Refill: 3  5. BMI 28.0-28.9,adult Healthy eating and regular exercise  encouraged.  6. Hyperglycemia Await lab results. - Comprehensive metabolic panel - Hemoglobin A1c  7. Spinal stenosis of lumbar region, unspecified whether neurogenic claudication present Considering injections. Trial of celecoxib. - celecoxib (CELEBREX) 100 MG capsule; Take 1 capsule (100 mg total) by mouth 2 (two) times daily.  Dispense: 60 capsule; Refill: 3  8. Sinus pressure Resume Flonase, which she has at home. PRN tessalon if drainage causes cough. - benzonatate (TESSALON) 100 MG capsule; Take 1-2 capsules (100-200 mg total) by mouth 3 (three) times daily as  needed for cough.  Dispense: 40 capsule; Refill: 0  9. Nephrolithiasis Continue follow-up with Dr. Alyson Ingles per his recommendations.     Fara Chute, PA-C Physician Assistant-Certified [facility]

## 2016-10-30 LAB — COMPREHENSIVE METABOLIC PANEL
A/G RATIO: 1.5 (ref 1.2–2.2)
ALBUMIN: 4.4 g/dL (ref 3.5–5.5)
ALT: 24 IU/L (ref 0–32)
AST: 26 IU/L (ref 0–40)
Alkaline Phosphatase: 111 IU/L (ref 39–117)
BILIRUBIN TOTAL: 0.4 mg/dL (ref 0.0–1.2)
BUN / CREAT RATIO: 24 — AB (ref 9–23)
BUN: 17 mg/dL (ref 6–24)
CHLORIDE: 100 mmol/L (ref 96–106)
CO2: 25 mmol/L (ref 18–29)
Calcium: 9.7 mg/dL (ref 8.7–10.2)
Creatinine, Ser: 0.72 mg/dL (ref 0.57–1.00)
GFR calc non Af Amer: 95 mL/min/{1.73_m2} (ref >59)
GFR, EST AFRICAN AMERICAN: 110 mL/min/{1.73_m2} (ref >59)
Globulin, Total: 2.9 g/dL (ref 1.5–4.5)
Glucose: 81 mg/dL (ref 65–99)
POTASSIUM: 4.1 mmol/L (ref 3.5–5.2)
Sodium: 141 mmol/L (ref 134–144)
TOTAL PROTEIN: 7.3 g/dL (ref 6.0–8.5)

## 2016-10-30 LAB — LIPID PANEL
Chol/HDL Ratio: 4.9 ratio units — ABNORMAL HIGH (ref 0.0–4.4)
Cholesterol, Total: 257 mg/dL — ABNORMAL HIGH (ref 100–199)
HDL: 52 mg/dL (ref >39)
LDL Calculated: 185 mg/dL — ABNORMAL HIGH (ref 0–99)
Triglycerides: 100 mg/dL (ref 0–149)
VLDL Cholesterol Cal: 20 mg/dL (ref 5–40)

## 2016-10-30 LAB — CBC WITH DIFFERENTIAL/PLATELET
BASOS: 0 %
Basophils Absolute: 0 10*3/uL (ref 0.0–0.2)
EOS (ABSOLUTE): 0.1 10*3/uL (ref 0.0–0.4)
Eos: 2 %
HEMOGLOBIN: 13.8 g/dL (ref 11.1–15.9)
Hematocrit: 41.1 % (ref 34.0–46.6)
IMMATURE GRANS (ABS): 0 10*3/uL (ref 0.0–0.1)
Immature Granulocytes: 0 %
LYMPHS ABS: 2.3 10*3/uL (ref 0.7–3.1)
LYMPHS: 34 %
MCH: 31.1 pg (ref 26.6–33.0)
MCHC: 33.6 g/dL (ref 31.5–35.7)
MCV: 93 fL (ref 79–97)
Monocytes Absolute: 0.4 10*3/uL (ref 0.1–0.9)
Monocytes: 6 %
NEUTROS ABS: 3.9 10*3/uL (ref 1.4–7.0)
Neutrophils: 58 %
PLATELETS: 344 10*3/uL (ref 150–379)
RBC: 4.44 x10E6/uL (ref 3.77–5.28)
RDW: 13.8 % (ref 12.3–15.4)
WBC: 6.8 10*3/uL (ref 3.4–10.8)

## 2016-10-30 LAB — TSH: TSH: 0.785 u[IU]/mL (ref 0.450–4.500)

## 2016-10-30 LAB — HEMOGLOBIN A1C
ESTIMATED AVERAGE GLUCOSE: 108 mg/dL
HEMOGLOBIN A1C: 5.4 % (ref 4.8–5.6)

## 2016-11-07 ENCOUNTER — Ambulatory Visit (INDEPENDENT_AMBULATORY_CARE_PROVIDER_SITE_OTHER): Payer: BLUE CROSS/BLUE SHIELD | Admitting: Physician Assistant

## 2016-11-07 VITALS — BP 122/70 | HR 80 | Temp 98.3°F | Resp 16 | Ht 65.0 in | Wt 177.0 lb

## 2016-11-07 DIAGNOSIS — R197 Diarrhea, unspecified: Secondary | ICD-10-CM

## 2016-11-07 MED ORDER — DICYCLOMINE HCL 10 MG PO CAPS: 10.0000 mg | ORAL_CAPSULE | Freq: Three times a day (TID) | ORAL | 0 refills | Status: DC

## 2016-11-07 NOTE — Patient Instructions (Addendum)
1. Drink at least 64 ounces of water each day. 2. Use the twice a day Immodium if needed. 3. Returning to your regular eating habits will help provide bulk to your stool. 4. The Bentyl can help with the cramping.    IF you received an x-ray today, you will receive an invoice from [facility]. Please contact [facility] at [phone] with questions or concerns regarding your invoice.   IF you received labwork today, you will receive an invoice from [facility]. Please contact [facility] at [phone] with questions or concerns regarding your invoice.   Our billing staff will not be able to assist you with questions regarding bills from these companies.  You will be contacted with the lab results as soon as they are available. The fastest way to get your results is to activate your My Chart account. Instructions are located on the last page of this paperwork. If you have not heard from [place] regarding the results in 2 weeks, please contact this office.

## 2016-11-07 NOTE — Progress Notes (Signed)
Patient ID: Hannah Nguyen, female    DOB: [birth year], 55 y.o.   MRN: [id number]  PCP: Harrison Mons, PA-C  Chief Complaint  Patient presents with  . Diarrhea    Thinks it could be reaction from hep b     Subjective:   Presents for evaluation of diarrhea x 4 days.  Symptoms began on Monday 10/28/2016 and she relates having to defecate Q5 minutes until she took OTC Immodium, and now it's only a couple of times each day.  She thinks it may be due to the Hep B vaccine she received here on 10/29/16.  No new foods, recent travel. She recalls eating a hot dog at [facility] the previous week after having not eaten there in a while. No nausea/vomiting. Cramping with urge to defecate, resolves temporarily after stool.  Brown colored, light brown, with a "moucous type something in it. It don't look right." Now more watery.  No urinary changes. No change in urine color.   Hasn't started the celecoxib yet. Is not taking the tessalon perles now, doesn't need it.  We reviewed her recent labs, which she did not review on My Chart and asked about. Lipids went up, and she relates that she hasn't been taking the lipitor regualrly and has been eating more carbs.  Review of Systems As above.    Patient Active Problem List   Diagnosis Date Noted  . Nephrolithiasis 10/29/2016  . Spinal stenosis of lumbar region 03/07/2015  . BMI 28.0-28.9,adult 03/07/2015  . HTN (hypertension) 01/31/2014  . Hyperlipidemia 01/31/2014  . Migraine   . Partial seizure ([place]) 08/17/2012     Prior to Admission medications   Medication Sig Start Date End Date Taking? Authorizing Provider  atorvastatin (LIPITOR) 20 MG tablet Take 1 tablet (20 mg total) by mouth daily after supper. 10/29/16  Yes Mayrene Bastarache, PA-C  benzonatate (TESSALON) 100 MG capsule Take 1-2 capsules (100-200 mg total) by mouth 3 (three) times daily as needed for cough. 10/29/16  Yes Lauryl Seyer, PA-C  Cetirizine HCl (ZYRTEC  ALLERGY PO) Take by mouth.   Yes Historical Provider, MD  levETIRAcetam (KEPPRA) 500 MG tablet 250mg  in am, 500mg  in  pm. 12/05/15  Yes Dennie Bible, NP  lisinopril-hydrochlorothiazide (PRINZIDE,ZESTORETIC) 20-25 MG tablet Take 1 tablet by mouth daily. 10/29/16  Yes Jordan Caraveo, PA-C  Multiple Vitamins-Minerals (MULTIVITAMINS THER. W/MINERALS) TABS Take 1 tablet by mouth daily.     Yes Historical Provider, MD  celecoxib (CELEBREX) 100 MG capsule Take 1 capsule (100 mg total) by mouth 2 (two) times daily. Patient not taking: Reported on 11/07/2016 10/29/16   Harrison Mons, PA-C     Allergies  Allergen Reactions  . Vicodin [Hydrocodone-Acetaminophen] Hives and Itching       Objective:  Physical Exam  Constitutional: She is oriented to person, place, and time. She appears well-developed and well-nourished. She is active and cooperative. No distress.  BP 122/70   Pulse 80   Temp 98.3 F (36.8 C) (Oral)   Resp 16   Ht 5\' 5"  (1.651 m)   Wt 177 lb (80.3 kg)   LMP 09/22/2011   SpO2 97%   BMI 29.45 kg/m   HENT:  Head: Normocephalic and atraumatic.  Right Ear: Hearing normal.  Left Ear: Hearing normal.  Eyes: Conjunctivae are normal. No scleral icterus.  Neck: Normal range of motion. Neck supple. No thyromegaly present.  Cardiovascular: Normal rate, regular rhythm and normal heart sounds.   Pulses:  Radial pulses are 2+ on the right side, and 2+ on the left side.  Pulmonary/Chest: Effort normal and breath sounds normal.  Abdominal: Normal appearance and bowel sounds are normal. There is no hepatosplenomegaly. There is generalized tenderness (mild). There is no rigidity, no rebound, no guarding, no CVA tenderness, no tenderness at McBurney's point and negative Murphy's sign.  Lymphadenopathy:       Head (right side): No tonsillar, no preauricular, no posterior auricular and no occipital adenopathy present.       Head (left side): No tonsillar, no preauricular, no  posterior auricular and no occipital adenopathy present.    She has no cervical adenopathy.       Right: No supraclavicular adenopathy present.       Left: No supraclavicular adenopathy present.  Neurological: She is alert and oriented to person, place, and time. No sensory deficit.  Skin: Skin is warm, dry and intact. No rash noted. No cyanosis or erythema. Nails show no clubbing.  Psychiatric: She has a normal mood and affect. Her speech is normal and behavior is normal.           Assessment & Plan:   1. Diarrhea, unspecified type Likely viral enteritis. Supportive care. Anticipatory guidance provided. RTC if symptoms worsen/persist. - dicyclomine (BENTYL) 10 MG capsule; Take 1-2 capsules (10-20 mg total) by mouth 4 (four) times daily -  before meals and at bedtime. As needed for cramping  Dispense: 60 capsule; Refill: 0   Fara Chute, PA-C Physician Assistant-Certified [facility]

## 2016-12-03 ENCOUNTER — Ambulatory Visit (INDEPENDENT_AMBULATORY_CARE_PROVIDER_SITE_OTHER): Payer: BLUE CROSS/BLUE SHIELD | Admitting: Nurse Practitioner

## 2016-12-03 ENCOUNTER — Encounter: Payer: Self-pay | Admitting: Nurse Practitioner

## 2016-12-03 VITALS — BP 129/79 | HR 70 | Ht 65.0 in | Wt 178.6 lb

## 2016-12-03 DIAGNOSIS — G43909 Migraine, unspecified, not intractable, without status migrainosus: Secondary | ICD-10-CM

## 2016-12-03 DIAGNOSIS — R569 Unspecified convulsions: Secondary | ICD-10-CM

## 2016-12-03 MED ORDER — LEVETIRACETAM 500 MG PO TABS: ORAL_TABLET | ORAL | 3 refills | Status: DC

## 2016-12-03 NOTE — Patient Instructions (Signed)
Continue Keppra at current dose will refill  Call for any seizure activity Continue to be active as much as possible for overall health and well-being Follow-up in 1 year 

## 2016-12-03 NOTE — Progress Notes (Signed)
[facility]  PATIENT: Hannah Nguyen DOB: 15-Apr-1961   REASON FOR VISIT: Follow-up for seizure disorder, migraines HISTORY FROM: Patient    HISTORY OF PRESENT ILLNESS:Hannah Nguyen is a 56 year old female with a history of migraines and seizure disorder. She returns today for yearly follow-up  She is currently taking Keppra 750mg  daily- 500 mg in the PM and 250 mg in the AM. She reports that her last seizure was almost 4.5 years ago. She operates a Teacher, music without difficulty. At home she can complete all ADLs independently. No new neurological complaints. She reports that she has stenosis in the lumbar region at L4-L5. She is  having some  issues with this but she did not  start epidural steroid injections. She continues to work two part-time jobs. She tries to be active. She is wanting to stop her Keppra because she has been so stable however she was made aware this is a lifelong medication due to her seizure disorder. She needs refills on her Keppra.   History 03/16/14 (CM): 55 year old female returns for followup. She has a history of seizure disorder. She was last seen by Dr. Krista Blue 10/07/2013 ,she was placed on extended release Keppra and the patient claims she had side effects to the medication. She called into the office and did not get a response therefore she went back to taking her 500 mg of Keppra at night only. No seizure events in a couple of years. Appetite is reportedly good, she does have some difficulty sleeping at times due to muscle pain in her left thigh. She is due to see a orthopedist. She had a recent CBC and CMP by primary care which was reviewed. She was made aware she's taking suboptimal dose of Keppra. She returns for reevaluation. HISTORY (YY): She has past medical history of hypertension, hyperlipidemia, migraine headaches, presenting with passing out episode in January 29, 2010.  she was with her family at home, after watching TV for a  while in January 29 2010, she decided to fix something for her child, she remember walking to the kitchen, the next thing, she woke up on the floor, EMS were there, all rounding her, she apparently had a passout, there was no evidence she had hurt herself, no evidence of trauma, urinary incontinence, there was no tongue biting. Family started trying to arouse her by rubbing her hands, shaking her, 3-5 minutes before she was able to be aroused completely, but that time EMS has arrived.  She was admitted to hospital on January 29 2010, during her first episode, she was started on Keppra 500 mg twice a day, but she is not taking it regularly. Echocardiogram showed ejection fraction 60-65%, EKG and telemetry monitoring was normal, CT scan of the head was normal.  Since the initial episode, she had episode of uncontrollable right arm shaking, lasting few minutes, without loss of consciousness, she often feel hot, with elevated blood pressure during the episode, this can happen 3 times or evenl more times in a week.  Since Keppra was started, she has much less recurrent episode, and denies any since last seen 08/07/11. Her episode in the past described as stereotypical intermittent right arm uncontrollable shaking, lasting 2-3 minutes, not under her voluntary controlled, no associated right face or right lower extremity shaking. Keppra make her drowsy. EEG showed intermittent right central slowing, occasionally right P4 sharp transient. MRI of the brain has demonstrated supratentorial small vessel disease,atrophy more than expected for her age.  REVIEW OF SYSTEMS: Full 14 system review of systems performed and notable only for those listed, all others are neg:  Constitutional: neg  Cardiovascular: neg Ear/Nose/Throat: neg  Skin: neg Eyes: Light sensitivity Respiratory: neg Gastroitestinal: neg  Hematology/Lymphatic: neg  Endocrine: neg Musculoskeletal: Aching muscles Allergy/Immunology:  neg Neurological: History of seizure disorder and headaches Psychiatric: Depression and anxiety Sleep : neg   ALLERGIES: Allergies  Allergen Reactions  . Vicodin [Hydrocodone-Acetaminophen] Hives and Itching    HOME MEDICATIONS: Outpatient Medications Prior to Visit  Medication Sig Dispense Refill  . atorvastatin (LIPITOR) 20 MG tablet Take 1 tablet (20 mg total) by mouth daily after supper. 90 tablet 3  . benzonatate (TESSALON) 100 MG capsule Take 1-2 capsules (100-200 mg total) by mouth 3 (three) times daily as needed for cough. 40 capsule 0  . celecoxib (CELEBREX) 100 MG capsule Take 1 capsule (100 mg total) by mouth 2 (two) times daily. 60 capsule 3  . Cetirizine HCl (ZYRTEC ALLERGY PO) Take by mouth.    . dicyclomine (BENTYL) 10 MG capsule Take 1-2 capsules (10-20 mg total) by mouth 4 (four) times daily -  before meals and at bedtime. As needed for cramping 60 capsule 0  . levETIRAcetam (KEPPRA) 500 MG tablet 250mg  in am, 500mg  in  pm. 45 tablet 11  . lisinopril-hydrochlorothiazide (PRINZIDE,ZESTORETIC) 20-25 MG tablet Take 1 tablet by mouth daily. 90 tablet 3  . Multiple Vitamins-Minerals (MULTIVITAMINS THER. W/MINERALS) TABS Take 1 tablet by mouth daily.       No facility-administered medications prior to visit.     PAST MEDICAL HISTORY: Past Medical History:  Diagnosis Date  . Bartholin's gland cyst 10/22/2011  . Elevated cholesterol   . GERD (gastroesophageal reflux disease)   . Hypertension   . Kidney infection 05/2015  . Leiomyoma of uterus 10/22/2011  . Migraine   . Nephrolithiasis   . Other and unspecified hyperlipidemia   . Seizures ([place])    2 months ago was last episode of uncontrollable rt arm motions and passed out  . Spinal stenosis 11/2014   L4-L5, L leg pain    PAST SURGICAL HISTORY: Past Surgical History:  Procedure Laterality Date  . ABDOMINAL HYSTERECTOMY    . CESAREAN SECTION     x2  . KNEE ARTHROSCOPY     x2  . LITHOTRIPSY    . ROBOTIC  ASSISTED LAP VAGINAL HYSTERECTOMY  10/25/2011   Procedure: ROBOTIC ASSISTED LAPAROSCOPIC VAGINAL HYSTERECTOMY;  Surgeon: Agnes Lawrence, MD;  Location: [facility];  Service: Gynecology;  Laterality: N/A;  Marsupralization og bartholin gland, cyst biopsy  . thumb surg      FAMILY HISTORY: Family History  Problem Relation Age of Onset  . Diabetes Mother   . Hyperlipidemia Mother   . Hypertension Mother   . Breast cancer Mother 41    s/p bilateral mastectomy  . Diabetes Father   . Hyperlipidemia Father   . Hypertension Father   . Hypertension Sister   . Bipolar disorder Daughter   . Asthma Daughter   . Diabetes Daughter   . Bipolar disorder Son     SOCIAL HISTORY: Social History   Social History  . Marital status: Married    Spouse name: Owens Shark, estranged 07/2016  . Number of children: 2  . Years of education: 13   Occupational History  . [employer]    full time  . office cleaning     part time at night   Social History Main Topics  . Smoking  status: Never Smoker  . Smokeless tobacco: Never Used  . Alcohol use No  . Drug use: No  . Sexual activity: Yes    Birth control/ protection: Surgical   Other Topics Concern  . Not on file   Social History Narrative   Patient lives with her sister since separating from her husband 07/2016.   Patient works full time.   Right handed.    Caffeine- three sodas daily.      Smoke detectors in home? Yes    Guns in home? No    Consistent seatbelt use? Yes    Consistent dental brushing and flossing? Yes    Semi-Annual dental visits: Yes    Annual Eye exams: Yes         PHYSICAL EXAM  Vitals:   12/03/16 0746  BP: 129/79  Pulse: 70  Weight: 178 lb 9.6 oz (81 kg)  Height: 5\' 5"  (1.651 m)   Body mass index is 29.72 kg/m. Generalized: Well developed, in no acute distress  Head: normocephalic and atraumatic,. Oropharynx benign  Musculoskeletal: No deformity   Neurological examination  Mentation:  Alert oriented to time, place, history taking. Attention span and concentration appropriate. Recent and remote memory intact. Follows all commands speech and language fluent.  Cranial nerve II-XII: Pupils were equal round reactive to light extraocular movements were full, visual field were full on confrontational test. Facial sensation and strength were normal. hearing was intact to finger rubbing bilaterally. Uvula tongue midline. head turning and shoulder shrug were normal and symmetric.Tongue protrusion into cheek strength was normal. Motor: normal bulk and tone, full strength in the BUE, BLE, fine finger movements normal, no pronator drift. No focal weakness Coordination: finger-nose-finger, heel-to-shin bilaterally, no dysmetria Reflexes: Symmetric upper and lower, plantar responses were flexor bilaterally. Gait and Station: Rising up from seated position without assistance, normal stance, moderate stride, good arm swing, smooth turning, able to perform tiptoe, and heel walking without difficulty. Tandem gait is unsteady. No assistive device Romberg negative   DIAGNOSTIC DATA (LABS, IMAGING, TESTING) - I reviewed patient records, labs, notes, testing and imaging myself where available.  Lab Results  Component Value Date   WBC 6.8 10/29/2016   HGB 14.8 03/28/2016   HCT 41.1 10/29/2016   MCV 93 10/29/2016   PLT 344 10/29/2016      Component Value Date/Time   NA 141 10/29/2016 1616   K 4.1 10/29/2016 1616   CL 100 10/29/2016 1616   CO2 25 10/29/2016 1616   GLUCOSE 81 10/29/2016 1616   GLUCOSE 81 03/28/2016 0909   BUN 17 10/29/2016 1616   CREATININE 0.72 10/29/2016 1616   CREATININE 0.73 03/28/2016 0909   CALCIUM 9.7 10/29/2016 1616   PROT 7.3 10/29/2016 1616   ALBUMIN 4.4 10/29/2016 1616   AST 26 10/29/2016 1616   ALT 24 10/29/2016 1616   ALKPHOS 111 10/29/2016 1616   BILITOT 0.4 10/29/2016 1616   GFRNONAA 95 10/29/2016 1616   GFRNONAA >89 12/18/2015 0808   GFRAA 110  10/29/2016 1616   GFRAA >89 12/18/2015 0808   Lab Results  Component Value Date   CHOL 257 (H) 10/29/2016   HDL 52 10/29/2016   LDLCALC 185 (H) 10/29/2016   TRIG 100 10/29/2016   CHOLHDL 4.9 (H) 10/29/2016    No results found for: VITAMINB12 Lab Results  Component Value Date   TSH 0.785 10/29/2016      ASSESSMENT AND PLAN 56 y.o. year old female has a past medical history of Hypertension; Elevated  cholesterol; Seizures; Nephrolithiasis; Migraine; and Spinal stenosis (11/2014). here to follow-up for her seizure disorder and her migraines which are in good control. Last seizure 4.5 years ago.  Continue Keppra at current dose will refill  Call for any seizure activity Continue to be active as much as possible for overall health and well-being Follow-up in 1 year Dennie Bible, [facility], [facility], APRN  [facility] [address] [place], [place] [address] [phone]

## 2016-12-06 NOTE — Progress Notes (Signed)
I have reviewed and agreed above plan. 

## 2016-12-10 ENCOUNTER — Ambulatory Visit (INDEPENDENT_AMBULATORY_CARE_PROVIDER_SITE_OTHER): Payer: BLUE CROSS/BLUE SHIELD | Admitting: Family Medicine

## 2016-12-10 VITALS — BP 122/80 | HR 76 | Temp 98.0°F | Resp 18 | Ht 65.0 in | Wt 176.0 lb

## 2016-12-10 DIAGNOSIS — J069 Acute upper respiratory infection, unspecified: Secondary | ICD-10-CM

## 2016-12-10 DIAGNOSIS — Z23 Encounter for immunization: Secondary | ICD-10-CM | POA: Diagnosis not present

## 2016-12-10 DIAGNOSIS — S058X2A Other injuries of left eye and orbit, initial encounter: Secondary | ICD-10-CM

## 2016-12-10 DIAGNOSIS — H538 Other visual disturbances: Secondary | ICD-10-CM | POA: Diagnosis not present

## 2016-12-10 NOTE — Progress Notes (Signed)
Subjective:  By signing my name below, I, Moises Blood, attest that this documentation has been prepared under the direction and in the presence of Merri Ray, MD. Electronically Signed: Moises Blood, [place]. 12/10/2016 , 12:43 PM .  Patient was seen in Room 8 .   Patient ID: Hannah Nguyen, female    DOB: [birth year], 56 y.o.   MRN: [id number] Chief Complaint  Patient presents with  . Sore Throat  . Sinusitis  . Eye Pain    LEFT  . Immunizations    2NB HEP B   HPI Hannah Nguyen is a 56 y.o. female  Here for multiple concerns. Here for sore throat, left eye pain and sinus congestion, as well as second Hep B vaccine.   Left eye pain Patient states irritating left eye pain on going for about a month now. She hasn't seen her eye doctor regarding this issue because her insurance won't cover it until next month. She also notes worsening blurry vision with decreased vision for the past month. Her primary eye doctor is Dr. Ronnald Ramp on [address] and is also followed by Dr. Katy Fitch. She denies any eye drainage. She reports initially thinking it was allergies. She denies placing any drops in her left eye.    Visual Acuity Screening   Right eye Left eye Both eyes  Without correction:     With correction: 20/30 20/50 20/25    Her left eye screening was 20/30 on Dec 12th. She denies wearing corrective.   Sore throat + sinus congestion She also notes having sore throat and sinus congestion with drainage down her throat about 2 days ago. She mentions a few sick contact at work. Her niece also has an infection in her eye. She's been taking mucinex. She denies any fever.   She works at [employer].   Hep B vaccine Initial vaccine given on 10/29/16. She received second vaccine given today.   Patient Active Problem List   Diagnosis Date Noted  . Nephrolithiasis 10/29/2016  . Spinal stenosis of lumbar region 03/07/2015  . BMI 28.0-28.9,adult 03/07/2015  . HTN (hypertension) 01/31/2014    . Hyperlipidemia 01/31/2014  . Migraine   . Partial seizure ([place]) 08/17/2012   Past Medical History:  Diagnosis Date  . Bartholin's gland cyst 10/22/2011  . Elevated cholesterol   . GERD (gastroesophageal reflux disease)   . Hypertension   . Kidney infection 05/2015  . Leiomyoma of uterus 10/22/2011  . Migraine   . Nephrolithiasis   . Other and unspecified hyperlipidemia   . Seizures ([place])    2 months ago was last episode of uncontrollable rt arm motions and passed out  . Spinal stenosis 11/2014   L4-L5, L leg pain   Past Surgical History:  Procedure Laterality Date  . ABDOMINAL HYSTERECTOMY    . CESAREAN SECTION     x2  . KNEE ARTHROSCOPY     x2  . LITHOTRIPSY    . ROBOTIC ASSISTED LAP VAGINAL HYSTERECTOMY  10/25/2011   Procedure: ROBOTIC ASSISTED LAPAROSCOPIC VAGINAL HYSTERECTOMY;  Surgeon: Agnes Lawrence, MD;  Location: [facility];  Service: Gynecology;  Laterality: N/A;  Marsupralization og bartholin gland, cyst biopsy  . thumb surg     Allergies  Allergen Reactions  . Vicodin [Hydrocodone-Acetaminophen] Hives and Itching   Prior to Admission medications   Medication Sig Start Date End Date Taking? Authorizing Provider  atorvastatin (LIPITOR) 20 MG tablet Take 1 tablet (20 mg total) by mouth daily after supper. 10/29/16  Yes Chelle  Jeffery, PA-C  benzonatate (TESSALON) 100 MG capsule Take 1-2 capsules (100-200 mg total) by mouth 3 (three) times daily as needed for cough. 10/29/16  Yes Chelle Jeffery, PA-C  celecoxib (CELEBREX) 100 MG capsule Take 1 capsule (100 mg total) by mouth 2 (two) times daily. 10/29/16  Yes Chelle Jeffery, PA-C  Cetirizine HCl (ZYRTEC ALLERGY PO) Take by mouth.   Yes Historical Provider, MD  dicyclomine (BENTYL) 10 MG capsule Take 1-2 capsules (10-20 mg total) by mouth 4 (four) times daily -  before meals and at bedtime. As needed for cramping 11/07/16  Yes Chelle Jeffery, PA-C  levETIRAcetam (KEPPRA) 500 MG tablet 250mg  in am, 500mg  in  pm.  12/03/16  Yes Dennie Bible, NP  lisinopril-hydrochlorothiazide (PRINZIDE,ZESTORETIC) 20-25 MG tablet Take 1 tablet by mouth daily. 10/29/16  Yes Chelle Jeffery, PA-C  Multiple Vitamins-Minerals (MULTIVITAMINS THER. W/MINERALS) TABS Take 1 tablet by mouth daily.     Yes Historical Provider, MD  [place] life: magnesium, potassium, aspartate 600mg  tabs Taking one daily for muscle cramps   Yes Historical Provider, MD   Social History   Social History  . Marital status: Married    Spouse name: Owens Shark, estranged 07/2016  . Number of children: 2  . Years of education: 13   Occupational History  . [employer]    full time  . office cleaning     part time at night   Social History Main Topics  . Smoking status: Never Smoker  . Smokeless tobacco: Never Used  . Alcohol use No  . Drug use: No  . Sexual activity: Yes    Birth control/ protection: Surgical   Other Topics Concern  . Not on file   Social History Narrative   Patient lives with her sister since separating from her husband 07/2016.   Patient works full time.   Right handed.    Caffeine- three sodas daily.      Smoke detectors in home? Yes    Guns in home? No    Consistent seatbelt use? Yes    Consistent dental brushing and flossing? Yes    Semi-Annual dental visits: Yes    Annual Eye exams: Yes       Review of Systems  Constitutional: Negative for chills, fatigue, fever and unexpected weight change.  HENT: Positive for congestion, postnasal drip, sinus pressure and sore throat.   Eyes: Positive for pain. Negative for discharge and itching.  Respiratory: Negative for cough.   Gastrointestinal: Negative for constipation, diarrhea, nausea and vomiting.  Skin: Negative for rash and wound.  Neurological: Negative for dizziness, weakness and headaches.       Objective:   Physical Exam  Constitutional: She is oriented to person, place, and time. She appears well-developed  and well-nourished. No distress.  HENT:  Head: Normocephalic and atraumatic.  Right Ear: Hearing, tympanic membrane, external ear and ear canal normal.  Left Ear: Hearing, tympanic membrane, external ear and ear canal normal.  Nose: Nose normal.  Mouth/Throat: Oropharynx is clear and moist. No oropharyngeal exudate.  Eyes: Conjunctivae and EOM are normal. Pupils are equal, round, and reactive to light. Right conjunctiva is not injected. Left conjunctiva is not injected.  Slit lamp exam:      The left eye shows fluorescein uptake (very minimal).  Left eye: Anterior chamber appears normal, no scleral injection 2 drops of proparacaine applied: eyelids everted, no foreign bodies or lesions seen Funduscopic lamp, very minimal uptake upper eye, no corneal uptake  Cardiovascular: Normal rate, regular rhythm, normal heart sounds and intact distal pulses.   No murmur heard. Pulmonary/Chest: Effort normal and breath sounds normal. No respiratory distress. She has no wheezes. She has no rhonchi.  Lymphadenopathy:    She has no cervical adenopathy.  Neurological: She is alert and oriented to person, place, and time.  Skin: Skin is warm and dry. No rash noted.  Psychiatric: She has a normal mood and affect. Her behavior is normal.  Vitals reviewed.   Vitals:   12/10/16 1140  BP: 122/80  Pulse: 76  Resp: 18  Temp: 98 F (36.7 C)  TempSrc: Oral  SpO2: 99%  Weight: 176 lb (79.8 kg)  Height: 5\' 5"  (1.651 m)      Assessment & Plan:   Hannah Nguyen is a 56 y.o. female Need for prophylactic vaccination and inoculation against viral hepatitis - Plan: Hepatitis B vaccine adult IM  - Second hep B vaccine given.  Blurred vision, left eye Abrasion of sclera of left eye, initial encounter  - Reported cataract, but now with worsening vision past 1 month. Possible abrasion of upper eye/sclera without apparent corneal involvement. Visual acuity has declined since December office visit. Advised  close follow up for eval with Dr. Katy Fitch next few days, synthetic eyedrops for the next few days as needed, avoid rubbing the eye if possible.  Acute upper respiratory infection  - Symptomatic care discussed,  Mucinex okay, but avoid decongestants with other medical problems. Saline nasal spray, fluids, rest. RTC precautions given.  No orders of the defined types were placed in this encounter.  Patient Instructions    I do not see any abrasions or concerns of your cornea, but there is a small area of uptake over the white part of the eye just above your cornea. I would like you to be evaluated by your eye care provider. I called Dr. Zenia Resides office and they can see you tomorrow, but if you want to stop by there today to determine coverage and other appointment options, they are expecting you. For now, okay to use synthetic eyedrops such as Refresh or Blink. Try to avoid rubbing that eye as may have small abrasion up upper part of eye.  Your sinus and throat symptoms are likely due to an upper respiratory infection or virus.   Upper Respiratory Infection, Adult Most upper respiratory infections (URIs) are a viral infection of the air passages leading to the lungs. A URI affects the nose, throat, and upper air passages. The most common type of URI is nasopharyngitis and is typically referred to as "the common cold." URIs run their course and usually go away on their own. Most of the time, a URI does not require medical attention, but sometimes a bacterial infection in the upper airways can follow a viral infection. This is called a secondary infection. Sinus and middle ear infections are common types of secondary upper respiratory infections. Bacterial pneumonia can also complicate a URI. A URI can worsen asthma and chronic obstructive pulmonary disease (COPD). Sometimes, these complications can require emergency medical care and may be life threatening. What are the causes? Almost all URIs are  caused by viruses. A virus is a type of germ and can spread from one person to another. What increases the risk? You may be at risk for a URI if:  You smoke.  You have chronic heart or lung disease.  You have a weakened defense (immune) system.  You are very young or very old.  You have nasal allergies or asthma.  You work in crowded or poorly ventilated areas.  You work in health care facilities or schools. What are the signs or symptoms? Symptoms typically develop 2-3 days after you come in contact with a cold virus. Most viral URIs last 7-10 days. However, viral URIs from the influenza virus (flu virus) can last 14-18 days and are typically more severe. Symptoms may include:  Runny or stuffy (congested) nose.  Sneezing.  Cough.  Sore throat.  Headache.  Fatigue.  Fever.  Loss of appetite.  Pain in your forehead, behind your eyes, and over your cheekbones (sinus pain).  Muscle aches. How is this diagnosed? Your health care provider may diagnose a URI by:  Physical exam.  Tests to check that your symptoms are not due to another condition such as:  Strep throat.  Sinusitis.  Pneumonia.  Asthma. How is this treated? A URI goes away on its own with time. It cannot be cured with medicines, but medicines may be prescribed or recommended to relieve symptoms. Medicines may help:  Reduce your fever.  Reduce your cough.  Relieve nasal congestion. Follow these instructions at home:  Take medicines only as directed by your health care provider.  Gargle warm saltwater or take cough drops to comfort your throat as directed by your health care provider.  Use a warm mist humidifier or inhale steam from a shower to increase air moisture. This may make it easier to breathe.  Drink enough fluid to keep your urine clear or pale yellow.  Eat soups and other clear broths and maintain good nutrition.  Rest as needed.  Return to work when your temperature has  returned to normal or as your health care provider advises. You may need to stay home longer to avoid infecting others. You can also use a face mask and careful hand washing to prevent spread of the virus.  Increase the usage of your inhaler if you have asthma.  Do not use any tobacco products, including cigarettes, chewing tobacco, or electronic cigarettes. If you need help quitting, ask your health care provider. How is this prevented? The best way to protect yourself from getting a cold is to practice good hygiene.  Avoid oral or hand contact with people with cold symptoms.  Wash your hands often if contact occurs. There is no clear evidence that vitamin C, vitamin E, echinacea, or exercise reduces the chance of developing a cold. However, it is always recommended to get plenty of rest, exercise, and practice good nutrition. Contact a health care provider if:  You are getting worse rather than better.  Your symptoms are not controlled by medicine.  You have chills.  You have worsening shortness of breath.  You have brown or red mucus.  You have yellow or brown nasal discharge.  You have pain in your face, especially when you bend forward.  You have a fever.  You have swollen neck glands.  You have pain while swallowing.  You have white areas in the back of your throat. Get help right away if:  You have severe or persistent:  Headache.  Ear pain.  Sinus pain.  Chest pain.  You have chronic lung disease and any of the following:  Wheezing.  Prolonged cough.  Coughing up blood.  A change in your usual mucus.  You have a stiff neck.  You have changes in your:  Vision.  Hearing.  Thinking.  Mood. This information is not intended to replace advice  given to you by your health care provider. Make sure you discuss any questions you have with your health care provider. Document Released: 04/30/2001 Document Revised: 07/07/2016 Document Reviewed:  02/09/2014 Elsevier Interactive Patient Education  2017 [employer].   IF you received an x-ray today, you will receive an invoice from [facility]. Please contact [facility] at [phone] with questions or concerns regarding your invoice.   IF you received labwork today, you will receive an invoice from [place]. Please contact [facility] at [phone] with questions or concerns regarding your invoice.   Our billing staff will not be able to assist you with questions regarding bills from these companies.  You will be contacted with the lab results as soon as they are available. The fastest way to get your results is to activate your My Chart account. Instructions are located on the last page of this paperwork. If you have not heard from [place] regarding the results in 2 weeks, please contact this office.      I personally performed the services described in this documentation, which was scribed in my presence. The recorded information has been reviewed and considered, and addended by me as needed.   Signed,   Merri Ray, MD [facility].  12/10/16 1:10 PM

## 2016-12-10 NOTE — Patient Instructions (Addendum)
I do not see any abrasions or concerns of your cornea, but there is a small area of uptake over the white part of the eye just above your cornea. I would like you to be evaluated by your eye care provider. I called Dr. Zenia Resides office and they can see you tomorrow, but if you want to stop by there today to determine coverage and other appointment options, they are expecting you. For now, okay to use synthetic eyedrops such as Refresh or Blink. Try to avoid rubbing that eye as may have small abrasion up upper part of eye.  Your sinus and throat symptoms are likely due to an upper respiratory infection or virus.   Upper Respiratory Infection, Adult Most upper respiratory infections (URIs) are a viral infection of the air passages leading to the lungs. A URI affects the nose, throat, and upper air passages. The most common type of URI is nasopharyngitis and is typically referred to as "the common cold." URIs run their course and usually go away on their own. Most of the time, a URI does not require medical attention, but sometimes a bacterial infection in the upper airways can follow a viral infection. This is called a secondary infection. Sinus and middle ear infections are common types of secondary upper respiratory infections. Bacterial pneumonia can also complicate a URI. A URI can worsen asthma and chronic obstructive pulmonary disease (COPD). Sometimes, these complications can require emergency medical care and may be life threatening. What are the causes? Almost all URIs are caused by viruses. A virus is a type of germ and can spread from one person to another. What increases the risk? You may be at risk for a URI if:  You smoke.  You have chronic heart or lung disease.  You have a weakened defense (immune) system.  You are very young or very old.  You have nasal allergies or asthma.  You work in crowded or poorly ventilated areas.  You work in health care facilities or schools. What  are the signs or symptoms? Symptoms typically develop 2-3 days after you come in contact with a cold virus. Most viral URIs last 7-10 days. However, viral URIs from the influenza virus (flu virus) can last 14-18 days and are typically more severe. Symptoms may include:  Runny or stuffy (congested) nose.  Sneezing.  Cough.  Sore throat.  Headache.  Fatigue.  Fever.  Loss of appetite.  Pain in your forehead, behind your eyes, and over your cheekbones (sinus pain).  Muscle aches. How is this diagnosed? Your health care provider may diagnose a URI by:  Physical exam.  Tests to check that your symptoms are not due to another condition such as:  Strep throat.  Sinusitis.  Pneumonia.  Asthma. How is this treated? A URI goes away on its own with time. It cannot be cured with medicines, but medicines may be prescribed or recommended to relieve symptoms. Medicines may help:  Reduce your fever.  Reduce your cough.  Relieve nasal congestion. Follow these instructions at home:  Take medicines only as directed by your health care provider.  Gargle warm saltwater or take cough drops to comfort your throat as directed by your health care provider.  Use a warm mist humidifier or inhale steam from a shower to increase air moisture. This may make it easier to breathe.  Drink enough fluid to keep your urine clear or pale yellow.  Eat soups and other clear broths and maintain good nutrition.  Rest as  needed.  Return to work when your temperature has returned to normal or as your health care provider advises. You may need to stay home longer to avoid infecting others. You can also use a face mask and careful hand washing to prevent spread of the virus.  Increase the usage of your inhaler if you have asthma.  Do not use any tobacco products, including cigarettes, chewing tobacco, or electronic cigarettes. If you need help quitting, ask your health care provider. How is this  prevented? The best way to protect yourself from getting a cold is to practice good hygiene.  Avoid oral or hand contact with people with cold symptoms.  Wash your hands often if contact occurs. There is no clear evidence that vitamin C, vitamin E, echinacea, or exercise reduces the chance of developing a cold. However, it is always recommended to get plenty of rest, exercise, and practice good nutrition. Contact a health care provider if:  You are getting worse rather than better.  Your symptoms are not controlled by medicine.  You have chills.  You have worsening shortness of breath.  You have brown or red mucus.  You have yellow or brown nasal discharge.  You have pain in your face, especially when you bend forward.  You have a fever.  You have swollen neck glands.  You have pain while swallowing.  You have white areas in the back of your throat. Get help right away if:  You have severe or persistent:  Headache.  Ear pain.  Sinus pain.  Chest pain.  You have chronic lung disease and any of the following:  Wheezing.  Prolonged cough.  Coughing up blood.  A change in your usual mucus.  You have a stiff neck.  You have changes in your:  Vision.  Hearing.  Thinking.  Mood. This information is not intended to replace advice given to you by your health care provider. Make sure you discuss any questions you have with your health care provider. Document Released: 04/30/2001 Document Revised: 07/07/2016 Document Reviewed: 02/09/2014 Elsevier Interactive Patient Education  2017 [employer].   IF you received an x-ray today, you will receive an invoice from [facility]. Please contact [facility] at [phone] with questions or concerns regarding your invoice.   IF you received labwork today, you will receive an invoice from [place]. Please contact [facility] at [phone] with questions or concerns regarding your invoice.   Our  billing staff will not be able to assist you with questions regarding bills from these companies.  You will be contacted with the lab results as soon as they are available. The fastest way to get your results is to activate your My Chart account. Instructions are located on the last page of this paperwork. If you have not heard from [place] regarding the results in 2 weeks, please contact this office.

## 2016-12-12 ENCOUNTER — Ambulatory Visit (INDEPENDENT_AMBULATORY_CARE_PROVIDER_SITE_OTHER): Payer: BLUE CROSS/BLUE SHIELD | Admitting: Physician Assistant

## 2016-12-12 VITALS — BP 116/84 | HR 101 | Temp 98.9°F | Resp 16 | Ht 65.0 in | Wt 175.8 lb

## 2016-12-12 DIAGNOSIS — J069 Acute upper respiratory infection, unspecified: Secondary | ICD-10-CM

## 2016-12-12 MED ORDER — CODEINE POLT-CHLORPHEN POLT ER 14.7-2.8 MG/5ML PO SUER: 10.0000 mL | Freq: Two times a day (BID) | ORAL | 0 refills | Status: AC | PRN

## 2016-12-12 MED ORDER — IPRATROPIUM BROMIDE 0.03 % NA SOLN: 2.0000 | Freq: Two times a day (BID) | NASAL | 0 refills | Status: DC

## 2016-12-12 NOTE — Patient Instructions (Addendum)
If you have a reaction to the cough medication (contains CODEINE), stop it and take BENADRYL and let me know.    IF you received an x-ray today, you will receive an invoice from [facility]. Please contact [facility] at [phone] with questions or concerns regarding your invoice.   IF you received labwork today, you will receive an invoice from [place]. Please contact [facility] at [phone] with questions or concerns regarding your invoice.   Our billing staff will not be able to assist you with questions regarding bills from these companies.  You will be contacted with the lab results as soon as they are available. The fastest way to get your results is to activate your My Chart account. Instructions are located on the last page of this paperwork. If you have not heard from [place] regarding the results in 2 weeks, please contact this office.

## 2016-12-12 NOTE — Progress Notes (Signed)
Patient ID: Hannah Nguyen, female    DOB: 12-Dec-1960, 56 y.o.   MRN: [id number]  PCP: Harrison Mons, PA-C  Chief Complaint  Patient presents with  . Cough    x 2 days worsen  . Nasal Congestion    WITH PRESSURE    Subjective:   Presents for evaluation of nasal congestion and cough x 4 days.  She was seen here 12/10/2016 and advised to take OTC symptomatic treatment. She reports increasing nasal congestion, headache, drainage and sore throat. Cough is worse x 2 days.  No fever/chills, GI/GU symptoms. No SOB, CP, wheezing.   Review of Systems As above.    Patient Active Problem List   Diagnosis Date Noted  . Nephrolithiasis 10/29/2016  . Spinal stenosis of lumbar region 03/07/2015  . BMI 28.0-28.9,adult 03/07/2015  . HTN (hypertension) 01/31/2014  . Hyperlipidemia 01/31/2014  . Migraine   . Partial seizure ([place]) 08/17/2012     Prior to Admission medications   Medication Sig Start Date End Date Taking? Authorizing Provider  atorvastatin (LIPITOR) 20 MG tablet Take 1 tablet (20 mg total) by mouth daily after supper. 10/29/16  Yes Shaneece Stockburger, PA-C  benzonatate (TESSALON) 100 MG capsule Take 1-2 capsules (100-200 mg total) by mouth 3 (three) times daily as needed for cough. 10/29/16  Yes Manroop Jakubowicz, PA-C  celecoxib (CELEBREX) 100 MG capsule Take 1 capsule (100 mg total) by mouth 2 (two) times daily. 10/29/16  Yes Emanuelle Hammerstrom, PA-C  Cetirizine HCl (ZYRTEC ALLERGY PO) Take by mouth.   Yes Historical Provider, MD  IBUPROFEN PO Take by mouth as needed.   Yes Historical Provider, MD  levETIRAcetam (KEPPRA) 500 MG tablet 250mg  in am, 500mg  in  pm. 12/03/16  Yes Dennie Bible, NP  lisinopril-hydrochlorothiazide (PRINZIDE,ZESTORETIC) 20-25 MG tablet Take 1 tablet by mouth daily. 10/29/16  Yes Fatim Vanderschaaf, PA-C  Multiple Vitamins-Minerals (MULTIVITAMINS THER. W/MINERALS) TABS Take 1 tablet by mouth daily.     Yes Historical Provider, MD  [place] life: magnesium, potassium, aspartate 600mg  tabs Taking one daily for muscle cramps   Yes Historical Provider, MD  dicyclomine (BENTYL) 10 MG capsule Take 1-2 capsules (10-20 mg total) by mouth 4 (four) times daily -  before meals and at bedtime. As needed for cramping Patient not taking: Reported on 12/12/2016 11/07/16   Harrison Mons, PA-C     Allergies  Allergen Reactions  . Vicodin [Hydrocodone-Acetaminophen] Hives and Itching       Objective:  Physical Exam  Constitutional: She is oriented to person, place, and time. She appears well-developed and well-nourished. No distress.  BP 116/84 (BP Location: Right Arm, Patient Position: Sitting, Cuff Size: Large)   Pulse (!) 101   Temp 98.9 F (37.2 C) (Oral)   Resp 16   Ht 5\' 5"  (1.651 m)   Wt 175 lb 12.8 oz (79.7 kg)   LMP 09/22/2011   SpO2 97%   BMI 29.25 kg/m    HENT:  Head: Normocephalic and atraumatic.  Right Ear: Hearing, tympanic membrane, external ear and ear canal normal.  Left Ear: Hearing, tympanic membrane, external ear and ear canal normal.  Nose: Mucosal edema and rhinorrhea present.  No foreign bodies. Right sinus exhibits no maxillary sinus tenderness and no frontal sinus tenderness. Left sinus exhibits no maxillary sinus tenderness and no frontal sinus tenderness.  Mouth/Throat: Uvula is midline, oropharynx is clear and moist and mucous membranes are normal. No uvula swelling. No oropharyngeal exudate.  Eyes: Conjunctivae and EOM are normal. Pupils are equal, round, and reactive to light. Right eye exhibits no discharge. Left eye exhibits no discharge. No scleral icterus.  Neck: Trachea normal, normal range of motion and full passive range of motion without pain. Neck supple. No thyroid mass and no thyromegaly present.  Cardiovascular: Normal rate, regular rhythm and normal heart sounds.   Pulmonary/Chest: Effort normal and breath sounds normal.  Lymphadenopathy:       Head (right side):  No submandibular, no tonsillar, no preauricular, no posterior auricular and no occipital adenopathy present.       Head (left side): No submandibular, no tonsillar, no preauricular and no occipital adenopathy present.    She has no cervical adenopathy.       Right: No supraclavicular adenopathy present.       Left: No supraclavicular adenopathy present.  Neurological: She is alert and oriented to person, place, and time. She has normal strength. No cranial nerve deficit or sensory deficit.  Skin: Skin is warm, dry and intact. No rash noted.  Psychiatric: She has a normal mood and affect. Her speech is normal and behavior is normal.           Assessment & Plan:   1. Acute upper respiratory infection Still suspect viral illness. Continue supportive care.  Add Atrovent NS and stronger cough suppressant. Anticipatory guidance.  RTC if symptoms worsen/persist. - ipratropium (ATROVENT) 0.03 % nasal spray; Place 2 sprays into both nostrils 2 (two) times daily.  Dispense: 30 mL; Refill: 0 - Codeine Polt-Chlorphen Polt ER (TUZISTRA XR) 14.7-2.8 MG/5ML SUER; Take 10 mLs by mouth every 12 (twelve) hours as needed.  Dispense: 200 mL; Refill: 0   Fara Chute, PA-C Physician Assistant-Certified [facility] at [place]

## 2016-12-12 NOTE — Progress Notes (Signed)
Patient ID: Hannah Nguyen, female    DOB: January 01, 1961, 56 y.o.   MRN: [id number]  PCP: Harrison Mons, PA-C  Chief Complaint  Patient presents with  . Cough    x 2 days worsen  . Nasal Congestion    WITH PRESSURE    Subjective:   Presents for evaluation of cough and nasal congestion.  Pt is a 56yo female who presents with 4 days of cough and nasal congestion. She was seen in this office by Dr. Carlota Raspberry on 12/10/16 and was advised to take OTC antiinflammatories and Mucinex for symptomatic relief of cold symptoms. She states that she has had increasing nasal congestion, headache, post nasal drip, sore throat, and worsened cough. She states that she has been taking mucinex and Ibuprofen with some relief. She denies fever, chills, SOB, hemoptysis, chest pain, abdominal pain, nausea, vomiting, diarrhea, or constipation. She is concerned about missing work because of illness, works in [employer].    Review of Systems See HPI    Patient Active Problem List   Diagnosis Date Noted  . Nephrolithiasis 10/29/2016  . Spinal stenosis of lumbar region 03/07/2015  . BMI 28.0-28.9,adult 03/07/2015  . HTN (hypertension) 01/31/2014  . Hyperlipidemia 01/31/2014  . Migraine   . Partial seizure ([name]) 08/17/2012     Prior to Admission medications   Medication Sig Start Date End Date Taking? Authorizing Provider  atorvastatin (LIPITOR) 20 MG tablet Take 1 tablet (20 mg total) by mouth daily after supper. 10/29/16  Yes Chelle Jeffery, PA-C  benzonatate (TESSALON) 100 MG capsule Take 1-2 capsules (100-200 mg total) by mouth 3 (three) times daily as needed for cough. 10/29/16  Yes Chelle Jeffery, PA-C  celecoxib (CELEBREX) 100 MG capsule Take 1 capsule (100 mg total) by mouth 2 (two) times daily. 10/29/16  Yes Chelle Jeffery, PA-C  Cetirizine HCl (ZYRTEC ALLERGY PO) Take by mouth.   Yes Historical Provider, MD  IBUPROFEN PO Take by mouth as needed.   Yes Historical Provider, MD    levETIRAcetam (KEPPRA) 500 MG tablet 250mg  in am, 500mg  in  pm. 12/03/16  Yes Dennie Bible, NP  lisinopril-hydrochlorothiazide (PRINZIDE,ZESTORETIC) 20-25 MG tablet Take 1 tablet by mouth daily. 10/29/16  Yes Chelle Jeffery, PA-C  Multiple Vitamins-Minerals (MULTIVITAMINS THER. W/MINERALS) TABS Take 1 tablet by mouth daily.     Yes Historical Provider, MD  [place] life: magnesium, potassium, aspartate 600mg  tabs Taking one daily for muscle cramps   Yes Historical Provider, MD  dicyclomine (BENTYL) 10 MG capsule Take 1-2 capsules (10-20 mg total) by mouth 4 (four) times daily -  before meals and at bedtime. As needed for cramping Patient not taking: Reported on 12/12/2016 11/07/16   Harrison Mons, PA-C     Allergies  Allergen Reactions  . Vicodin [Hydrocodone-Acetaminophen] Hives and Itching       Objective:  Physical Exam HEENT: Ear canals clear bilaterally, TMs intact, nonbulging, nonerythematous. Throat nonerythematous, no exudates; nasal turbinates midly erythematous bilaterally. mildly clear rhinorrhea present. Pulm: Good respiratory effort. CTAB. No wheezes, rales, or rhonchi. CV: RRR. No M/R/G. Abd: Soft, nontender.     Assessment & Plan:   1. Acute upper respiratory infection Most likely viral in origin. Pt advised to return if fever arises or if symptoms worsen.  - ipratropium (ATROVENT) 0.03 % nasal spray; Place 2 sprays into both nostrils 2 (two) times daily.  Dispense: 30 mL; Refill: 0 - Codeine Polt-Chlorphen Polt ER (TUZISTRA XR) 14.7-2.8 MG/5ML SUER;  Take 10 mLs by mouth every 12 (twelve) hours as needed.  Dispense: 200 mL; Refill: 0  Lorella Nimrod, PA-S

## 2016-12-14 ENCOUNTER — Ambulatory Visit: Payer: BLUE CROSS/BLUE SHIELD

## 2016-12-17 DIAGNOSIS — H524 Presbyopia: Secondary | ICD-10-CM | POA: Diagnosis not present

## 2017-01-21 ENCOUNTER — Other Ambulatory Visit: Payer: Self-pay | Admitting: Physician Assistant

## 2017-01-21 DIAGNOSIS — J069 Acute upper respiratory infection, unspecified: Secondary | ICD-10-CM

## 2017-02-26 ENCOUNTER — Ambulatory Visit (INDEPENDENT_AMBULATORY_CARE_PROVIDER_SITE_OTHER): Payer: BLUE CROSS/BLUE SHIELD | Admitting: Family Medicine

## 2017-02-26 ENCOUNTER — Ambulatory Visit (INDEPENDENT_AMBULATORY_CARE_PROVIDER_SITE_OTHER): Payer: BLUE CROSS/BLUE SHIELD

## 2017-02-26 VITALS — BP 132/81 | HR 77 | Temp 98.0°F | Resp 18 | Ht 65.0 in | Wt 181.4 lb

## 2017-02-26 DIAGNOSIS — M25562 Pain in left knee: Secondary | ICD-10-CM

## 2017-02-26 MED ORDER — DICLOFENAC SODIUM 75 MG PO TBEC: 75.0000 mg | DELAYED_RELEASE_TABLET | Freq: Two times a day (BID) | ORAL | 0 refills | Status: DC | PRN

## 2017-02-26 NOTE — Patient Instructions (Addendum)
It was good to meet you today.  Wear the knee brace when you are out or when you are at work.  When you're at home keep the leg elevated. Use ice 20 minutes on and 20 minutes off for relief.  Take the diclofenac twice a day (once the morning and once in the evening) as needed for pain and inflammation.  We will put in a referral to orthopedics today. I am concerned you have a meniscal tear. However if you're an established patient it is often easier or quicker if you to contact their office to be seen.    IF you received an x-ray today, you will receive an invoice from [facility]. Please contact [facility] at [phone] with questions or concerns regarding your invoice.   IF you received labwork today, you will receive an invoice from [place]. Please contact [facility] at [phone] with questions or concerns regarding your invoice.   Our billing staff will not be able to assist you with questions regarding bills from these companies.  You will be contacted with the lab results as soon as they are available. The fastest way to get your results is to activate your My Chart account. Instructions are located on the last page of this paperwork. If you have not heard from [place] regarding the results in 2 weeks, please contact this office.

## 2017-02-26 NOTE — Progress Notes (Addendum)
Subjective:    Hannah Nguyen is a 56 y.o. female who presents to PCP today for Left knee pain: x 1 day:  1.  Left knee pain:  Started today around noon.  She was working at her job and was moving from squatting to standing when she heard and felt a "pop" in her left knee and had immediate pain on the medial side of her knee.  She has been walking with a limp since then.  Hasn't felt any swelling.  No bruising.    She has history of spinal stenosis as well as knee arthroscopy for what sounds like arthritis.  Receives corticosteroid injections in that knee as well fairly regularly. Last was about 1 year ago. She is followed by Dr. Berenice Primas at [place].  Distant history (teenager) of "torn ligaments" in knee, but unsure exactly what.    ROS as above per HPI.    The following portions of the patient's history were reviewed and updated as appropriate: allergies, current medications, past medical history, family and social history, and problem list. Patient is a nonsmoker.    PMH reviewed.  Past Medical History:  Diagnosis Date  . Bartholin's gland cyst 10/22/2011  . Elevated cholesterol   . GERD (gastroesophageal reflux disease)   . Hypertension   . Kidney infection 05/2015  . Leiomyoma of uterus 10/22/2011  . Migraine   . Nephrolithiasis   . Other and unspecified hyperlipidemia   . Seizures ([place])    2 months ago was last episode of uncontrollable rt arm motions and passed out  . Spinal stenosis 11/2014   L4-L5, L leg pain   Past Surgical History:  Procedure Laterality Date  . ABDOMINAL HYSTERECTOMY    . CESAREAN SECTION     x2  . KNEE ARTHROSCOPY     x2  . LITHOTRIPSY    . ROBOTIC ASSISTED LAP VAGINAL HYSTERECTOMY  10/25/2011   Procedure: ROBOTIC ASSISTED LAPAROSCOPIC VAGINAL HYSTERECTOMY;  Surgeon: Agnes Lawrence, MD;  Location: [facility];  Service: Gynecology;  Laterality: N/A;  Marsupralization og bartholin gland, cyst biopsy  . thumb surg      Medications  reviewed. Current Outpatient Prescriptions  Medication Sig Dispense Refill  . atorvastatin (LIPITOR) 20 MG tablet Take 1 tablet (20 mg total) by mouth daily after supper. 90 tablet 3  . benzonatate (TESSALON) 100 MG capsule Take 1-2 capsules (100-200 mg total) by mouth 3 (three) times daily as needed for cough. 40 capsule 0  . celecoxib (CELEBREX) 100 MG capsule Take 1 capsule (100 mg total) by mouth 2 (two) times daily. 60 capsule 3  . Cetirizine HCl (ZYRTEC ALLERGY PO) Take by mouth.    . IBUPROFEN PO Take by mouth as needed.    Marland Kitchen ipratropium (ATROVENT) 0.03 % nasal spray Place 2 sprays into both nostrils 2 (two) times daily. 30 mL 0  . levETIRAcetam (KEPPRA) 500 MG tablet 250mg  in am, 500mg  in  pm. 135 tablet 3  . lisinopril-hydrochlorothiazide (PRINZIDE,ZESTORETIC) 20-25 MG tablet Take 1 tablet by mouth daily. 90 tablet 3  . Multiple Vitamins-Minerals (MULTIVITAMINS THER. W/MINERALS) TABS Take 1 tablet by mouth daily.      Marland Kitchen OVER THE COUNTER MEDICATION [place] life: magnesium, potassium, aspartate 600mg  tabs Taking one daily for muscle cramps    . dicyclomine (BENTYL) 10 MG capsule Take 1-2 capsules (10-20 mg total) by mouth 4 (four) times daily -  before meals and at bedtime. As needed for cramping (Patient not taking: Reported on 12/12/2016) 60  capsule 0   No current facility-administered medications for this visit.      Objective:   Physical Exam BP 132/81   Pulse 77   Temp 98 F (36.7 C) (Oral)   Resp 18   Ht 5\' 5"  (1.651 m)   Wt 181 lb 6.4 oz (82.3 kg)   LMP 09/22/2011   SpO2 98%   BMI 30.19 kg/m  Gen:  Alert, cooperative patient who appears stated age in no acute distress.  Vital signs reviewed. HEENT: EOMI,  MMM MSK:  Right knee WNL Left knee:  Minimal effusion. To right knee. No bruising. She is tender along the medial joint line and basically along the medial and inferior portion of her patella. Some less tenderness on the lateral portion of the patella. She has pain  with full extension of knee and also resisted extension. Some very minimal pain with resisted flexion of knee.  No Baker's cyst palpated. No joint laxity noted. Anterior drawer and posterior drawer tests are negative. Some apprehension with testing of medial collateral ligaments.   No results found for this or any previous visit (from the past 72 hour(s)).  Imp/Plan:  1.  Left knee pain:  - I am concerned for possible meniscal tear based on her symptoms.  Sudden pop when squatting resulting in medial anterior knee pain.  TTP medial joint line - negative radiographs here - long-standing history of issues in Right knee - plan is to refer to Dr. Berenice Primas, her current Orthopedist.   - Diclofenac for pain relief.  Told her stop to the Naproxyn she was taking.  No Tramadol as she has seizure history -- on Keppra.  - Hinge brace applied here.  - FU w/ Ortho.  Referral placed today

## 2017-03-04 DIAGNOSIS — M222X2 Patellofemoral disorders, left knee: Secondary | ICD-10-CM | POA: Diagnosis not present

## 2017-03-06 DIAGNOSIS — M222X2 Patellofemoral disorders, left knee: Secondary | ICD-10-CM | POA: Diagnosis not present

## 2017-03-06 DIAGNOSIS — M25662 Stiffness of left knee, not elsewhere classified: Secondary | ICD-10-CM | POA: Diagnosis not present

## 2017-03-06 DIAGNOSIS — M25562 Pain in left knee: Secondary | ICD-10-CM | POA: Diagnosis not present

## 2017-03-11 DIAGNOSIS — M25662 Stiffness of left knee, not elsewhere classified: Secondary | ICD-10-CM | POA: Diagnosis not present

## 2017-03-11 DIAGNOSIS — M25562 Pain in left knee: Secondary | ICD-10-CM | POA: Diagnosis not present

## 2017-03-11 DIAGNOSIS — M222X2 Patellofemoral disorders, left knee: Secondary | ICD-10-CM | POA: Diagnosis not present

## 2017-03-13 DIAGNOSIS — M25662 Stiffness of left knee, not elsewhere classified: Secondary | ICD-10-CM | POA: Diagnosis not present

## 2017-03-13 DIAGNOSIS — M222X2 Patellofemoral disorders, left knee: Secondary | ICD-10-CM | POA: Diagnosis not present

## 2017-03-13 DIAGNOSIS — M25562 Pain in left knee: Secondary | ICD-10-CM | POA: Diagnosis not present

## 2017-03-18 DIAGNOSIS — M25662 Stiffness of left knee, not elsewhere classified: Secondary | ICD-10-CM | POA: Diagnosis not present

## 2017-03-18 DIAGNOSIS — M25562 Pain in left knee: Secondary | ICD-10-CM | POA: Diagnosis not present

## 2017-03-18 DIAGNOSIS — M222X2 Patellofemoral disorders, left knee: Secondary | ICD-10-CM | POA: Diagnosis not present

## 2017-03-20 DIAGNOSIS — M222X2 Patellofemoral disorders, left knee: Secondary | ICD-10-CM | POA: Diagnosis not present

## 2017-03-20 DIAGNOSIS — M25662 Stiffness of left knee, not elsewhere classified: Secondary | ICD-10-CM | POA: Diagnosis not present

## 2017-03-20 DIAGNOSIS — M25562 Pain in left knee: Secondary | ICD-10-CM | POA: Diagnosis not present

## 2017-03-25 DIAGNOSIS — M25562 Pain in left knee: Secondary | ICD-10-CM | POA: Diagnosis not present

## 2017-03-27 DIAGNOSIS — M25562 Pain in left knee: Secondary | ICD-10-CM | POA: Diagnosis not present

## 2017-04-08 ENCOUNTER — Encounter: Payer: Self-pay | Admitting: Physician Assistant

## 2017-04-08 ENCOUNTER — Ambulatory Visit (INDEPENDENT_AMBULATORY_CARE_PROVIDER_SITE_OTHER): Payer: BLUE CROSS/BLUE SHIELD | Admitting: Physician Assistant

## 2017-04-08 VITALS — BP 121/78 | HR 87 | Temp 98.3°F | Resp 16 | Ht 65.0 in | Wt 181.2 lb

## 2017-04-08 DIAGNOSIS — R252 Cramp and spasm: Secondary | ICD-10-CM

## 2017-04-08 DIAGNOSIS — Z6830 Body mass index (BMI) 30.0-30.9, adult: Secondary | ICD-10-CM | POA: Diagnosis not present

## 2017-04-08 DIAGNOSIS — E785 Hyperlipidemia, unspecified: Secondary | ICD-10-CM | POA: Diagnosis not present

## 2017-04-08 DIAGNOSIS — I10 Essential (primary) hypertension: Secondary | ICD-10-CM | POA: Diagnosis not present

## 2017-04-08 LAB — POCT URINALYSIS DIP (MANUAL ENTRY)
Bilirubin, UA: NEGATIVE
Blood, UA: NEGATIVE
Glucose, UA: NEGATIVE mg/dL
LEUKOCYTES UA: NEGATIVE
Nitrite, UA: NEGATIVE
PROTEIN UA: NEGATIVE mg/dL
Spec Grav, UA: 1.025 (ref 1.010–1.025)
UROBILINOGEN UA: 1 U/dL
pH, UA: 6 (ref 5.0–8.0)

## 2017-04-08 NOTE — Progress Notes (Signed)
Patient ID: Hannah Nguyen, female    DOB: 1961-05-05, 56 y.o.   MRN: [id number]  PCP: Harrison Mons, PA-C  Chief Complaint  Patient presents with  . muscle cramps    left leg x 3 days    Subjective:   Presents for evaluation of cramping in the LEFT leg.  3-4 days of cramping in the LEFT thigh. Has to get up and walk around and massage it. Resolves in about 15 minutes, but recurs in a couple of hours. Some soreness persists.  Saw orthopedics a couple of weeks ago. Received an injection in the LEFT knee. If it's not beneficial, they're planning arthroscopy. She was to wear a knee brace, but when the cramping began, she stopped it, concerned that during a cramp the knee may get stuck.  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 Has been at this job x 3 years. Knows that she doesn't drink enough water. Eating more bananas.  Not fasting today. Banana, Mashed potatoes and teriyaki meatballs about 1 pm today.   Review of Systems As above. No CP, SOB, HA, dizziness. No nausea, vomiting or diarrhea.    Patient Active Problem List   Diagnosis Date Noted  . Nephrolithiasis 10/29/2016  . Spinal stenosis of lumbar region 03/07/2015  . BMI 28.0-28.9,adult 03/07/2015  . HTN (hypertension) 01/31/2014  . Hyperlipidemia 01/31/2014  . Migraine   . Partial seizure ([place]) 08/17/2012     Prior to Admission medications   Medication Sig Start Date End Date Taking? Authorizing Provider  atorvastatin (LIPITOR) 20 MG tablet Take 1 tablet (20 mg total) by mouth daily after supper. 10/29/16  Yes Amaar Oshita, PA-C  Cetirizine HCl (ZYRTEC ALLERGY PO) Take by mouth.   Yes [provider]  IBUPROFEN PO Take by mouth as needed.   Yes [provider]  levETIRAcetam (KEPPRA) 500 MG tablet 250mg  in am, 500mg  in  pm. 12/03/16  Yes Dennie Bible, NP  lisinopril-hydrochlorothiazide (PRINZIDE,ZESTORETIC) 20-25 MG tablet Take  1 tablet by mouth daily. 10/29/16  Yes Awilda Covin, PA-C  Multiple Vitamins-Minerals (MULTIVITAMINS THER. W/MINERALS) TABS Take 1 tablet by mouth daily.     Yes [provider]  celecoxib (CELEBREX) 100 MG capsule Take 1 capsule (100 mg total) by mouth 2 (two) times daily. Patient not taking: Reported on 04/08/2017 10/29/16   Harrison Mons, PA-C  OVER THE COUNTER MEDICATION [place] life: magnesium, potassium, aspartate 600mg  tabs Taking one daily for muscle cramps    [provider]     Allergies  Allergen Reactions  . Vicodin [Hydrocodone-Acetaminophen] Hives and Itching       Objective:  Physical Exam  Constitutional: She is oriented to person, place, and time. She appears well-developed and well-nourished. She is active and cooperative. No distress.  BP 121/78   Pulse 87   Temp 98.3 F (36.8 C) (Oral)   Resp 16   Ht 5\' 5"  (1.651 m)   Wt 181 lb 3.2 oz (82.2 kg)   LMP 09/22/2011   SpO2 97%   BMI 30.15 kg/m   HENT:  Head: Normocephalic and atraumatic.  Right Ear: Hearing normal.  Left Ear: Hearing normal.  Eyes: Conjunctivae are normal. No scleral icterus.  Neck: Normal range of motion. Neck supple. No thyromegaly present.  Cardiovascular: Normal rate, regular rhythm and normal heart sounds.   Pulses:      Radial pulses are 2+ on the right side, and 2+ on the  left side.  Pulmonary/Chest: Effort normal and breath sounds normal.  Musculoskeletal:       Right upper leg: Normal.       Left upper leg: She exhibits tenderness. She exhibits no bony tenderness, no swelling, no edema, no deformity and no laceration.       Left lower leg: Normal.       Legs: Lymphadenopathy:       Head (right side): No tonsillar, no preauricular, no posterior auricular and no occipital adenopathy present.       Head (left side): No tonsillar, no preauricular, no posterior auricular and no occipital adenopathy present.    She has no cervical adenopathy.       Right: No  supraclavicular adenopathy present.       Left: No supraclavicular adenopathy present.  Neurological: She is alert and oriented to person, place, and time. No sensory deficit.  Skin: Skin is warm, dry and intact. No rash noted. No cyanosis or erythema. Nails show no clubbing.  Psychiatric: She has a normal mood and affect. Her speech is normal and behavior is normal.    Wt Readings from Last 3 Encounters:  04/08/17 181 lb 3.2 oz (82.2 kg)  02/26/17 181 lb 6.4 oz (82.3 kg)  12/12/16 175 lb 12.8 oz (79.7 kg)       Assessment & Plan:   Problem List Items Addressed This Visit    Hyperlipidemia (Chronic)    Await labs. Interpret results as non-fasting. Adjust regimen as indicated by results.       Relevant Orders   Comprehensive metabolic panel (Completed)   Lipid panel (Completed)   HTN (hypertension) - Primary    Controlled. Continue current regimen.      Relevant Orders   Comprehensive metabolic panel (Completed)   CBC with Differential/Platelet (Completed)   BMI 30.0-30.9,adult    Encouraged healthy eating and water exercise.       Other Visit Diagnoses    Muscle cramping       Oral hydration. Warm compresses. Stretching.   Relevant Orders   Comprehensive metabolic panel (Completed)   POCT urinalysis dipstick (Completed)       Return in about 6 months (around 10/09/2017), or if symptoms worsen or fail to improve, for re-evalaution of blood pressure and cholesterol.Fara Chute, PA-C [facility] [place]

## 2017-04-08 NOTE — Patient Instructions (Addendum)
Take EITHER the celecoxib (twice a day) OR the ibuprofen (three times a day) every day with food.  Try to drink 64 ounces of water every day.  Apply a warm compress or heating pad to the area for 15-20 minutes 2-3 times a day to help relax the muscle.  Consider joining a pool for water exercise.    IF you received an x-ray today, you will receive an invoice from [facility]. Please contact [facility] at [phone] with questions or concerns regarding your invoice.   IF you received labwork today, you will receive an invoice from [place]. Please contact [facility] at [phone] with questions or concerns regarding your invoice.   Our billing staff will not be able to assist you with questions regarding bills from these companies.  You will be contacted with the lab results as soon as they are available. The fastest way to get your results is to activate your My Chart account. Instructions are located on the last page of this paperwork. If you have not heard from [place] regarding the results in 2 weeks, please contact this office.

## 2017-04-09 LAB — CBC WITH DIFFERENTIAL/PLATELET
Basophils Absolute: 0 10*3/uL (ref 0.0–0.2)
Basos: 0 %
EOS (ABSOLUTE): 0.2 10*3/uL (ref 0.0–0.4)
Eos: 2 %
HEMATOCRIT: 42 % (ref 34.0–46.6)
Hemoglobin: 14 g/dL (ref 11.1–15.9)
IMMATURE GRANS (ABS): 0 10*3/uL (ref 0.0–0.1)
IMMATURE GRANULOCYTES: 0 %
LYMPHS: 28 %
Lymphocytes Absolute: 2.4 10*3/uL (ref 0.7–3.1)
MCH: 30.7 pg (ref 26.6–33.0)
MCHC: 33.3 g/dL (ref 31.5–35.7)
MCV: 92 fL (ref 79–97)
MONOCYTES: 5 %
Monocytes Absolute: 0.5 10*3/uL (ref 0.1–0.9)
NEUTROS ABS: 5.7 10*3/uL (ref 1.4–7.0)
NEUTROS PCT: 65 %
Platelets: 317 10*3/uL (ref 150–379)
RBC: 4.56 x10E6/uL (ref 3.77–5.28)
RDW: 14.2 % (ref 12.3–15.4)
WBC: 8.7 10*3/uL (ref 3.4–10.8)

## 2017-04-09 LAB — LIPID PANEL
CHOL/HDL RATIO: 3.4 ratio (ref 0.0–4.4)
Cholesterol, Total: 178 mg/dL (ref 100–199)
HDL: 52 mg/dL (ref >39)
LDL CALC: 106 mg/dL — AB (ref 0–99)
Triglycerides: 102 mg/dL (ref 0–149)
VLDL CHOLESTEROL CAL: 20 mg/dL (ref 5–40)

## 2017-04-09 LAB — COMPREHENSIVE METABOLIC PANEL
A/G RATIO: 1.4 (ref 1.2–2.2)
ALT: 25 IU/L (ref 0–32)
AST: 26 IU/L (ref 0–40)
Albumin: 4.1 g/dL (ref 3.5–5.5)
Alkaline Phosphatase: 106 IU/L (ref 39–117)
BILIRUBIN TOTAL: 0.3 mg/dL (ref 0.0–1.2)
BUN/Creatinine Ratio: 25 — ABNORMAL HIGH (ref 9–23)
BUN: 17 mg/dL (ref 6–24)
CALCIUM: 9.4 mg/dL (ref 8.7–10.2)
CHLORIDE: 101 mmol/L (ref 96–106)
CO2: 28 mmol/L (ref 18–29)
Creatinine, Ser: 0.67 mg/dL (ref 0.57–1.00)
GFR, EST AFRICAN AMERICAN: 115 mL/min/{1.73_m2} (ref >59)
GFR, EST NON AFRICAN AMERICAN: 100 mL/min/{1.73_m2} (ref >59)
GLUCOSE: 78 mg/dL (ref 65–99)
Globulin, Total: 2.9 g/dL (ref 1.5–4.5)
POTASSIUM: 4.1 mmol/L (ref 3.5–5.2)
Sodium: 143 mmol/L (ref 134–144)
TOTAL PROTEIN: 7 g/dL (ref 6.0–8.5)

## 2017-04-11 NOTE — Assessment & Plan Note (Signed)
Encouraged healthy eating and water exercise.

## 2017-04-11 NOTE — Assessment & Plan Note (Signed)
Await labs. Interpret results as non-fasting. Adjust regimen as indicated by results.

## 2017-04-11 NOTE — Assessment & Plan Note (Signed)
Controlled. Continue current regimen. 

## 2017-05-20 DIAGNOSIS — H25812 Combined forms of age-related cataract, left eye: Secondary | ICD-10-CM | POA: Diagnosis not present

## 2017-05-22 ENCOUNTER — Ambulatory Visit: Payer: BLUE CROSS/BLUE SHIELD | Admitting: Podiatry

## 2017-08-06 DIAGNOSIS — H2512 Age-related nuclear cataract, left eye: Secondary | ICD-10-CM | POA: Diagnosis not present

## 2017-08-06 DIAGNOSIS — H25812 Combined forms of age-related cataract, left eye: Secondary | ICD-10-CM | POA: Diagnosis not present

## 2017-08-06 DIAGNOSIS — H2513 Age-related nuclear cataract, bilateral: Secondary | ICD-10-CM | POA: Diagnosis not present

## 2017-08-26 DIAGNOSIS — N200 Calculus of kidney: Secondary | ICD-10-CM | POA: Diagnosis not present

## 2017-09-08 DIAGNOSIS — J302 Other seasonal allergic rhinitis: Secondary | ICD-10-CM | POA: Diagnosis not present

## 2017-09-08 DIAGNOSIS — H9201 Otalgia, right ear: Secondary | ICD-10-CM | POA: Diagnosis not present

## 2017-09-08 DIAGNOSIS — M26621 Arthralgia of right temporomandibular joint: Secondary | ICD-10-CM | POA: Diagnosis not present

## 2017-09-08 DIAGNOSIS — K08409 Partial loss of teeth, unspecified cause, unspecified class: Secondary | ICD-10-CM | POA: Diagnosis not present

## 2017-10-02 DIAGNOSIS — H2513 Age-related nuclear cataract, bilateral: Secondary | ICD-10-CM | POA: Diagnosis not present

## 2017-10-02 DIAGNOSIS — H25811 Combined forms of age-related cataract, right eye: Secondary | ICD-10-CM | POA: Diagnosis not present

## 2017-10-02 DIAGNOSIS — H2511 Age-related nuclear cataract, right eye: Secondary | ICD-10-CM | POA: Diagnosis not present

## 2017-10-10 ENCOUNTER — Ambulatory Visit: Payer: BLUE CROSS/BLUE SHIELD | Admitting: Physician Assistant

## 2017-10-14 ENCOUNTER — Ambulatory Visit: Payer: BLUE CROSS/BLUE SHIELD | Admitting: Physician Assistant

## 2017-10-21 ENCOUNTER — Other Ambulatory Visit: Payer: Self-pay

## 2017-10-21 ENCOUNTER — Ambulatory Visit (INDEPENDENT_AMBULATORY_CARE_PROVIDER_SITE_OTHER): Payer: BLUE CROSS/BLUE SHIELD | Admitting: Physician Assistant

## 2017-10-21 ENCOUNTER — Encounter: Payer: Self-pay | Admitting: Physician Assistant

## 2017-10-21 VITALS — BP 130/82 | HR 87 | Temp 97.8°F | Resp 16 | Ht 66.93 in | Wt 179.0 lb

## 2017-10-21 DIAGNOSIS — E785 Hyperlipidemia, unspecified: Secondary | ICD-10-CM

## 2017-10-21 DIAGNOSIS — Z23 Encounter for immunization: Secondary | ICD-10-CM

## 2017-10-21 DIAGNOSIS — N3946 Mixed incontinence: Secondary | ICD-10-CM | POA: Diagnosis not present

## 2017-10-21 DIAGNOSIS — I10 Essential (primary) hypertension: Secondary | ICD-10-CM | POA: Diagnosis not present

## 2017-10-21 MED ORDER — BENZONATATE 100 MG PO CAPS: 100.0000 mg | ORAL_CAPSULE | Freq: Three times a day (TID) | ORAL | 0 refills | Status: DC | PRN

## 2017-10-21 MED ORDER — LISINOPRIL 40 MG PO TABS: 40.0000 mg | ORAL_TABLET | Freq: Every day | ORAL | 3 refills | Status: DC

## 2017-10-21 NOTE — Assessment & Plan Note (Signed)
Cholesterol much improved when checked in May. Fasting labs today.

## 2017-10-21 NOTE — Progress Notes (Signed)
Patient ID: Hannah Nguyen, female    DOB: November 18, 1961, 56 y.o.   MRN: [id number]  PCP: Harrison Mons, PA-C  Chief Complaint  Patient presents with  . Hypertension    6 month follow-up   . Hyperlipidemia    Subjective:   Presents for evaluation of HTN and hyperlipidemia. She is fasting today.  Tolerating her medications well. Denies adverse effects.  Stress and urge incontinence. Previous treatment caused her to "hold my urine too long and made my stomach hurt." Prescribed by Dr. Janice Norrie. Requests bezonatate to use if she develops cough this winter, which results in increased incontinence.  Review of Systems  Constitutional: Negative.   HENT: Negative for sore throat.   Eyes: Negative for visual disturbance.  Respiratory: Negative for cough, chest tightness, shortness of breath and wheezing.   Cardiovascular: Negative for chest pain and palpitations.  Gastrointestinal: Negative for abdominal pain, diarrhea, nausea and vomiting.  Genitourinary: Positive for frequency and urgency. Negative for dysuria and hematuria.  Musculoskeletal: Negative for arthralgias and myalgias.  Skin: Negative for rash.  Neurological: Negative for dizziness, weakness and headaches.  Psychiatric/Behavioral: Negative for decreased concentration. The patient is not nervous/anxious.        Patient Active Problem List   Diagnosis Date Noted  . Nephrolithiasis 10/29/2016  . Spinal stenosis of lumbar region 03/07/2015  . BMI 30.0-30.9,adult 03/07/2015  . HTN (hypertension) 01/31/2014  . Hyperlipidemia 01/31/2014  . Migraine   . Partial seizure ([place]) 08/17/2012     Prior to Admission medications   Medication Sig Start Date End Date Taking? Authorizing Provider  atorvastatin (LIPITOR) 20 MG tablet Take 1 tablet (20 mg total) by mouth daily after supper. 10/29/16  Yes Zarai Orsborn, PA-C  Cetirizine HCl (ZYRTEC ALLERGY PO) Take by mouth.   Yes [provider]  IBUPROFEN PO  Take by mouth as needed.   Yes [provider]  levETIRAcetam (KEPPRA) 500 MG tablet 250mg  in am, 500mg  in  pm. 12/03/16  Yes Dennie Bible, NP  lisinopril-hydrochlorothiazide (PRINZIDE,ZESTORETIC) 20-25 MG tablet Take 1 tablet by mouth daily. 10/29/16  Yes Akyra Bouchie, PA-C  Multiple Vitamins-Minerals (MULTIVITAMINS THER. W/MINERALS) TABS Take 1 tablet by mouth daily.     Yes [provider]  ofloxacin (OCUFLOX) 0.3 % ophthalmic solution  09/29/17  Yes [provider]  [place] life: magnesium, potassium, aspartate 600mg  tabs Taking one daily for muscle cramps   Yes [provider]  prednisoLONE acetate (PRED FORTE) 1 % ophthalmic suspension  09/29/17  Yes [provider]     Allergies  Allergen Reactions  . Vicodin [Hydrocodone-Acetaminophen] Hives and Itching       Objective:  Physical Exam  Constitutional: She is oriented to person, place, and time. She appears well-developed and well-nourished. She is active and cooperative. No distress.  BP 130/82   Pulse 87   Temp 97.8 F (36.6 C) (Oral)   Resp 16   Ht 5' 6.93" (1.7 m)   Wt 179 lb (81.2 kg)   LMP 09/22/2011   SpO2 99%   BMI 28.09 kg/m   HENT:  Head: Normocephalic and atraumatic.  Right Ear: Hearing normal.  Left Ear: Hearing normal.  Eyes: Conjunctivae are normal. No scleral icterus.  Neck: Normal range of motion. Neck supple. No thyromegaly present.  Cardiovascular: Normal rate, regular rhythm and normal heart sounds.  Pulses:      Radial pulses are 2+ on the right side, and 2+ on the  left side.  Pulmonary/Chest: Effort normal and breath sounds normal.  Lymphadenopathy:       Head (right side): No tonsillar, no preauricular, no posterior auricular and no occipital adenopathy present.       Head (left side): No tonsillar, no preauricular, no posterior auricular and no occipital adenopathy present.    She has no cervical adenopathy.        Right: No supraclavicular adenopathy present.       Left: No supraclavicular adenopathy present.  Neurological: She is alert and oriented to person, place, and time. No sensory deficit.  Skin: Skin is warm, dry and intact. No rash noted. No cyanosis or erythema. Nails show no clubbing.  Psychiatric: She has a normal mood and affect. Her speech is normal and behavior is normal.      Assessment & Plan:   Problem List Items Addressed This Visit    Hyperlipidemia (Chronic)    Cholesterol much improved when checked in May. Fasting labs today.      Relevant Medications   lisinopril (PRINIVIL,ZESTRIL) 40 MG tablet   Other Relevant Orders   Lipid panel   HTN (hypertension) - Primary    Controlled. Due to urinary incontinence, STOP lisinopril-HCTZ and start lisinopril 40 mg daily.      Relevant Medications   lisinopril (PRINIVIL,ZESTRIL) 40 MG tablet   Other Relevant Orders   Comprehensive metabolic panel   CBC with Differential/Platelet   Mixed incontinence    Previous medication tried caused urinary retention and abdominal pain. Eliminate diuretic from antihypertensive.      Relevant Medications   benzonatate (TESSALON) 100 MG capsule    Other Visit Diagnoses    Need for influenza vaccination       Relevant Orders   Flu Vaccine QUAD 36+ mos IM       Return in about 3 months (around 01/19/2018) for re-evalaution of blood pressure, urine leakage.   Fara Chute, PA-C [facility] [place]

## 2017-10-21 NOTE — Patient Instructions (Addendum)
  STOP the lisinopril-HCTZ. START the lisinopril 40 mg. Let me know how that helps with the urine leakage!   IF you received an x-ray today, you will receive an invoice from [facility]. Please contact [facility] at [phone] with questions or concerns regarding your invoice.   IF you received labwork today, you will receive an invoice from [place]. Please contact [facility] at [phone] with questions or concerns regarding your invoice.   Our billing staff will not be able to assist you with questions regarding bills from these companies.  You will be contacted with the lab results as soon as they are available. The fastest way to get your results is to activate your My Chart account. Instructions are located on the last page of this paperwork. If you have not heard from [place] regarding the results in 2 weeks, please contact this office.    \

## 2017-10-21 NOTE — Assessment & Plan Note (Signed)
Controlled. Due to urinary incontinence, STOP lisinopril-HCTZ and start lisinopril 40 mg daily.

## 2017-10-21 NOTE — Assessment & Plan Note (Signed)
Previous medication tried caused urinary retention and abdominal pain. Eliminate diuretic from antihypertensive.

## 2017-10-22 LAB — CBC WITH DIFFERENTIAL/PLATELET
BASOS: 0 %
Basophils Absolute: 0 10*3/uL (ref 0.0–0.2)
EOS (ABSOLUTE): 0.2 10*3/uL (ref 0.0–0.4)
Eos: 2 %
HEMATOCRIT: 41.4 % (ref 34.0–46.6)
HEMOGLOBIN: 14.2 g/dL (ref 11.1–15.9)
IMMATURE GRANS (ABS): 0 10*3/uL (ref 0.0–0.1)
Immature Granulocytes: 0 %
LYMPHS ABS: 2.6 10*3/uL (ref 0.7–3.1)
LYMPHS: 34 %
MCH: 32.3 pg (ref 26.6–33.0)
MCHC: 34.3 g/dL (ref 31.5–35.7)
MCV: 94 fL (ref 79–97)
MONOCYTES: 6 %
Monocytes Absolute: 0.4 10*3/uL (ref 0.1–0.9)
NEUTROS ABS: 4.4 10*3/uL (ref 1.4–7.0)
Neutrophils: 58 %
Platelets: 328 10*3/uL (ref 150–379)
RBC: 4.39 x10E6/uL (ref 3.77–5.28)
RDW: 13.8 % (ref 12.3–15.4)
WBC: 7.6 10*3/uL (ref 3.4–10.8)

## 2017-10-22 LAB — COMPREHENSIVE METABOLIC PANEL
A/G RATIO: 1.6 (ref 1.2–2.2)
ALBUMIN: 4.4 g/dL (ref 3.5–5.5)
ALT: 22 IU/L (ref 0–32)
AST: 25 IU/L (ref 0–40)
Alkaline Phosphatase: 102 IU/L (ref 39–117)
BILIRUBIN TOTAL: 0.4 mg/dL (ref 0.0–1.2)
BUN / CREAT RATIO: 22 (ref 9–23)
BUN: 18 mg/dL (ref 6–24)
CALCIUM: 9.9 mg/dL (ref 8.7–10.2)
CO2: 28 mmol/L (ref 20–29)
Chloride: 102 mmol/L (ref 96–106)
Creatinine, Ser: 0.81 mg/dL (ref 0.57–1.00)
GFR calc non Af Amer: 83 mL/min/{1.73_m2} (ref >59)
GFR, EST AFRICAN AMERICAN: 95 mL/min/{1.73_m2} (ref >59)
GLUCOSE: 86 mg/dL (ref 65–99)
Globulin, Total: 2.7 g/dL (ref 1.5–4.5)
Potassium: 4.1 mmol/L (ref 3.5–5.2)
Sodium: 143 mmol/L (ref 134–144)
TOTAL PROTEIN: 7.1 g/dL (ref 6.0–8.5)

## 2017-10-22 LAB — LIPID PANEL
CHOL/HDL RATIO: 3.9 ratio (ref 0.0–4.4)
CHOLESTEROL TOTAL: 179 mg/dL (ref 100–199)
HDL: 46 mg/dL (ref >39)
LDL CALC: 114 mg/dL — AB (ref 0–99)
Triglycerides: 93 mg/dL (ref 0–149)
VLDL CHOLESTEROL CAL: 19 mg/dL (ref 5–40)

## 2017-10-29 ENCOUNTER — Other Ambulatory Visit: Payer: Self-pay | Admitting: Physician Assistant

## 2017-10-29 DIAGNOSIS — E785 Hyperlipidemia, unspecified: Secondary | ICD-10-CM

## 2017-10-31 ENCOUNTER — Ambulatory Visit: Payer: BLUE CROSS/BLUE SHIELD | Admitting: Physician Assistant

## 2017-12-04 ENCOUNTER — Ambulatory Visit: Payer: BLUE CROSS/BLUE SHIELD | Admitting: Nurse Practitioner

## 2017-12-24 ENCOUNTER — Other Ambulatory Visit: Payer: Self-pay

## 2017-12-24 ENCOUNTER — Encounter: Payer: Self-pay | Admitting: Physician Assistant

## 2017-12-24 ENCOUNTER — Ambulatory Visit: Payer: BLUE CROSS/BLUE SHIELD | Admitting: Physician Assistant

## 2017-12-24 VITALS — BP 140/80 | HR 90 | Temp 99.8°F | Resp 16 | Ht 62.25 in | Wt 181.6 lb

## 2017-12-24 DIAGNOSIS — J329 Chronic sinusitis, unspecified: Secondary | ICD-10-CM

## 2017-12-24 DIAGNOSIS — M79644 Pain in right finger(s): Secondary | ICD-10-CM | POA: Diagnosis not present

## 2017-12-24 DIAGNOSIS — J310 Chronic rhinitis: Secondary | ICD-10-CM

## 2017-12-24 MED ORDER — GUAIFENESIN ER 1200 MG PO TB12: 1.0000 | ORAL_TABLET | Freq: Two times a day (BID) | ORAL | 1 refills | Status: DC | PRN

## 2017-12-24 MED ORDER — IPRATROPIUM BROMIDE 0.03 % NA SOLN: 2.0000 | Freq: Two times a day (BID) | NASAL | 0 refills | Status: DC

## 2017-12-24 MED ORDER — DICLOFENAC SODIUM 1 % TD GEL
2.0000 g | Freq: Four times a day (QID) | TRANSDERMAL | 0 refills | Status: DC
Start: 2017-12-24 — End: 2018-04-17

## 2017-12-24 NOTE — Progress Notes (Signed)
[facility] AT [place], [place] [address] [phone]  Date:  12/24/2017   Name:  Hannah Nguyen   DOB:  [birth year]   MRN:  [id number]  PCP:  Harrison Mons, PA-C    History of Present Illness:  Hannah Nguyen is a 57 y.o. female patient who presents to PCP with  Chief Complaint  Patient presents with  . Cough    nonproductive x 2 days  . Nasal Congestion  . finger pain    RIGHT 4th finger-per patient for a minute, add on before leaving room    2 days of nasal congestion, coughing,  Runny nose when around heat.  Scratchy throat.  Not hydrating well.  She is drinking water and gatorade.  No ear discomfort.  No sob or dyspnea.    Right finger pain and cramping.  Stiff at times, and takes time to unlock.  She does note swelling at times.  She has done nothing for this.  This has been present for several months.   Patient Active Problem List   Diagnosis Date Noted  . Mixed incontinence 10/21/2017  . Nephrolithiasis 10/29/2016  . Spinal stenosis of lumbar region 03/07/2015  . BMI 30.0-30.9,adult 03/07/2015  . HTN (hypertension) 01/31/2014  . Hyperlipidemia 01/31/2014  . Migraine   . Partial seizure ([place]) 08/17/2012    Past Medical History:  Diagnosis Date  . Bartholin's gland cyst 10/22/2011  . Elevated cholesterol   . GERD (gastroesophageal reflux disease)   . Hypertension   . Kidney infection 05/2015  . Leiomyoma of uterus 10/22/2011  . Migraine   . Nephrolithiasis   . Other and unspecified hyperlipidemia   . Seizures ([place])    2 months ago was last episode of uncontrollable rt arm motions and passed out  . Spinal stenosis 11/2014   L4-L5, L leg pain    Past Surgical History:  Procedure Laterality Date  . ABDOMINAL HYSTERECTOMY    . CESAREAN SECTION     x2  . EYE SURGERY     both eyes   . KNEE ARTHROSCOPY     x2  . LITHOTRIPSY    . ROBOTIC ASSISTED LAP VAGINAL HYSTERECTOMY  10/25/2011   Procedure: ROBOTIC ASSISTED LAPAROSCOPIC VAGINAL  HYSTERECTOMY;  Surgeon: Agnes Lawrence, MD;  Location: [facility];  Service: Gynecology;  Laterality: N/A;  Marsupralization og bartholin gland, cyst biopsy  . thumb surg      Social History   Tobacco Use  . Smoking status: Never Smoker  . Smokeless tobacco: Never Used  Substance Use Topics  . Alcohol use: No    Alcohol/week: 0.0 oz  . Drug use: No    Family History  Problem Relation Age of Onset  . Diabetes Mother   . Hyperlipidemia Mother   . Hypertension Mother   . Breast cancer Mother 104       s/p bilateral mastectomy  . Diabetes Father   . Hyperlipidemia Father   . Hypertension Father   . Hypertension Sister   . Bipolar disorder Daughter   . Asthma Daughter   . Diabetes Daughter   . Bipolar disorder Son     Allergies  Allergen Reactions  . Vicodin [Hydrocodone-Acetaminophen] Hives and Itching    Medication list has been reviewed and updated.  Current Outpatient Medications on File Prior to Visit  Medication Sig Dispense Refill  . atorvastatin (LIPITOR) 20 MG tablet TAKE 1 TABLET(20 MG) BY MOUTH DAILY AFTER SUPPER 60 tablet 0  . benzonatate (  TESSALON) 100 MG capsule Take 1-2 capsules (100-200 mg total) by mouth 3 (three) times daily as needed for cough. 40 capsule 0  . Cetirizine HCl (ZYRTEC ALLERGY PO) Take by mouth.    . IBUPROFEN PO Take by mouth as needed.    . levETIRAcetam (KEPPRA) 500 MG tablet 250mg  in am, 500mg  in  pm. 135 tablet 3  . lisinopril-hydrochlorothiazide (PRINZIDE,ZESTORETIC) 20-12.5 MG tablet Take 1 tablet by mouth daily.    . Multiple Vitamins-Minerals (MULTIVITAMINS THER. W/MINERALS) TABS Take 1 tablet by mouth daily.      Marland Kitchen ofloxacin (OCUFLOX) 0.3 % ophthalmic solution   0  . lisinopril (PRINIVIL,ZESTRIL) 40 MG tablet Take 1 tablet (40 mg total) by mouth daily. (Patient not taking: Reported on 12/24/2017) 90 tablet 3  . OVER THE COUNTER MEDICATION [place] life: magnesium, potassium, aspartate 600mg  tabs Taking one daily for muscle cramps     . prednisoLONE acetate (PRED FORTE) 1 % ophthalmic suspension   0   No current facility-administered medications on file prior to visit.     ROS ROS otherwise unremarkable unless listed above.  Physical Examination: BP 140/80 (BP Location: Right Arm, Patient Position: Sitting, Cuff Size: Large)   Pulse 90   Temp 99.8 F (37.7 C) (Oral)   Resp 16   Ht 5' 2.25" (1.581 m)   Wt 181 lb 9.6 oz (82.4 kg)   LMP 09/22/2011   SpO2 98%   BMI 32.95 kg/m  Ideal Body Weight: Weight in (lb) to have BMI = 25: 137.5  Physical Exam  Constitutional: She is oriented to person, place, and time. She appears well-developed and well-nourished. No distress.  HENT:  Head: Normocephalic and atraumatic.  Right Ear: Tympanic membrane, external ear and ear canal normal.  Left Ear: Tympanic membrane, external ear and ear canal normal.  Nose: Mucosal edema and rhinorrhea present. Right sinus exhibits no maxillary sinus tenderness and no frontal sinus tenderness. Left sinus exhibits no maxillary sinus tenderness and no frontal sinus tenderness.  Mouth/Throat: No uvula swelling. No oropharyngeal exudate, posterior oropharyngeal edema or posterior oropharyngeal erythema.  Eyes: Conjunctivae and EOM are normal. Pupils are equal, round, and reactive to light.  Cardiovascular: Normal rate and regular rhythm. Exam reveals no gallop, no distant heart sounds and no friction rub.  No murmur heard. Pulmonary/Chest: Effort normal. No respiratory distress. She has no decreased breath sounds. She has no wheezes. She has no rhonchi.  Musculoskeletal:  4th finger with mild swelling and rom limtited with flexion however mo.vement is fluid without locking No tenderness at the pip or dip.    Lymphadenopathy:       Head (right side): No submandibular, no tonsillar, no preauricular and no posterior auricular adenopathy present.       Head (left side): No submandibular, no tonsillar, no preauricular and no posterior auricular  adenopathy present.  Neurological: She is alert and oriented to person, place, and time.  Skin: She is not diaphoretic.  Psychiatric: She has a normal mood and affect. Her behavior is normal.     Assessment and Plan: Hannah Nguyen is a 57 y.o. female who is here today for cc of  Chief Complaint  Patient presents with  . Cough    nonproductive x 2 days  . Nasal Congestion  . finger pain    RIGHT 4th finger-per patient for a minute, add on before leaving room  advised like viral UR illness.  Treating supportively at this time This appears probable trigger finger in hx.  Advised likely may need a localized steroid injection.  Patient would rather manage less invasive  We will attempt the voltaren gel at this time.  Rhinosinusitis - Plan: Guaifenesin (MUCINEX MAXIMUM STRENGTH) 1200 MG TB12, ipratropium (ATROVENT) 0.03 % nasal spray  Pain of finger of right hand - Plan: diclofenac sodium (VOLTAREN) 1 % GEL  Ivar Drape, PA-C [facility] 2/22/20198:49 AM

## 2017-12-24 NOTE — Patient Instructions (Addendum)
Make sure you are hydrating well with WATER.  At least 64 oz of water per day You can take ibuofen, for example 600mg  every 8 hours as needed for pain.  Take with food.  Do not take consistently longer than 7 days. I would also like you to use a humidifier and over the counter nasal saline spray.  You can ice the finger.  Try the voltaren cream.  Make sure this does not get into your food or mouth.  If this worsens, we will need to image. Sinusitis, Adult Sinusitis is soreness and inflammation of your sinuses. Sinuses are hollow spaces in the bones around your face. They are located:  Around your eyes.  In the middle of your forehead.  Behind your nose.  In your cheekbones.  Your sinuses and nasal passages are lined with a stringy fluid (mucus). Mucus normally drains out of your sinuses. When your nasal tissues get inflamed or swollen, the mucus can get trapped or blocked so air cannot flow through your sinuses. This lets bacteria, viruses, and funguses grow, and that leads to infection. Follow these instructions at home: Medicines  Take, use, or apply over-the-counter and prescription medicines only as told by your doctor. These may include nasal sprays.  If you were prescribed an antibiotic medicine, take it as told by your doctor. Do not stop taking the antibiotic even if you start to feel better. Hydrate and Humidify  Drink enough water to keep your pee (urine) clear or pale yellow.  Use a cool mist humidifier to keep the humidity level in your home above 50%.  Breathe in steam for 10-15 minutes, 3-4 times a day or as told by your doctor. You can do this in the bathroom while a hot shower is running.  Try not to spend time in cool or dry air. Rest  Rest as much as possible.  Sleep with your head raised (elevated).  Make sure to get enough sleep each night. General instructions  Put a warm, moist washcloth on your face 3-4 times a day or as told by your doctor. This will  help with discomfort.  Wash your hands often with soap and water. If there is no soap and water, use hand sanitizer.  Do not smoke. Avoid being around people who are smoking (secondhand smoke).  Keep all follow-up visits as told by your doctor. This is important. Contact a doctor if:  You have a fever.  Your symptoms get worse.  Your symptoms do not get better within 10 days. Get help right away if:  You have a very bad headache.  You cannot stop throwing up (vomiting).  You have pain or swelling around your face or eyes.  You have trouble seeing.  You feel confused.  Your neck is stiff.  You have trouble breathing. This information is not intended to replace advice given to you by your health care provider. Make sure you discuss any questions you have with your health care provider. Document Released: 04/22/2008 Document Revised: 06/30/2016 Document Reviewed: 08/30/2015 Elsevier Interactive Patient Education  2018 [employer].    IF you received an x-ray today, you will receive an invoice from [facility]. Please contact [facility] at [phone] with questions or concerns regarding your invoice.   IF you received labwork today, you will receive an invoice from [place]. Please contact [facility] at [phone] with questions or concerns regarding your invoice.   Our billing staff will not be able to assist you with questions regarding  bills from these companies.  You will be contacted with the lab results as soon as they are available. The fastest way to get your results is to activate your My Chart account. Instructions are located on the last page of this paperwork. If you have not heard from [place] regarding the results in 2 weeks, please contact this office.

## 2017-12-26 ENCOUNTER — Other Ambulatory Visit: Payer: Self-pay | Admitting: Physician Assistant

## 2017-12-26 DIAGNOSIS — E785 Hyperlipidemia, unspecified: Secondary | ICD-10-CM

## 2017-12-26 DIAGNOSIS — I10 Essential (primary) hypertension: Secondary | ICD-10-CM

## 2017-12-29 NOTE — Telephone Encounter (Signed)
Lisinopril -HCTZ has been discontinued.  Atorvastatin 20 mg tab refill Last OV: 12/24/17 Last Refill:10/29/17 Pharmacy:[facility] [address] [place]

## 2018-01-07 ENCOUNTER — Telehealth: Payer: Self-pay | Admitting: *Deleted

## 2018-01-07 NOTE — Telephone Encounter (Signed)
Pa started  KEY: [id number]

## 2018-01-09 ENCOUNTER — Encounter: Payer: Self-pay | Admitting: Physician Assistant

## 2018-01-13 ENCOUNTER — Ambulatory Visit: Payer: BLUE CROSS/BLUE SHIELD | Admitting: Nurse Practitioner

## 2018-01-20 NOTE — Progress Notes (Signed)
[facility]  PATIENT: Hannah Nguyen DOB: [birth year]   REASON FOR VISIT: Follow-up for seizure disorder, migraines HISTORY FROM: Patient    HISTORY OF PRESENT ILLNESS:Hannah Nguyen is a 57year old female with a history of migraines and seizure disorder. She returns today for yearly follow-up  She is currently taking Keppra 750mg  daily- 500 mg in the PM and 250 mg in the AM. She reports that her last seizure was almost 5.5 years ago. She operates a Teacher, music without difficulty. At home she can complete all ADLs independently. No new neurological complaints.  She recently had bilateral cataracts removed.  She continues to work two part-time jobs. She tries to be active.  She returns for reevaluation.  She needs refills on her Keppra.   History 03/16/14 (CM): 57 year old female returns for followup. She has a history of seizure disorder. She was last seen by Dr. Krista Blue 10/07/2013 ,she was placed on extended release Keppra and the patient claims she had side effects to the medication. She called into the office and did not get a response therefore she went back to taking her 500 mg of Keppra at night only. No seizure events in a couple of years. Appetite is reportedly good, she does have some difficulty sleeping at times due to muscle pain in her left thigh. She is due to see a orthopedist. She had a recent CBC and CMP by primary care which was reviewed. She was made aware she's taking suboptimal dose of Keppra. She returns for reevaluation. HISTORY (YY): She has past medical history of hypertension, hyperlipidemia, migraine headaches, presenting with passing out episode in January 29, 2010.  she was with her family at home, after watching TV for a while in January 29 2010, she decided to fix something for her child, she remember walking to the kitchen, the next thing, she woke up on the floor, EMS were there, all rounding her, she apparently had a passout, there was no evidence  she had hurt herself, no evidence of trauma, urinary incontinence, there was no tongue biting. Family started trying to arouse her by rubbing her hands, shaking her, 3-5 minutes before she was able to be aroused completely, but that time EMS has arrived.  She was admitted to hospital on January 29 2010, during her first episode, she was started on Keppra 500 mg twice a day, but she is not taking it regularly. Echocardiogram showed ejection fraction 60-65%, EKG and telemetry monitoring was normal, CT scan of the head was normal.  Since the initial episode, she had episode of uncontrollable right arm shaking, lasting few minutes, without loss of consciousness, she often feel hot, with elevated blood pressure during the episode, this can happen 3 times or evenl more times in a week.  Since Keppra was started, she has much less recurrent episode, and denies any since last seen 08/07/11. Her episode in the past described as stereotypical intermittent right arm uncontrollable shaking, lasting 2-3 minutes, not under her voluntary controlled, no associated right face or right lower extremity shaking. Keppra make her drowsy. EEG showed intermittent right central slowing, occasionally right P4 sharp transient. MRI of the brain has demonstrated supratentorial small vessel disease,atrophy more than expected for her age.     REVIEW OF SYSTEMS: Full 14 system review of systems performed and notable only for those listed, all others are neg:  Constitutional: neg  Cardiovascular: neg Ear/Nose/Throat: neg  Skin: neg Eyes: Light sensitivity Respiratory: neg Gastroitestinal: neg  Hematology/Lymphatic: neg  Endocrine: neg Musculoskeletal: Aching muscles Allergy/Immunology: neg Neurological: History of seizure disorder and headaches Psychiatric: neg Sleep : neg   ALLERGIES: Allergies  Allergen Reactions  . Vicodin [Hydrocodone-Acetaminophen] Hives and Itching    HOME MEDICATIONS: Outpatient Medications  Prior to Visit  Medication Sig Dispense Refill  . atorvastatin (LIPITOR) 20 MG tablet TAKE 1 TABLET(20 MG) BY MOUTH DAILY AFTER SUPPER 60 tablet 0  . benzonatate (TESSALON) 100 MG capsule Take 1-2 capsules (100-200 mg total) by mouth 3 (three) times daily as needed for cough. 40 capsule 0  . Cetirizine HCl (ZYRTEC ALLERGY PO) Take by mouth.    . IBUPROFEN PO Take by mouth as needed.    Marland Kitchen ipratropium (ATROVENT) 0.03 % nasal spray Place 2 sprays into both nostrils 2 (two) times daily. 30 mL 0  . levETIRAcetam (KEPPRA) 500 MG tablet 250mg  in am, 500mg  in  pm. 135 tablet 3  . lisinopril (PRINIVIL,ZESTRIL) 40 MG tablet Take 1 tablet (40 mg total) by mouth daily. 90 tablet 3  . lisinopril-hydrochlorothiazide (PRINZIDE,ZESTORETIC) 20-12.5 MG tablet Take 1 tablet by mouth daily.    . Multiple Vitamins-Minerals (MULTIVITAMINS THER. W/MINERALS) TABS Take 1 tablet by mouth daily.      Marland Kitchen ofloxacin (OCUFLOX) 0.3 % ophthalmic solution   0  . prednisoLONE acetate (PRED FORTE) 1 % ophthalmic suspension   0  . diclofenac sodium (VOLTAREN) 1 % GEL Apply 2 g topically 4 (four) times daily. (Patient not taking: Reported on 01/21/2018) 100 g 0  . Guaifenesin (MUCINEX MAXIMUM STRENGTH) 1200 MG TB12 Take 1 tablet (1,200 mg total) by mouth every 12 (twelve) hours as needed. (Patient not taking: Reported on 01/21/2018) 14 tablet 1  . OVER THE COUNTER MEDICATION [employer] life: magnesium, potassium, aspartate 600mg  tabs Taking one daily for muscle cramps     No facility-administered medications prior to visit.     PAST MEDICAL HISTORY: Past Medical History:  Diagnosis Date  . Bartholin's gland cyst 10/22/2011  . Elevated cholesterol   . GERD (gastroesophageal reflux disease)   . Hypertension   . Kidney infection 05/2015  . Leiomyoma of uterus 10/22/2011  . Migraine   . Nephrolithiasis   . Other and unspecified hyperlipidemia   . Seizures ([place])    2 months ago was last episode of uncontrollable rt arm motions and  passed out  . Spinal stenosis 11/2014   L4-L5, L leg pain    PAST SURGICAL HISTORY: Past Surgical History:  Procedure Laterality Date  . ABDOMINAL HYSTERECTOMY    . CESAREAN SECTION     x2  . EYE SURGERY     cataracts, both eyes   . KNEE ARTHROSCOPY     x2  . LITHOTRIPSY    . ROBOTIC ASSISTED LAP VAGINAL HYSTERECTOMY  10/25/2011   Procedure: ROBOTIC ASSISTED LAPAROSCOPIC VAGINAL HYSTERECTOMY;  Surgeon: Agnes Lawrence, MD;  Location: [facility];  Service: Gynecology;  Laterality: N/A;  Marsupralization og bartholin gland, cyst biopsy  . thumb surg      FAMILY HISTORY: Family History  Problem Relation Age of Onset  . Diabetes Mother   . Hyperlipidemia Mother   . Hypertension Mother   . Breast cancer Mother 32       s/p bilateral mastectomy  . Diabetes Father   . Hyperlipidemia Father   . Hypertension Father   . Hypertension Sister   . Bipolar disorder Daughter   . Asthma Daughter   . Diabetes Daughter   . Bipolar disorder Son     SOCIAL  HISTORY: Social History   Socioeconomic History  . Marital status: Married    Spouse name: Owens Shark, estranged 07/2016  . Number of children: 2  . Years of education: 64  . Highest education level: Not on file  Social Needs  . Financial resource strain: Not on file  . Food insecurity - worry: Not on file  . Food insecurity - inability: Not on file  . Transportation needs - medical: Not on file  . Transportation needs - non-medical: Not on file  Occupational History  . Occupation: COOK    Employer: [employer]: full time  . 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 Smoking status: Never Smoker  . Smokeless tobacco: Never Used  Substance and Sexual Activity  . Alcohol use: No    Alcohol/week: 0.0 oz  . Drug use: No  . Sexual activity: Yes    Birth control/protection: Surgical  Other Topics Concern  . Not on file  Social History Narrative   Patient lives with her sister  since separating from her husband 07/2016.   Patient works full time.   Right handed.    Caffeine- three sodas daily.      Smoke detectors in home? Yes    Guns in home? No    Consistent seatbelt use? Yes    Consistent dental brushing and flossing? Yes    Semi-Annual dental visits: Yes    Annual Eye exams: Yes      PHYSICAL EXAM  Vitals:   01/21/18 1309  BP: 133/74  Pulse: 64  Weight: 176 lb 6.4 oz (80 kg)   Body mass index is 32.01 kg/m. Generalized: Well developed, obese female in no acute distress  Head: normocephalic and atraumatic,. Oropharynx benign  Musculoskeletal: No deformity   Neurological examination  Mentation: Alert oriented to time, place, history taking. Attention span and concentration appropriate. Recent and remote memory intact. Follows all commands speech and language fluent.  Cranial nerve II-XII: Pupils were equal round reactive to light extraocular movements were full, visual field were full on confrontational test. Facial sensation and strength were normal. hearing was intact to finger rubbing bilaterally. Uvula tongue midline. head turning and shoulder shrug were normal and symmetric.Tongue protrusion into cheek strength was normal. Motor: normal bulk and tone, full strength in the BUE, BLE, fine finger movements normal, no pronator drift. No focal weakness Coordination: finger-nose-finger, heel-to-shin bilaterally, no dysmetria, no tremor Reflexes: Symmetric upper and lower, plantar responses were flexor bilaterally. Gait and Station: Rising up from seated position without assistance, normal stance, moderate stride, good arm swing, smooth turning, able to perform tiptoe, and heel walking without difficulty. Tandem gait is unsteady. No assistive device Romberg negative   DIAGNOSTIC DATA (LABS, IMAGING, TESTING) - I reviewed patient records, labs, notes, testing and imaging myself where available.  Lab Results  Component Value Date   WBC 7.6  10/21/2017   HGB 14.2 10/21/2017   HCT 41.4 10/21/2017   MCV 94 10/21/2017   PLT 328 10/21/2017      Component Value Date/Time   NA 143 10/21/2017 1421   K 4.1 10/21/2017 1421   CL 102 10/21/2017 1421   CO2 28 10/21/2017 1421   GLUCOSE 86 10/21/2017 1421   GLUCOSE 81 03/28/2016 0909   BUN 18 10/21/2017 1421   CREATININE 0.81 10/21/2017 1421   CREATININE 0.73 03/28/2016 0909   CALCIUM 9.9 10/21/2017 1421   PROT 7.1 10/21/2017 1421   ALBUMIN  4.4 10/21/2017 1421   AST 25 10/21/2017 1421   ALT 22 10/21/2017 1421   ALKPHOS 102 10/21/2017 1421   BILITOT 0.4 10/21/2017 1421   GFRNONAA 83 10/21/2017 1421   GFRNONAA >89 12/18/2015 0808   GFRAA 95 10/21/2017 1421   GFRAA >89 12/18/2015 0808   Lab Results  Component Value Date   CHOL 179 10/21/2017   HDL 46 10/21/2017   LDLCALC 114 (H) 10/21/2017   TRIG 93 10/21/2017   CHOLHDL 3.9 10/21/2017    No results found for: VITAMINB12 Lab Results  Component Value Date   TSH 0.785 10/29/2016      ASSESSMENT AND PLAN 57 y.o. year old female has a past medical history of Hypertension; Elevated cholesterol; Seizures; Nephrolithiasis; Migraine; and Spinal stenosis (11/2014). here to follow-up for her seizure disorder and her migraines which are in good control. Last seizure 5.5 years ago.  Continue Keppra at current dose will refill  Call for any seizure activity Continue to be active as much as possible for overall health and well-being Follow-up in 1 year Dennie Bible, [facility], [facility], APRN  [facility] [address] [place], [place] [address] [phone]

## 2018-01-21 ENCOUNTER — Ambulatory Visit: Payer: BLUE CROSS/BLUE SHIELD | Admitting: Nurse Practitioner

## 2018-01-21 ENCOUNTER — Encounter: Payer: Self-pay | Admitting: Nurse Practitioner

## 2018-01-21 VITALS — BP 133/74 | HR 64 | Wt 176.4 lb

## 2018-01-21 DIAGNOSIS — R569 Unspecified convulsions: Secondary | ICD-10-CM | POA: Diagnosis not present

## 2018-01-21 MED ORDER — LEVETIRACETAM 500 MG PO TABS: ORAL_TABLET | ORAL | 3 refills | Status: DC

## 2018-01-21 NOTE — Patient Instructions (Signed)
Continue Keppra at current dose will refill  Call for any seizure activity Continue to be active as much as possible for overall health and well-being Follow-up in 1 year

## 2018-01-23 NOTE — Progress Notes (Signed)
I have reviewed and agreed above plan. 

## 2018-02-02 ENCOUNTER — Other Ambulatory Visit: Payer: Self-pay | Admitting: Physician Assistant

## 2018-02-02 DIAGNOSIS — J329 Chronic sinusitis, unspecified: Secondary | ICD-10-CM

## 2018-02-02 DIAGNOSIS — J310 Chronic rhinitis: Secondary | ICD-10-CM

## 2018-02-05 ENCOUNTER — Telehealth: Payer: Self-pay | Admitting: *Deleted

## 2018-02-05 ENCOUNTER — Ambulatory Visit (INDEPENDENT_AMBULATORY_CARE_PROVIDER_SITE_OTHER): Payer: BLUE CROSS/BLUE SHIELD

## 2018-02-05 ENCOUNTER — Ambulatory Visit: Payer: BLUE CROSS/BLUE SHIELD | Admitting: Podiatry

## 2018-02-05 ENCOUNTER — Encounter: Payer: Self-pay | Admitting: Podiatry

## 2018-02-05 DIAGNOSIS — Q828 Other specified congenital malformations of skin: Secondary | ICD-10-CM

## 2018-02-05 DIAGNOSIS — M722 Plantar fascial fibromatosis: Secondary | ICD-10-CM

## 2018-02-05 NOTE — Telephone Encounter (Signed)
PA DENIED PUT IN PROVIDER BOX

## 2018-02-09 NOTE — Progress Notes (Signed)
Subjective:   Patient ID: Hannah Nguyen, female   DOB: 57 y.o.   MRN: [id number]   HPI 57 year old female presents the office for first concerns of pain to the bottom of her left heel.  She also states that she has dry skin as well as calluses to her feet.  She states that she gets pain in the bottom of her heel.  She does a lot of walking or standing on when she first gets up.  She does not have pain a lot during the actual day when she works.  She denies any recent injury or trauma she denies any swelling or redness.  She said no recent treatment for these issues.  Pain does not wake her up at night.  She has no other concerns today.   Review of Systems  All other systems reviewed and are negative.  Past Medical History:  Diagnosis Date  . Bartholin's gland cyst 10/22/2011  . Elevated cholesterol   . GERD (gastroesophageal reflux disease)   . Hypertension   . Kidney infection 05/2015  . Leiomyoma of uterus 10/22/2011  . Migraine   . Nephrolithiasis   . Other and unspecified hyperlipidemia   . Seizures ([place])    2 months ago was last episode of uncontrollable rt arm motions and passed out  . Spinal stenosis 11/2014   L4-L5, L leg pain    Past Surgical History:  Procedure Laterality Date  . ABDOMINAL HYSTERECTOMY    . CESAREAN SECTION     x2  . EYE SURGERY     cataracts, both eyes   . KNEE ARTHROSCOPY     x2  . LITHOTRIPSY    . ROBOTIC ASSISTED LAP VAGINAL HYSTERECTOMY  10/25/2011   Procedure: ROBOTIC ASSISTED LAPAROSCOPIC VAGINAL HYSTERECTOMY;  Surgeon: Agnes Lawrence, MD;  Location: [facility];  Service: Gynecology;  Laterality: N/A;  Marsupralization og bartholin gland, cyst biopsy  . thumb surg       Current Outpatient Medications:  .  atorvastatin (LIPITOR) 20 MG tablet, TAKE 1 TABLET(20 MG) BY MOUTH DAILY AFTER SUPPER, Disp: 60 tablet, Rfl: 0 .  benzonatate (TESSALON) 100 MG capsule, Take 1-2 capsules (100-200 mg total) by mouth 3 (three) times daily as needed for  cough., Disp: 40 capsule, Rfl: 0 .  Cetirizine HCl (ZYRTEC ALLERGY PO), Take by mouth., Disp: , Rfl:  .  diclofenac sodium (VOLTAREN) 1 % GEL, Apply 2 g topically 4 (four) times daily. (Patient not taking: Reported on 01/21/2018), Disp: 100 g, Rfl: 0 .  Guaifenesin (MUCINEX MAXIMUM STRENGTH) 1200 MG TB12, Take 1 tablet (1,200 mg total) by mouth every 12 (twelve) hours as needed. (Patient not taking: Reported on 01/21/2018), Disp: 14 tablet, Rfl: 1 .  IBUPROFEN PO, Take by mouth as needed., Disp: , Rfl:  .  ipratropium (ATROVENT) 0.03 % nasal spray, USE 2 SPRAYS IN EACH NOSTRIL TWICE DAILY, Disp: 30 mL, Rfl: 0 .  levETIRAcetam (KEPPRA) 500 MG tablet, 250mg  in am, 500mg  in  pm., Disp: 135 tablet, Rfl: 3 .  lisinopril (PRINIVIL,ZESTRIL) 40 MG tablet, Take 1 tablet (40 mg total) by mouth daily., Disp: 90 tablet, Rfl: 3 .  lisinopril-hydrochlorothiazide (PRINZIDE,ZESTORETIC) 20-12.5 MG tablet, Take 1 tablet by mouth daily., Disp: , Rfl:  .  Multiple Vitamins-Minerals (MULTIVITAMINS THER. W/MINERALS) TABS, Take 1 tablet by mouth daily.  , Disp: , Rfl:  .  ofloxacin (OCUFLOX) 0.3 % ophthalmic solution, , Disp: , Rfl: 0 .  prednisoLONE acetate (PRED FORTE) 1 % ophthalmic suspension, ,  Disp: , Rfl: 0  Allergies  Allergen Reactions  . Vicodin [Hydrocodone-Acetaminophen] Hives and Itching    Social History   Socioeconomic History  . Marital status: Married    Spouse name: Owens Shark, estranged 07/2016  . Number of children: 2  . Years of education: 59  . Highest education level: Not on file  Occupational History  . 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 Financial resource strain: Not on file  . Food insecurity:    Worry: Not on file    Inability: Not on file  . Transportation needs:    Medical: Not on file    Non-medical: Not on file  Tobacco Use  . Smoking status: Never Smoker  .  Smokeless tobacco: Never Used  Substance and Sexual Activity  . Alcohol use: No    Alcohol/week: 0.0 oz  . Drug use: No  . Sexual activity: Yes    Birth control/protection: Surgical  Lifestyle  . Physical activity:    Days per week: Not on file    Minutes per session: Not on file  . Stress: Not on file  Relationships  . Social connections:    Talks on phone: Not on file    Gets together: Not on file    Attends religious service: Not on file    Active member of club or organization: Not on file    Attends meetings of clubs or organizations: Not on file    Relationship status: Not on file  . Intimate partner violence:    Fear of current or ex partner: Not on file    Emotionally abused: Not on file    Physically abused: Not on file    Forced sexual activity: Not on file  Other Topics Concern  . Not on file  Social History Narrative   Patient lives with her sister since separating from her husband 07/2016.   Patient works full time.   Right handed.    Caffeine- three sodas daily.      Smoke detectors in home? Yes    Guns in home? No    Consistent seatbelt use? Yes    Consistent dental brushing and flossing? Yes    Semi-Annual dental visits: Yes    Annual Eye exams: Yes         Objective:  Physical Exam  General: AAO x3, NAD  Dermatological: Dry skin present bilaterally.  Hyperkeratotic lesions bilateral submetatarsal.  Upon debridement no underlying ulceration drainage or any signs of infection noted.  No other open lesions or pre-ulcerative lesion identified today.  Vascular: Dorsalis Pedis artery and Posterior Tibial artery pedal pulses are 2/4 bilateral with immedate capillary fill time. There is no pain with calf compression, swelling, warmth, erythema.   Neruologic: Grossly intact via light touch bilateral. Protective threshold with Semmes Wienstein monofilament intact to all pedal sites bilateral.  Negative Tinel sign.  Musculoskeletal: Tenderness palpation on  the plantar medial tubercle of the calcaneus at the insertion of the plantar fascia on the left foot.  There is no pain on the course the plantar fascial the arch of the foot.  No pain with lateral compression.  Achilles tendon appears to be intact.  No other area tenderness identified.  There is no overlying edema, erythema, increase in warmth bilaterally.  Muscular strength 5/5 in all groups tested bilateral.  Gait: Unassisted, Nonantalgic.  Assessment:   Left heel pain, plantar fasciitis; hyperkeratotic lesions     Plan:  -Treatment options discussed including all alternatives, risks, and complications -Etiology of symptoms were discussed -X-rays were obtained and reviewed with the patient.  No evidence of acute fracture or stress fracture identified today. -We should proceed with a steroid injection.  See procedure note below.  Discussed stretching, icing exercises daily.  Discussed shoe modifications as well as orthotics. -Hyperkeratotic lesion Sharpy debrided without any complications or bleeding.  Discussed moisturizer daily.  Procedure: Injection Tendon/Ligament Discussed alternatives, risks, complications and verbal consent was obtained.  Location: Left plantar fascia at the glabrous junction; medial approach. Skin Prep: Alcohol. Injectate: 1 cc 0.5% marcaine plain, 0.5 cc 0.5% Marcaine plain and, 0.5 cc kenalog 10. Disposition: Patient tolerated procedure well. Injection site dressed with a band-aid.  Post-injection care was discussed and return precautions discussed.   Trula Slade DPM

## 2018-02-17 ENCOUNTER — Other Ambulatory Visit: Payer: Self-pay | Admitting: Physician Assistant

## 2018-02-17 DIAGNOSIS — E785 Hyperlipidemia, unspecified: Secondary | ICD-10-CM

## 2018-02-17 DIAGNOSIS — N3946 Mixed incontinence: Secondary | ICD-10-CM

## 2018-02-23 ENCOUNTER — Encounter: Payer: Self-pay | Admitting: Physician Assistant

## 2018-02-25 ENCOUNTER — Encounter: Payer: Self-pay | Admitting: Physician Assistant

## 2018-02-26 ENCOUNTER — Ambulatory Visit: Payer: BLUE CROSS/BLUE SHIELD | Admitting: Podiatry

## 2018-02-26 DIAGNOSIS — M722 Plantar fascial fibromatosis: Secondary | ICD-10-CM

## 2018-02-26 NOTE — Progress Notes (Signed)
Patient ID: Hannah Nguyen, female   DOB: [birth year], 57 y.o.   MRN: [id number] Patient presents for orthotic pick up.  Verbal and written break in and wear instructions given.  Patient will follow up in 4 weeks if symptoms worsen or fail to improve.

## 2018-02-26 NOTE — Patient Instructions (Signed)

## 2018-03-01 DIAGNOSIS — M722 Plantar fascial fibromatosis: Secondary | ICD-10-CM | POA: Insufficient documentation

## 2018-03-01 NOTE — Progress Notes (Signed)
Subjective: 57 year old female presents the office today for follow-up evaluation of left foot plantar fasciitis.  She states that she is doing better but she is actually requesting another injection today.  The first injection did help with her pain is starting to come back and she was to have another injection performed today.  She has been to the stretching icing exercises daily.  She also states that she is here to pick up orthotics.  She denies any recent injury or trauma since I last saw her she has no other concerns. Denies any systemic complaints such as fevers, chills, nausea, vomiting. No acute changes since last appointment, and no other complaints at this time.   Objective: AAO x3, NAD DP/PT pulses palpable bilaterally, CRT less than 3 seconds There is improved but continued tenderness to palpation along the plantar medial tubercle of the calcaneus at the insertion of plantar fascia on the left foot. There is no pain along the course of the plantar fascia within the arch of the foot. Plantar fascia appears to be intact. There is no pain with lateral compression of the calcaneus or pain with vibratory sensation. There is no pain along the course or insertion of the achilles tendon. No other areas of tenderness to bilateral lower extremities. No open lesions or pre-ulcerative lesions.  No pain with calf compression, swelling, warmth, erythema  Assessment: Left foot plantar fasciitis  Plan: -All treatment options discussed with the patient including all alternatives, risks, complications.  -He wishes to proceed with another steroid injection.  See procedure note below.  I want her to continue with stretching, icing exercises daily.  Orthotics were also dispensed today.  Break instructions were discussed.  Follow-up in 4 weeks or sooner if needed. -Patient encouraged to call the office with any questions, concerns, change in symptoms.   Procedure: Injection Tendon/Ligament- this is her 2nd  injection  Discussed alternatives, risks, complications and verbal consent was obtained.  Location: Left plantar fascia at the glabrous junction; medial approach. Skin Prep: Alcohol. Injectate: 0.5cc 0.5% marcaine plain, 0.5 cc 2% lidocaine plain and, 1 cc kenalog 10. Disposition: Patient tolerated procedure well. Injection site dressed with a band-aid.  Post-injection care was discussed and return precautions discussed.   Trula Slade DPM

## 2018-03-02 ENCOUNTER — Other Ambulatory Visit: Payer: Self-pay | Admitting: Physician Assistant

## 2018-03-02 DIAGNOSIS — N3946 Mixed incontinence: Secondary | ICD-10-CM

## 2018-03-03 ENCOUNTER — Encounter: Payer: Self-pay | Admitting: Podiatry

## 2018-03-03 ENCOUNTER — Ambulatory Visit (INDEPENDENT_AMBULATORY_CARE_PROVIDER_SITE_OTHER): Payer: BLUE CROSS/BLUE SHIELD

## 2018-03-03 ENCOUNTER — Ambulatory Visit: Payer: BLUE CROSS/BLUE SHIELD | Admitting: Podiatry

## 2018-03-03 DIAGNOSIS — M722 Plantar fascial fibromatosis: Secondary | ICD-10-CM

## 2018-03-03 DIAGNOSIS — M779 Enthesopathy, unspecified: Secondary | ICD-10-CM

## 2018-03-03 MED ORDER — METHYLPREDNISOLONE 4 MG PO TBPK: ORAL_TABLET | ORAL | 0 refills | Status: DC

## 2018-03-03 NOTE — Telephone Encounter (Signed)
LOV 12/24/17 Hannah Nguyen

## 2018-03-04 NOTE — Progress Notes (Signed)
Subjective: 57 year old female presents the office with concerns of increasing the left foot.  She has some bruising along the injection site which she has not had before and yesterday she started noticing increasing pain ball of her foot as well as the side of her foot.  She denies any recent injury or trauma.  It does not appear that the increase in pain to the foot started up until yesterday.  No redness or warmth associated with the swelling. She denies any systemic complaints such as fevers, chills, nausea, vomiting. No acute changes since last appointment, and no other complaints at this time.   Objective: AAO x3, NAD DP/PT pulses palpable bilaterally, CRT less than 3 seconds There is still tenderness palpation on the plantar medial tubercle of the calcaneus at the insertion of plantar fascia on the left foot.  There is no pain with lateral compression of calcaneus.  There is tenderness in the course of the peroneal tendon just inferior to the lateral malleolus along the insertion of the fifth metatarsal base.  Also mild discomfort submetatarsal 1-5 on the left foot.  There is no area pinpoint bony tenderness or pain to vibratory sensation.  There is no pain to the Achilles tendon.  Achilles tendon, peroneal tendon, flexor, extensor tendons appear to be intact.  No open lesions or pre-ulcerative lesions.  There is mild swelling to the foot but there is no erythema or increase in warmth or any clinical signs of infection present. No pain with calf compression, swelling, warmth, erythema  Assessment: Left foot pain, plantar fasciitis/tendinitis  Plan: -All treatment options discussed with the patient including all alternatives, risks, complications.  -Repeat x-rays were obtained and reviewed.  No definitive evidence of acute fracture or stress fracture identified today. -Medrol dose pack prescribed.  -Plantar fascial taping was applied.  She states that she has had this several times and finds  been helpful. -Ice to the area daily. -Discussed the cam boot today but she cannot wear it at work so therefore held off on this. -Follow-up in 2 weeks or sooner if needed.  Call any questions or concerns. -Patient encouraged to call the office with any questions, concerns, change in symptoms.   Trula Slade DPM

## 2018-03-17 ENCOUNTER — Other Ambulatory Visit: Payer: Self-pay | Admitting: Physician Assistant

## 2018-03-17 DIAGNOSIS — J329 Chronic sinusitis, unspecified: Secondary | ICD-10-CM

## 2018-03-17 DIAGNOSIS — J310 Chronic rhinitis: Secondary | ICD-10-CM

## 2018-03-17 NOTE — Telephone Encounter (Signed)
ipratroprium (Atrovent) refill Last OV: 12/24/17 Last Refill:02/02/18 Pharmacy:[facility] [address]  Harrison Mons PA-C

## 2018-03-19 ENCOUNTER — Ambulatory Visit: Payer: BLUE CROSS/BLUE SHIELD | Admitting: Podiatry

## 2018-03-23 ENCOUNTER — Ambulatory Visit: Payer: BLUE CROSS/BLUE SHIELD | Admitting: Physician Assistant

## 2018-03-30 ENCOUNTER — Other Ambulatory Visit: Payer: Self-pay | Admitting: Physician Assistant

## 2018-03-30 DIAGNOSIS — E785 Hyperlipidemia, unspecified: Secondary | ICD-10-CM

## 2018-03-31 ENCOUNTER — Ambulatory Visit: Payer: BLUE CROSS/BLUE SHIELD | Admitting: Podiatry

## 2018-03-31 ENCOUNTER — Ambulatory Visit: Payer: BLUE CROSS/BLUE SHIELD | Admitting: Physician Assistant

## 2018-04-17 ENCOUNTER — Encounter: Payer: Self-pay | Admitting: Physician Assistant

## 2018-04-17 ENCOUNTER — Other Ambulatory Visit: Payer: Self-pay

## 2018-04-17 ENCOUNTER — Ambulatory Visit: Payer: BLUE CROSS/BLUE SHIELD | Admitting: Physician Assistant

## 2018-04-17 VITALS — BP 148/72 | HR 80 | Temp 98.2°F | Resp 16 | Ht 66.0 in | Wt 175.2 lb

## 2018-04-17 DIAGNOSIS — J309 Allergic rhinitis, unspecified: Secondary | ICD-10-CM | POA: Insufficient documentation

## 2018-04-17 DIAGNOSIS — E785 Hyperlipidemia, unspecified: Secondary | ICD-10-CM

## 2018-04-17 DIAGNOSIS — F439 Reaction to severe stress, unspecified: Secondary | ICD-10-CM | POA: Diagnosis not present

## 2018-04-17 DIAGNOSIS — Z6830 Body mass index (BMI) 30.0-30.9, adult: Secondary | ICD-10-CM | POA: Diagnosis not present

## 2018-04-17 DIAGNOSIS — N3946 Mixed incontinence: Secondary | ICD-10-CM | POA: Diagnosis not present

## 2018-04-17 DIAGNOSIS — G43919 Migraine, unspecified, intractable, without status migrainosus: Secondary | ICD-10-CM | POA: Diagnosis not present

## 2018-04-17 DIAGNOSIS — I10 Essential (primary) hypertension: Secondary | ICD-10-CM

## 2018-04-17 MED ORDER — BENZONATATE 100 MG PO CAPS: 100.0000 mg | ORAL_CAPSULE | Freq: Three times a day (TID) | ORAL | 0 refills | Status: DC | PRN

## 2018-04-17 MED ORDER — TRAZODONE HCL 50 MG PO TABS: 25.0000 mg | ORAL_TABLET | Freq: Every evening | ORAL | 3 refills | Status: DC | PRN

## 2018-04-17 MED ORDER — AZELASTINE HCL 0.1 % NA SOLN: 2.0000 | Freq: Two times a day (BID) | NASAL | 2 refills | Status: DC

## 2018-04-17 NOTE — Progress Notes (Signed)
Subjective:    Patient ID: Hannah Nguyen, female    DOB: 1961-10-18, 57 y.o.   MRN: [id number]   Patient presents for blood pressure follow up. She is also fasting today and would like her labs rechecked. She is not monitoring her home blood pressure currently. She has had a recent increase in life stressors that have elevated her bp. She had to place her mother in a skilled nursing facility due to chronic renal, mental health, and mobility issues. She has subsequently had to clear her mother's apartment of belongings to which she has not received support from her sister whom she lives with. 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  Of note, she complains of left wrist pain that radiates to her middle/ring fingers causing the digits to become immobile. This occurs if she is doing repetitive rotating movements while cooking at work.  She also notes pain in the right posterior popliteal fossa after rising from sitting for a period of time. The pain feels like something is being "stretched."       Patient Active Problem List   Diagnosis Date Noted  . Plantar fasciitis 03/01/2018  . Mixed incontinence 10/21/2017  . Nephrolithiasis 10/29/2016  . Spinal stenosis of lumbar region 03/07/2015  . BMI 30.0-30.9,adult 03/07/2015  . HTN (hypertension) 01/31/2014  . Hyperlipidemia 01/31/2014  . Migraine   . Partial seizure ([place]) 08/17/2012   Prior to Admission medications   Medication Sig Start Date End Date Taking? Authorizing Provider  atorvastatin (LIPITOR) 20 MG tablet TAKE 1 TABLET(20 MG) BY MOUTH DAILY AFTER SUPPER 03/31/18  Yes Jeffery, Chelle, PA-C  benzonatate (TESSALON) 100 MG capsule Take 1-2 capsules (100-200 mg total) by mouth 3 (three) times daily as needed for cough. 10/21/17  Yes  Jeffery, Chelle, PA-C  Cetirizine HCl (ZYRTEC ALLERGY PO) Take by mouth.   Yes [provider]  IBUPROFEN PO Take by mouth as needed.   Yes [provider]  levETIRAcetam (KEPPRA) 500 MG tablet 250mg  in am, 500mg  in  pm. 01/21/18  Yes Dennie Bible, NP  lisinopril (PRINIVIL,ZESTRIL) 40 MG tablet Take 1 tablet (40 mg total) by mouth daily. 10/21/17  Yes Jeffery, Chelle, PA-C  Multiple Vitamins-Minerals (MULTIVITAMINS THER. W/MINERALS) TABS Take 1 tablet by mouth daily.     Yes [provider]  diclofenac sodium (VOLTAREN) 1 % GEL Apply 2 g topically 4 (four) times daily. Patient not taking: Reported on 01/21/2018 12/24/17   Ivar Drape D, PA  Guaifenesin [facility] MAXIMUM STRENGTH) 1200 MG TB12 Take 1 tablet (1,200 mg total) by mouth every 12 (twelve) hours as needed. Patient not taking: Reported on 01/21/2018 12/24/17   Ivar Drape D, PA  ipratropium (ATROVENT) 0.03 % nasal spray USE 2 SPRAYS IN [facility] NOSTRIL TWICE DAILY Patient not taking: Reported on 04/17/2018 03/17/18   Harrison Mons, PA-C  lisinopril-hydrochlorothiazide (PRINZIDE,ZESTORETIC) 20-12.5 MG tablet Take 1 tablet by mouth daily.    [provider]  methylPREDNISolone (MEDROL DOSEPAK) 4 MG TBPK tablet Take as directed Patient not taking: Reported on 04/17/2018 03/03/18   Trula Slade, DPM  ofloxacin (OCUFLOX) 0.3 % ophthalmic solution  09/29/17   [provider]  prednisoLONE acetate (PRED FORTE) 1 % ophthalmic suspension  09/29/17   [provider]  Allergies  Allergen Reactions  . Vicodin [Hydrocodone-Acetaminophen] Hives and Itching      Review of Systems  Constitutional: Negative for appetite change, chills, fever and unexpected weight change.  HENT: Positive for sinus pressure. Negative for congestion, rhinorrhea and sore throat.   Eyes: Negative for pain and visual disturbance.  Respiratory: Negative for cough, chest tightness and shortness of breath.    Cardiovascular: Negative for chest pain.       Tachycardia when stressed out    Gastrointestinal: Negative for abdominal pain, blood in stool, constipation, diarrhea, nausea and vomiting.  Genitourinary: Negative for dysuria, frequency, pelvic pain, vaginal bleeding and vaginal discharge.  Skin:       Left leg is cold   Neurological: Positive for headaches. Negative for dizziness and numbness.       Objective:   Physical Exam  Constitutional: She is oriented to person, place, and time. She appears well-developed and well-nourished. No distress.  HENT:  Head: Normocephalic and atraumatic.  Eyes: Pupils are equal, round, and reactive to light. Conjunctivae are normal.  Neck: No thyromegaly present.  Cardiovascular: Normal rate, regular rhythm and normal heart sounds. Exam reveals no gallop and no friction rub.  No murmur heard. Pulses:      Radial pulses are 2+ on the right side, and 2+ on the left side.  Pulmonary/Chest: Effort normal and breath sounds normal. She has no wheezes. She has no rales. She exhibits no tenderness.  Lymphadenopathy:    She has no cervical adenopathy.  Neurological: She is alert and oriented to person, place, and time.  Skin: Skin is warm and dry.          Assessment & Plan:  1. Hypertension, unspecified type -Continue current medication regimen. Will add trazodone (as below) to help with sleep and subsequently decrease adverse response to stress.  - CBC with Differential/Platelet - Comprehensive metabolic panel - TSH - Care order/instruction:  2. Hyperlipidemia, unspecified hyperlipidemia type Well controlled, continue current medication and reevaluate based on pending labs: - Comprehensive metabolic panel - Lipid panel  3. BMI 30.0-30.9,adult  4. Intractable migraine without status migrainosus, unspecified migraine type  5. Mixed incontinence -Well controlled since removal of HCTZ.  6. Spinal stenosis of lumbar region, unspecified  whether neurogenic claudication present -Reevaluate at follow up visit.  7. Allergic rhinitis, unspecified seasonality, unspecified trigger - azelastine (ASTELIN) 0.1 % nasal spray; Place 2 sprays into both nostrils 2 (two) times daily. Use in each nostril as directed  Dispense: 30 mL; Refill: 2 - benzonatate (TESSALON) 100 MG capsule; Take 1-2 capsules (100-200 mg total) by mouth 3 (three) times daily as needed for cough.  Dispense: 40 capsule; Refill: 0  8. Situational stress - Due to increased stressors and lack of consistent sleep, try trazodone to assist in managing stress. - traZODone (DESYREL) 50 MG tablet; Take 0.5-1 tablets (25-50 mg total) by mouth at bedtime as needed for sleep.  Dispense: 30 tablet; Refill: 3  Return in about 2 months (around 06/17/2018) for re-evaluation of stress, sleep, blood pressure.

## 2018-04-17 NOTE — Patient Instructions (Addendum)
Go ahead and call [facility] [place] to schedule your next visit with me there. [phone].  Try the trazodone at bedtime. If it makes you feel too sleepy in the morning, try taking only half. Remember that you can only do what you can do. And you have to care for yourself, to be able to take care of others.  IF you received an x-ray today, you will receive an invoice from [facility]. Please contact [facility] at [phone] with questions or concerns regarding your invoice.   IF you received labwork today, you will receive an invoice from [facility]. Please contact [facility] at [phone] with questions or concerns regarding your invoice.   Our billing staff will not be able to assist you with questions regarding bills from these companies.  You will be contacted with the lab results as soon as they are available. The fastest way to get your results is to activate your My Chart account. Instructions are located on the last page of this paperwork. If you have not heard from [place] regarding the results in 2 weeks, please contact this office.

## 2018-04-17 NOTE — Progress Notes (Signed)
Patient ID: Hannah Nguyen, female    DOB: Feb 28, 1961, 57 y.o.   MRN: [id number]  PCP: Harrison Mons, PA-C  Chief Complaint  Patient presents with  . Hypertension    follow up     Subjective:   Presents for evaluation of HTN.  She is fasting today, desires reevaluation of hyperlipidemia.  Not currently monitoring blood pressure at home.  She notes some increased life stressors.  She had to place her mother in a skilled nursing facility due to ongoing renal and mental health issues.  She has had to clean out her mother's apartment.  She is living with her sister along with her sister's daughter and granddaughter.  She feels like she is the only one doing any housework.  Her niece and the niece's children come to her rather than her sister for support.  In addition she is working 2 jobs.  Urinary frequency has considerably improved since discontinuation of HCTZ.  She does have urge incontinence if she holds her urine too long as well as some lower abdominal pain.  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  Also describes pain and "stretching" sensation in the right posterior knee when she rises from sitting for longer periods of time.    Review of Systems Constitutional: Negative for appetite change, chills, fever and unexpected weight change.  HENT: Positive for sinus pressure. Negative for congestion, rhinorrhea and sore throat.   Eyes: Negative for pain and visual disturbance.  Respiratory: Negative for cough, chest tightness and shortness of breath.   Cardiovascular: Negative for chest pain.       Tachycardia when stressed out    Gastrointestinal: Negative for abdominal pain, blood in stool, constipation, diarrhea, nausea and vomiting.  Genitourinary: Negative for dysuria, frequency, pelvic pain, vaginal bleeding and vaginal discharge.  Skin:       Left leg  is cold   Neurological: Positive for headaches. Negative for dizziness and numbness.       Patient Active Problem List   Diagnosis Date Noted  . Plantar fasciitis 03/01/2018  . Mixed incontinence 10/21/2017  . Nephrolithiasis 10/29/2016  . Spinal stenosis of lumbar region 03/07/2015  . BMI 30.0-30.9,adult 03/07/2015  . HTN (hypertension) 01/31/2014  . Hyperlipidemia 01/31/2014  . Migraine   . Partial seizure ([place]) 08/17/2012     Prior to Admission medications   Medication Sig Start Date End Date Taking? Authorizing Provider  atorvastatin (LIPITOR) 20 MG tablet TAKE 1 TABLET(20 MG) BY MOUTH DAILY AFTER SUPPER 03/31/18  Yes Dylann Layne, PA-C  Cetirizine HCl (ZYRTEC ALLERGY PO) Take by mouth.   Yes [provider]  IBUPROFEN PO Take by mouth as needed.   Yes [provider]  levETIRAcetam (KEPPRA) 500 MG tablet 250mg  in am, 500mg  in  pm. 01/21/18  Yes Dennie Bible, NP  lisinopril (PRINIVIL,ZESTRIL) 40 MG tablet Take 1 tablet (40 mg total) by mouth daily. 10/21/17  Yes Makenzye Troutman, PA-C  Multiple Vitamins-Minerals (MULTIVITAMINS THER. W/MINERALS) TABS Take 1 tablet by mouth daily.     Yes [provider]     Allergies  Allergen Reactions  . Vicodin [Hydrocodone-Acetaminophen] Hives and Itching       Objective:  Physical Exam  Constitutional: She is oriented to person, place, and time. She appears well-developed and well-nourished. She is active and cooperative. No distress.  BP (!) 148/72   Pulse 80  Temp 98.2 F (36.8 C)   Resp 16   Ht 5\' 6"  (1.676 m)   Wt 175 lb 3.2 oz (79.5 kg)   LMP 09/22/2011   SpO2 98%   BMI 28.28 kg/m   HENT:  Head: Normocephalic and atraumatic.  Right Ear: Hearing normal.  Left Ear: Hearing normal.  Eyes: Conjunctivae are normal. No scleral icterus.  Neck: Normal range of motion. Neck supple. No thyromegaly present.  Cardiovascular: Normal rate, regular rhythm and normal heart sounds.  Pulses:       Radial pulses are 2+ on the right side, and 2+ on the left side.  Pulmonary/Chest: Effort normal and breath sounds normal.  Musculoskeletal: Normal range of motion. She exhibits no edema, tenderness or deformity.       Right knee: Normal.       Left knee: Normal.  Lymphadenopathy:       Head (right side): No tonsillar, no preauricular, no posterior auricular and no occipital adenopathy present.       Head (left side): No tonsillar, no preauricular, no posterior auricular and no occipital adenopathy present.    She has no cervical adenopathy.       Right: No supraclavicular adenopathy present.       Left: No supraclavicular adenopathy present.  Neurological: She is alert and oriented to person, place, and time. No sensory deficit.  Skin: Skin is warm, dry and intact. No rash noted. No cyanosis or erythema. Nails show no clubbing.  Psychiatric: She has a normal mood and affect. Her speech is normal and behavior is normal.       BP Readings from Last 3 Encounters:  04/17/18 (!) 138/96  01/21/18 133/74  12/24/17 140/80   Wt Readings from Last 3 Encounters:  04/17/18 175 lb 3.2 oz (79.5 kg)  01/21/18 176 lb 6.4 oz (80 kg)  12/24/17 181 lb 9.6 oz (82.4 kg)       Assessment & Plan:   Problem List Items Addressed This Visit    Mixed incontinence    Much improved since the elimination of diuretic.  Counseled on the importance of adequate hydration as well as not delaying urination when she feels the urge.      Migraine   Relevant Medications   traZODone (DESYREL) 50 MG tablet   Hyperlipidemia (Chronic)    Update fasting labs today.  Await results.  If needed, increase atorvastatin from 20 mg to 40 mg.      Relevant Orders   Comprehensive metabolic panel (Completed)   Lipid panel (Completed)   HTN (hypertension) - Primary    Not adequately controlled today.  Likely due to increased stress including lack of restorative sleep.  Address stress in anticipation of seeing  improvement in blood pressure control.  However if blood pressure remains elevated at her next visit would add calcium channel blocker.      Relevant Orders   CBC with Differential/Platelet (Completed)   Comprehensive metabolic panel (Completed)   TSH (Completed)   Care order/instruction: (Completed)   BMI 30.0-30.9,adult    Healthy eating and adequate exercise are difficult due to her very busy schedule.  Focus on healthy eating choices for now.      Allergic rhinitis    Likely etiology of her persistent cough.  Add azelastine nasal spray and a refill benzonatate.      Relevant Medications   azelastine (ASTELIN) 0.1 % nasal spray   benzonatate (TESSALON) 100 MG capsule    Other Visit Diagnoses  Situational stress       Much of this is not within her control.  Improve sleep with the addition of trazodone.   Relevant Medications   traZODone (DESYREL) 50 MG tablet       Return in about 2 months (around 06/17/2018) for re-evaluation of stress, sleep, blood pressure.   Fara Chute, PA-C [facility]

## 2018-04-18 LAB — COMPREHENSIVE METABOLIC PANEL
ALBUMIN: 4.4 g/dL (ref 3.5–5.5)
ALT: 36 IU/L — AB (ref 0–32)
AST: 30 IU/L (ref 0–40)
Albumin/Globulin Ratio: 1.5 (ref 1.2–2.2)
Alkaline Phosphatase: 119 IU/L — ABNORMAL HIGH (ref 39–117)
BILIRUBIN TOTAL: 0.4 mg/dL (ref 0.0–1.2)
BUN / CREAT RATIO: 15 (ref 9–23)
BUN: 12 mg/dL (ref 6–24)
CALCIUM: 9.7 mg/dL (ref 8.7–10.2)
CO2: 25 mmol/L (ref 20–29)
Chloride: 103 mmol/L (ref 96–106)
Creatinine, Ser: 0.81 mg/dL (ref 0.57–1.00)
GFR calc non Af Amer: 82 mL/min/{1.73_m2} (ref >59)
GFR, EST AFRICAN AMERICAN: 95 mL/min/{1.73_m2} (ref >59)
GLUCOSE: 83 mg/dL (ref 65–99)
Globulin, Total: 2.9 g/dL (ref 1.5–4.5)
Potassium: 4.3 mmol/L (ref 3.5–5.2)
Sodium: 143 mmol/L (ref 134–144)
TOTAL PROTEIN: 7.3 g/dL (ref 6.0–8.5)

## 2018-04-18 LAB — CBC WITH DIFFERENTIAL/PLATELET
BASOS ABS: 0 10*3/uL (ref 0.0–0.2)
BASOS: 0 %
EOS (ABSOLUTE): 0.1 10*3/uL (ref 0.0–0.4)
Eos: 2 %
HEMOGLOBIN: 14 g/dL (ref 11.1–15.9)
Hematocrit: 41.8 % (ref 34.0–46.6)
IMMATURE GRANS (ABS): 0 10*3/uL (ref 0.0–0.1)
IMMATURE GRANULOCYTES: 0 %
LYMPHS: 36 %
Lymphocytes Absolute: 2.4 10*3/uL (ref 0.7–3.1)
MCH: 31.5 pg (ref 26.6–33.0)
MCHC: 33.5 g/dL (ref 31.5–35.7)
MCV: 94 fL (ref 79–97)
MONOCYTES: 5 %
Monocytes Absolute: 0.4 10*3/uL (ref 0.1–0.9)
NEUTROS ABS: 3.8 10*3/uL (ref 1.4–7.0)
NEUTROS PCT: 57 %
PLATELETS: 280 10*3/uL (ref 150–450)
RBC: 4.44 x10E6/uL (ref 3.77–5.28)
RDW: 14.3 % (ref 12.3–15.4)
WBC: 6.6 10*3/uL (ref 3.4–10.8)

## 2018-04-18 LAB — TSH: TSH: 0.69 u[IU]/mL (ref 0.450–4.500)

## 2018-04-18 LAB — LIPID PANEL
CHOL/HDL RATIO: 3.2 ratio (ref 0.0–4.4)
Cholesterol, Total: 177 mg/dL (ref 100–199)
HDL: 56 mg/dL (ref >39)
LDL CALC: 109 mg/dL — AB (ref 0–99)
Triglycerides: 59 mg/dL (ref 0–149)
VLDL CHOLESTEROL CAL: 12 mg/dL (ref 5–40)

## 2018-04-20 NOTE — Assessment & Plan Note (Signed)
Update fasting labs today.  Await results.  If needed, increase atorvastatin from 20 mg to 40 mg.

## 2018-04-20 NOTE — Assessment & Plan Note (Signed)
Much improved since the elimination of diuretic.  Counseled on the importance of adequate hydration as well as not delaying urination when she feels the urge.

## 2018-04-20 NOTE — Assessment & Plan Note (Signed)
Not adequately controlled today.  Likely due to increased stress including lack of restorative sleep.  Address stress in anticipation of seeing improvement in blood pressure control.  However if blood pressure remains elevated at her next visit would add calcium channel blocker.

## 2018-04-20 NOTE — Assessment & Plan Note (Signed)
Likely etiology of her persistent cough.  Add azelastine nasal spray and a refill benzonatate.

## 2018-04-20 NOTE — Assessment & Plan Note (Signed)
Healthy eating and adequate exercise are difficult due to her very busy schedule.  Focus on healthy eating choices for now.

## 2018-04-29 ENCOUNTER — Telehealth: Payer: Self-pay

## 2018-04-29 DIAGNOSIS — F439 Reaction to severe stress, unspecified: Secondary | ICD-10-CM

## 2018-04-29 MED ORDER — HYDROXYZINE HCL 25 MG PO TABS: 25.0000 mg | ORAL_TABLET | Freq: Every evening | ORAL | 0 refills | Status: DC | PRN

## 2018-04-29 NOTE — Telephone Encounter (Signed)
Copied from [place] [id number]. Topic: General - Other >> Apr 29, 2018  9:46 AM Carolyn Stare wrote:  Pt said the below med is causing her to have headaches and is asking for a call back  traZODone (DESYREL) 50 MG tablet  [phone]   Message sent to [facility]

## 2018-04-29 NOTE — Addendum Note (Signed)
Addended by: Fara Chute on: 04/29/2018 07:08 PM   Modules accepted: Orders

## 2018-04-29 NOTE — Telephone Encounter (Signed)
Phone call to patient to discuss headaches.   Patient started waking up with migraines for the last two days. Had one this morning. Took half a trazodone last night. States the pain was splitting and persistent. Located at patient's right eye and temple. Patient tried eating something and resting, wasn't helpful for her symptoms.  She states she recently started trazodone and believes this is the source of her headaches. It was helping at first, but then started having headaches two days ago. Patient notes her children have tried trazodone and experienced similar side effects.   Patient's last BP was high in clinic. Denies chest pain, palpitations, visual changes.   Patient would like to know if there a different type of medicine that can be tried to help her sleep. Pharmacy confirmed.   Provider, please prescribe alternate medication for patient.

## 2018-04-29 NOTE — Telephone Encounter (Signed)
I apologize for misunderstanding the original message, and thank you for the details regarding the patient's headaches!  1. Stop the trazodone. 2. Try hydroxyzine at HS to help sleep.  Meds ordered this encounter  Medications  . hydrOXYzine (ATARAX/VISTARIL) 25 MG tablet    Sig: Take 1-3 tablets (25-75 mg total) by mouth at bedtime as needed for anxiety (insomnia, stress).    Dispense:  90 tablet    Refill:  0    Order Specific Question:   Supervising Provider    Answer:   Brigitte Pulse, EVA N [[id number]]

## 2018-04-29 NOTE — Telephone Encounter (Signed)
In May, I prescribed this medication with 3 refills. She just needs to ask her pharmacist to fill it.

## 2018-04-30 NOTE — Telephone Encounter (Signed)
Phone call to patient. Relayed message from provider, she verbalizes understanding.

## 2018-05-07 DIAGNOSIS — H2513 Age-related nuclear cataract, bilateral: Secondary | ICD-10-CM | POA: Diagnosis not present

## 2018-06-20 ENCOUNTER — Other Ambulatory Visit: Payer: Self-pay

## 2018-06-20 ENCOUNTER — Encounter: Payer: Self-pay | Admitting: Family Medicine

## 2018-06-20 ENCOUNTER — Ambulatory Visit: Payer: BLUE CROSS/BLUE SHIELD | Admitting: Family Medicine

## 2018-06-20 VITALS — BP 160/80 | HR 75 | Temp 98.3°F | Ht 64.5 in | Wt 177.6 lb

## 2018-06-20 DIAGNOSIS — Z8669 Personal history of other diseases of the nervous system and sense organs: Secondary | ICD-10-CM

## 2018-06-20 DIAGNOSIS — R51 Headache: Secondary | ICD-10-CM | POA: Diagnosis not present

## 2018-06-20 DIAGNOSIS — R519 Headache, unspecified: Secondary | ICD-10-CM

## 2018-06-20 DIAGNOSIS — M26629 Arthralgia of temporomandibular joint, unspecified side: Secondary | ICD-10-CM

## 2018-06-20 MED ORDER — CYCLOBENZAPRINE HCL 5 MG PO TABS: 5.0000 mg | ORAL_TABLET | Freq: Three times a day (TID) | ORAL | 0 refills | Status: DC | PRN

## 2018-06-20 NOTE — Patient Instructions (Addendum)
Tylenol up to 4 times per day for acute headache.   Rest, and drink plenty of fluids.  Flexeril every 8 hours as needed, but be careful with that medication due to sedation and risk of falls.  I will check a sed rate to rule out more concerning kind of headache. Current headache may be due to migraine, TMJ, blood pressure or combination.   Keep follow up with primary care provider in next 2-3 days for follow up of headache.   Return to the clinic or go to the nearest emergency room if any of your symptoms worsen or new symptoms occur.    General Headache Without Cause A headache is pain or discomfort felt around the head or neck area. There are many causes and types of headaches. In some cases, the cause may not be found. Follow these instructions at home: Managing pain  Take over-the-counter and prescription medicines only as told by your doctor.  Lie down in a dark, quiet room when you have a headache.  If directed, apply ice to the head and neck area: ? Put ice in a plastic bag. ? Place a towel between your skin and the bag. ? Leave the ice on for 20 minutes, 2-3 times per day.  Use a heating pad or hot shower to apply heat to the head and neck area as told by your doctor.  Keep lights dim if bright lights bother you or make your headaches worse. Eating and drinking  Eat meals on a regular schedule.  Lessen how much alcohol you drink.  Lessen how much caffeine you drink, or stop drinking caffeine. General instructions  Keep all follow-up visits as told by your doctor. This is important.  Keep a journal to find out if certain things bring on headaches. For example, write down: ? What you eat and drink. ? How much sleep you get. ? Any change to your diet or medicines.  Relax by getting a massage or doing other relaxing activities.  Lessen stress.  Sit up straight. Do not tighten (tense) your muscles.  Do not use tobacco products. This includes cigarettes, chewing  tobacco, or e-cigarettes. If you need help quitting, ask your doctor.  Exercise regularly as told by your doctor.  Get enough sleep. This often means 7-9 hours of sleep. Contact a doctor if:  Your symptoms are not helped by medicine.  You have a headache that feels different than the other headaches.  You feel sick to your stomach (nauseous) or you throw up (vomit).  You have a fever. Get help right away if:  Your headache becomes really bad.  You keep throwing up.  You have a stiff neck.  You have trouble seeing.  You have trouble speaking.  You have pain in the eye or ear.  Your muscles are weak or you lose muscle control.  You lose your balance or have trouble walking.  You feel like you will pass out (faint) or you pass out.  You have confusion. This information is not intended to replace advice given to you by your health care provider. Make sure you discuss any questions you have with your health care provider. Document Released: 08/13/2008 Document Revised: 04/11/2016 Document Reviewed: 02/27/2015 Elsevier Interactive Patient Education  2018 [employer].     IF you received an x-ray today, you will receive an invoice from [facility]. Please contact [facility] at [phone] with questions or concerns regarding your invoice.   IF you received labwork today, you  will receive an invoice from [facility]. Please contact [facility] at [phone] with questions or concerns regarding your invoice.   Our billing staff will not be able to assist you with questions regarding bills from these companies.  You will be contacted with the lab results as soon as they are available. The fastest way to get your results is to activate your My Chart account. Instructions are located on the last page of this paperwork. If you have not heard from [place] regarding the results in 2 weeks, please contact this office.

## 2018-06-20 NOTE — Progress Notes (Signed)
Subjective:    Patient ID: Hannah Nguyen, female    DOB: 1961-03-09, 57 y.o.   MRN: [id number]  HPI Hannah Nguyen is a 57 y.o. female Presents today for: Chief Complaint  Patient presents with  . Migraine    going on for 3-4 days on the right side of head   Presents with right-sided headache.  History of migraine headaches, hypertension, other per problem list, including history of partial seizure, most recently seen by neurology January 21, 2018.  No seizures in 5 years at that time. No recent seizure activity.  R sided HA past 3-4 days. No fall/injury. No n/v. Slight different taste- alka seltzer.  Tx: ibuprofen 800mg  yesterday - no relief. excedrin migraine 2 days ago - min relief.  No weakness, no slurred speech.   locates pain to R temple toward the ear. Slight pressure R ear.  Sneezing with allergies, no nasal d/c. No new sinus pressure/pain. Normal eye exam a few months ago. No vision change with current HA, no amaurosis fugax.   HTN: Recent change form 20/25mg  lisinopril HCT.  Has appt next week with PCP. BP 148/72 in May, but had discontinued HCTZ due to frequent urination prior. Now on lisinopril 40mg  qd.   Patient Active Problem List   Diagnosis Date Noted  . Allergic rhinitis 04/17/2018  . Plantar fasciitis 03/01/2018  . Mixed incontinence 10/21/2017  . Nephrolithiasis 10/29/2016  . Spinal stenosis of lumbar region 03/07/2015  . BMI 30.0-30.9,adult 03/07/2015  . HTN (hypertension) 01/31/2014  . Hyperlipidemia 01/31/2014  . Migraine   . Partial seizure ([place]) 08/17/2012   Past Medical History:  Diagnosis Date  . Bartholin's gland cyst 10/22/2011  . Elevated cholesterol   . GERD (gastroesophageal reflux disease)   . Hypertension   . Kidney infection 05/2015  . Leiomyoma of uterus 10/22/2011  . Migraine   . Nephrolithiasis   . Other and unspecified hyperlipidemia   . Seizures ([place])    2 months ago was last episode of uncontrollable rt arm motions and passed  out  . Spinal stenosis 11/2014   L4-L5, L leg pain   Past Surgical History:  Procedure Laterality Date  . ABDOMINAL HYSTERECTOMY    . CESAREAN SECTION     x2  . EYE SURGERY     cataracts, both eyes   . KNEE ARTHROSCOPY     x2  . LITHOTRIPSY    . ROBOTIC ASSISTED LAP VAGINAL HYSTERECTOMY  10/25/2011   Procedure: ROBOTIC ASSISTED LAPAROSCOPIC VAGINAL HYSTERECTOMY;  Surgeon: Agnes Lawrence, MD;  Location: [facility];  Service: Gynecology;  Laterality: N/A;  Marsupralization og bartholin gland, cyst biopsy  . thumb surg     Allergies  Allergen Reactions  . Vicodin [Hydrocodone-Acetaminophen] Hives and Itching   Prior to Admission medications   Medication Sig Start Date End Date Taking? Authorizing Provider  atorvastatin (LIPITOR) 20 MG tablet TAKE 1 TABLET(20 MG) BY MOUTH DAILY AFTER SUPPER 03/31/18  Yes Jeffery, Chelle, PA-C  azelastine (ASTELIN) 0.1 % nasal spray Place 2 sprays into both nostrils 2 (two) times daily. Use in each nostril as directed 04/17/18  Yes Jeffery, Chelle, PA-C  benzonatate (TESSALON) 100 MG capsule Take 1-2 capsules (100-200 mg total) by mouth 3 (three) times daily as needed for cough. 04/17/18  Yes Jeffery, Chelle, PA-C  Cetirizine HCl (ZYRTEC ALLERGY PO) Take by mouth.   Yes [provider]  IBUPROFEN PO Take by mouth as needed.   Yes [provider]  levETIRAcetam (KEPPRA)  500 MG tablet 250mg  in am, 500mg  in  pm. 01/21/18  Yes Dennie Bible, NP  lisinopril (PRINIVIL,ZESTRIL) 40 MG tablet Take 1 tablet (40 mg total) by mouth daily. 10/21/17  Yes Jeffery, Chelle, PA-C  Multiple Vitamins-Minerals (MULTIVITAMINS THER. W/MINERALS) TABS Take 1 tablet by mouth daily.     Yes [provider]   Social History   Socioeconomic History  . Marital status: Married    Spouse name: Owens Shark, estranged 07/2016  . Number of children: 2  . Years of education: 95  . Highest education level: Not on file  Occupational History  . Occupation: COOK      Employer: [employer]: full time  . 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 Financial resource strain: Not on file  . Food insecurity:    Worry: Not on file    Inability: Not on file  . Transportation needs:    Medical: Not on file    Non-medical: Not on file  Tobacco Use  . Smoking status: Never Smoker  . Smokeless tobacco: Never Used  Substance and Sexual Activity  . Alcohol use: No    Alcohol/week: 0.0 oz  . Drug use: No  . Sexual activity: Yes    Birth control/protection: Surgical  Lifestyle  . Physical activity:    Days per week: Not on file    Minutes per session: Not on file  . Stress: Not on file  Relationships  . Social connections:    Talks on phone: Not on file    Gets together: Not on file    Attends religious service: Not on file    Active member of club or organization: Not on file    Attends meetings of clubs or organizations: Not on file    Relationship status: Not on file  . Intimate partner violence:    Fear of current or ex partner: Not on file    Emotionally abused: Not on file    Physically abused: Not on file    Forced sexual activity: Not on file  Other Topics Concern  . Not on file  Social History Narrative   Patient lives with her sister since separating from her husband 07/2016.   Patient works full time.   Right handed.    Caffeine- three sodas daily.      Smoke detectors in home? Yes    Guns in home? No    Consistent seatbelt use? Yes    Consistent dental brushing and flossing? Yes    Semi-Annual dental visits: Yes    Annual Eye exams: Yes     Review of Systems  Constitutional: Negative for fever.  HENT: Positive for sneezing (with allergies. ). Negative for sinus pressure and sinus pain.   Eyes: Positive for photophobia. Negative for pain, redness and visual disturbance.  Respiratory: Negative for cough.   Musculoskeletal: Negative for neck stiffness.  Skin: Negative  for rash.  Neurological: Positive for headaches. Negative for dizziness, tremors, seizures, syncope, facial asymmetry, speech difficulty, weakness, light-headedness and numbness.       Objective:   Physical Exam  Constitutional: She is oriented to person, place, and time. She appears well-developed and well-nourished. No distress.  HENT:  Head: Normocephalic and atraumatic.  Right Ear: External ear normal.  Left Ear: External ear normal.  Nose: Nose normal.  Mouth/Throat: No oropharyngeal exudate.  Eyes: Pupils are equal, round, and reactive to light. EOM are normal.  Cardiovascular: Normal rate.  Pulmonary/Chest: Effort normal.  Neurological: She is alert and oriented to person, place, and time. Coordination normal.  No pronator drift, nonfocal neuro exam  Psychiatric: She has a normal mood and affect.   Vitals:   06/20/18 1435 06/20/18 1437  BP: (!) 168/78 (!) 160/80  Pulse: 75   Temp: 98.3 F (36.8 C)   TempSrc: Oral   SpO2: 97%   Weight: 177 lb 9.6 oz (80.6 kg)   Height: 5' 4.5" (1.638 m)       Assessment & Plan:    MODUPE SHAMPINE is a 57 y.o. female Right temporal headache - Plan: Sedimentation Rate, cyclobenzaprine (FLEXERIL) 5 MG tablet  History of migraine - Plan: Sedimentation Rate, cyclobenzaprine (FLEXERIL) 5 MG tablet  TMJ syndrome - Plan: cyclobenzaprine (FLEXERIL) 5 MG tablet  Possible migraine headache, with combination of  TMJ syndrome.  Less likely temporal arteritis.  Sed rate obtained, start Flexeril for acute treatment with potential side effects discussed.  Tylenol if needed, rest and fluids discussed.  RTC precautions given if acute worsening, follow-up with primary care provider as planned. Elevated blood pressure thought to be due to headache.  Also recommended to have this checked by PCP at upcoming follow-up.  ER precautions if new/worsening symptoms. Meds ordered this encounter  Medications  . cyclobenzaprine (FLEXERIL) 5 MG tablet     Sig: Take 1 tablet (5 mg total) by mouth 3 (three) times daily as needed. 1 pill by mouth up to every 8 hours as needed. Start with one pill by mouth each bedtime as needed due to sedation    Dispense:  30 tablet    Refill:  0   Patient Instructions   Tylenol up to 4 times per day for acute headache.   Rest, and drink plenty of fluids.  Flexeril every 8 hours as needed, but be careful with that medication due to sedation and risk of falls.  I will check a sed rate to rule out more concerning kind of headache. Current headache may be due to migraine, TMJ, blood pressure or combination.   Keep follow up with primary care provider in next 2-3 days for follow up of headache.   Return to the clinic or go to the nearest emergency room if any of your symptoms worsen or new symptoms occur.    General Headache Without Cause A headache is pain or discomfort felt around the head or neck area. There are many causes and types of headaches. In some cases, the cause may not be found. Follow these instructions at home: Managing pain  Take over-the-counter and prescription medicines only as told by your doctor.  Lie down in a dark, quiet room when you have a headache.  If directed, apply ice to the head and neck area: ? Put ice in a plastic bag. ? Place a towel between your skin and the bag. ? Leave the ice on for 20 minutes, 2-3 times per day.  Use a heating pad or hot shower to apply heat to the head and neck area as told by your doctor.  Keep lights dim if bright lights bother you or make your headaches worse. Eating and drinking  Eat meals on a regular schedule.  Lessen how much alcohol you drink.  Lessen how much caffeine you drink, or stop drinking caffeine. General instructions  Keep all follow-up visits as told by your doctor. This is important.  Keep a journal to find out if certain things bring on  headaches. For example, write down: ? What you eat and drink. ? How much sleep you  get. ? Any change to your diet or medicines.  Relax by getting a massage or doing other relaxing activities.  Lessen stress.  Sit up straight. Do not tighten (tense) your muscles.  Do not use tobacco products. This includes cigarettes, chewing tobacco, or e-cigarettes. If you need help quitting, ask your doctor.  Exercise regularly as told by your doctor.  Get enough sleep. This often means 7-9 hours of sleep. Contact a doctor if:  Your symptoms are not helped by medicine.  You have a headache that feels different than the other headaches.  You feel sick to your stomach (nauseous) or you throw up (vomit).  You have a fever. Get help right away if:  Your headache becomes really bad.  You keep throwing up.  You have a stiff neck.  You have trouble seeing.  You have trouble speaking.  You have pain in the eye or ear.  Your muscles are weak or you lose muscle control.  You lose your balance or have trouble walking.  You feel like you will pass out (faint) or you pass out.  You have confusion. This information is not intended to replace advice given to you by your health care provider. Make sure you discuss any questions you have with your health care provider. Document Released: 08/13/2008 Document Revised: 04/11/2016 Document Reviewed: 02/27/2015 Elsevier Interactive Patient Education  2018 [employer].     IF you received an x-ray today, you will receive an invoice from [facility]. Please contact [facility] at [phone] with questions or concerns regarding your invoice.   IF you received labwork today, you will receive an invoice from [facility]. Please contact [facility] at [phone] with questions or concerns regarding your invoice.   Our billing staff will not be able to assist you with questions regarding bills from these companies.  You will be contacted with the lab results as soon as they are available. The fastest way to get your  results is to activate your My Chart account. Instructions are located on the last page of this paperwork. If you have not heard from [place] regarding the results in 2 weeks, please contact this office.       Signed,   Merri Ray, MD [facility].  06/24/18 4:51 PM

## 2018-06-21 LAB — SEDIMENTATION RATE: Sed Rate: 15 mm/hr (ref 0–40)

## 2018-06-23 DIAGNOSIS — R51 Headache: Secondary | ICD-10-CM | POA: Diagnosis not present

## 2018-06-23 DIAGNOSIS — G43909 Migraine, unspecified, not intractable, without status migrainosus: Secondary | ICD-10-CM | POA: Diagnosis not present

## 2018-06-23 DIAGNOSIS — E785 Hyperlipidemia, unspecified: Secondary | ICD-10-CM | POA: Diagnosis not present

## 2018-06-23 DIAGNOSIS — I10 Essential (primary) hypertension: Secondary | ICD-10-CM | POA: Diagnosis not present

## 2018-06-24 ENCOUNTER — Encounter: Payer: Self-pay | Admitting: Family Medicine

## 2018-07-31 DIAGNOSIS — I10 Essential (primary) hypertension: Secondary | ICD-10-CM | POA: Diagnosis not present

## 2018-07-31 DIAGNOSIS — Z23 Encounter for immunization: Secondary | ICD-10-CM | POA: Diagnosis not present

## 2018-07-31 DIAGNOSIS — R29898 Other symptoms and signs involving the musculoskeletal system: Secondary | ICD-10-CM | POA: Diagnosis not present

## 2018-07-31 DIAGNOSIS — E785 Hyperlipidemia, unspecified: Secondary | ICD-10-CM | POA: Diagnosis not present

## 2018-07-31 DIAGNOSIS — M79601 Pain in right arm: Secondary | ICD-10-CM | POA: Diagnosis not present

## 2018-08-01 LAB — GLUCOSE, POCT (MANUAL RESULT ENTRY): POC GLUCOSE: 104 mg/dL — AB (ref 70–99)

## 2018-08-05 DIAGNOSIS — M5412 Radiculopathy, cervical region: Secondary | ICD-10-CM | POA: Diagnosis not present

## 2018-08-05 DIAGNOSIS — M5031 Other cervical disc degeneration,  high cervical region: Secondary | ICD-10-CM | POA: Diagnosis not present

## 2018-08-21 DIAGNOSIS — R110 Nausea: Secondary | ICD-10-CM | POA: Diagnosis not present

## 2018-08-21 DIAGNOSIS — I10 Essential (primary) hypertension: Secondary | ICD-10-CM | POA: Diagnosis not present

## 2018-08-21 DIAGNOSIS — R1032 Left lower quadrant pain: Secondary | ICD-10-CM | POA: Diagnosis not present

## 2018-08-21 DIAGNOSIS — R82998 Other abnormal findings in urine: Secondary | ICD-10-CM | POA: Diagnosis not present

## 2018-10-27 DIAGNOSIS — R682 Dry mouth, unspecified: Secondary | ICD-10-CM | POA: Diagnosis not present

## 2018-10-27 DIAGNOSIS — R42 Dizziness and giddiness: Secondary | ICD-10-CM | POA: Diagnosis not present

## 2018-10-27 DIAGNOSIS — K219 Gastro-esophageal reflux disease without esophagitis: Secondary | ICD-10-CM | POA: Diagnosis not present

## 2018-10-27 DIAGNOSIS — R110 Nausea: Secondary | ICD-10-CM | POA: Diagnosis not present

## 2018-11-03 DIAGNOSIS — M25562 Pain in left knee: Secondary | ICD-10-CM | POA: Diagnosis not present

## 2018-11-03 DIAGNOSIS — M1712 Unilateral primary osteoarthritis, left knee: Secondary | ICD-10-CM | POA: Diagnosis not present

## 2018-11-03 DIAGNOSIS — M67911 Unspecified disorder of synovium and tendon, right shoulder: Secondary | ICD-10-CM | POA: Diagnosis not present

## 2018-11-04 DIAGNOSIS — R6884 Jaw pain: Secondary | ICD-10-CM | POA: Diagnosis not present

## 2018-11-04 DIAGNOSIS — H8111 Benign paroxysmal vertigo, right ear: Secondary | ICD-10-CM | POA: Diagnosis not present

## 2018-11-16 DIAGNOSIS — H8111 Benign paroxysmal vertigo, right ear: Secondary | ICD-10-CM | POA: Diagnosis not present

## 2018-11-16 DIAGNOSIS — R6884 Jaw pain: Secondary | ICD-10-CM | POA: Diagnosis not present

## 2018-12-02 DIAGNOSIS — H9201 Otalgia, right ear: Secondary | ICD-10-CM | POA: Diagnosis not present

## 2018-12-02 DIAGNOSIS — I10 Essential (primary) hypertension: Secondary | ICD-10-CM | POA: Diagnosis not present

## 2018-12-02 DIAGNOSIS — E785 Hyperlipidemia, unspecified: Secondary | ICD-10-CM | POA: Diagnosis not present

## 2018-12-02 DIAGNOSIS — R569 Unspecified convulsions: Secondary | ICD-10-CM | POA: Diagnosis not present

## 2018-12-15 DIAGNOSIS — M25562 Pain in left knee: Secondary | ICD-10-CM | POA: Diagnosis not present

## 2018-12-15 DIAGNOSIS — M25462 Effusion, left knee: Secondary | ICD-10-CM | POA: Diagnosis not present

## 2019-01-05 DIAGNOSIS — M25562 Pain in left knee: Secondary | ICD-10-CM | POA: Diagnosis not present

## 2019-01-05 DIAGNOSIS — M25561 Pain in right knee: Secondary | ICD-10-CM | POA: Diagnosis not present

## 2019-01-11 DIAGNOSIS — M25561 Pain in right knee: Secondary | ICD-10-CM | POA: Diagnosis not present

## 2019-01-19 DIAGNOSIS — S83281A Other tear of lateral meniscus, current injury, right knee, initial encounter: Secondary | ICD-10-CM | POA: Diagnosis not present

## 2019-01-25 ENCOUNTER — Ambulatory Visit: Payer: BLUE CROSS/BLUE SHIELD | Admitting: Nurse Practitioner

## 2019-01-27 ENCOUNTER — Other Ambulatory Visit: Payer: Self-pay

## 2019-01-27 ENCOUNTER — Ambulatory Visit: Payer: BLUE CROSS/BLUE SHIELD | Admitting: Neurology

## 2019-01-27 ENCOUNTER — Encounter: Payer: Self-pay | Admitting: Neurology

## 2019-01-27 VITALS — BP 141/79 | HR 71 | Ht 64.5 in | Wt 178.0 lb

## 2019-01-27 DIAGNOSIS — R569 Unspecified convulsions: Secondary | ICD-10-CM | POA: Diagnosis not present

## 2019-01-27 MED ORDER — LEVETIRACETAM 500 MG PO TABS: ORAL_TABLET | ORAL | 4 refills | Status: DC

## 2019-01-27 NOTE — Progress Notes (Signed)
[facility]  PATIENT: Hannah Nguyen DOB: April 24, 1961   REASON FOR VISIT: Follow-up for seizure disorder, migraines HISTORY FROM: Patient    HISTORY OF PRESENT ILLNESS    She has past medical history of hypertension, hyperlipidemia, migraine headaches, presenting with passing out episode in January 29, 2010.   she was with her family at home, after watching TV for a while on January 29 2010, she decided to fix something for her child, she remember walking to the kitchen, the next thing, she woke up on the floor, EMS were there, all rounding her, she apparently had fell, lost consciousness,, there was no evidence she had hurt herself, no evidence of trauma, urinary incontinence, there was no tongue biting. Family started trying to arouse her by rubbing her hands, shaking her, it took 3-5 minutes before she was able to be aroused completely.  She was admitted to hospital on January 29 2010, following her first episode, she was started on Keppra 500 mg twice a day, but she is not taking it regularly. Echocardiogram showed ejection fraction 60-65%, EKG and telemetry monitoring was normal, CT scan of the head was normal.  Since the initial episode, she had episode of uncontrollable right arm shaking, lasting few minutes, without loss of consciousness, she often feel hot, with elevated blood pressure during the episode, this can happen 3 times or even f more times in a week.  Since Keppra was started, she has much less recurrent episode, and denies any since last seen 08/07/11. Her episode in the past described as stereotypical intermittent right arm uncontrollable shaking, lasting 2-3 minutes, not under her voluntary controlled, no associated right face or right lower extremity shaking. Keppra make her drowsy. EEG showed intermittent right central slowing, occasionally right P4 sharp transient. MRI of the brain has demonstrated supratentorial small vessel disease,atrophy more than  expected for her age.   UPDATE January 27 2019: Her primary care physician is PA Jacqulynn Cadet, [place], she sees her every 2-3 months, she works at [employer], drives  She complains of intermittent bilateral feet paresthesia after prolonged standing, frequent calf muscle cramps.   She also complains of low back pain for 2 years, worsening urinary urgency.  She complains of right knee pain, is evaluated by orthopedic currently,  REVIEW OF SYSTEMS: Full 14 system review of systems performed and notable only for those listed, all others are neg:  Incontinence of bladder, numbness, joint pain, walking difficulty,   ALLERGIES: Allergies  Allergen Reactions  . Vicodin [Hydrocodone-Acetaminophen] Hives and Itching    HOME MEDICATIONS: Outpatient Medications Prior to Visit  Medication Sig Dispense Refill  . atorvastatin (LIPITOR) 20 MG tablet TAKE 1 TABLET(20 MG) BY MOUTH DAILY AFTER SUPPER 60 tablet 0  . azelastine (ASTELIN) 0.1 % nasal spray Place 2 sprays into both nostrils 2 (two) times daily. Use in each nostril as directed 30 mL 2  . benzonatate (TESSALON) 100 MG capsule Take 1-2 capsules (100-200 mg total) by mouth 3 (three) times daily as needed for cough. 40 capsule 0  . Cetirizine HCl (ZYRTEC ALLERGY PO) Take by mouth.    . cyclobenzaprine (FLEXERIL) 5 MG tablet Take 1 tablet (5 mg total) by mouth 3 (three) times daily as needed. 1 pill by mouth up to every 8 hours as needed. Start with one pill by mouth each bedtime as needed due to sedation 30 tablet 0  . IBUPROFEN PO Take by mouth as needed.    . levETIRAcetam (KEPPRA) 500 MG tablet 250mg   in am, 500mg  in  pm. 135 tablet 3  . lisinopril (PRINIVIL,ZESTRIL) 40 MG tablet Take 1 tablet (40 mg total) by mouth daily. 90 tablet 3  . Multiple Vitamins-Minerals (MULTIVITAMINS THER. W/MINERALS) TABS Take 1 tablet by mouth daily.       No facility-administered medications prior to visit.     PAST MEDICAL HISTORY: Past Medical History:   Diagnosis Date  . Bartholin's gland cyst 10/22/2011  . Elevated cholesterol   . GERD (gastroesophageal reflux disease)   . Hypertension   . Kidney infection 05/2015  . Leiomyoma of uterus 10/22/2011  . Migraine   . Nephrolithiasis   . Other and unspecified hyperlipidemia   . Seizures ([place])    2 months ago was last episode of uncontrollable rt arm motions and passed out  . Spinal stenosis 11/2014   L4-L5, L leg pain    PAST SURGICAL HISTORY: Past Surgical History:  Procedure Laterality Date  . ABDOMINAL HYSTERECTOMY    . CESAREAN SECTION     x2  . EYE SURGERY     cataracts, both eyes   . KNEE ARTHROSCOPY     x2  . LITHOTRIPSY    . ROBOTIC ASSISTED LAP VAGINAL HYSTERECTOMY  10/25/2011   Procedure: ROBOTIC ASSISTED LAPAROSCOPIC VAGINAL HYSTERECTOMY;  Surgeon: Agnes Lawrence, MD;  Location: [facility];  Service: Gynecology;  Laterality: N/A;  Marsupralization og bartholin gland, cyst biopsy  . thumb surg      FAMILY HISTORY: Family History  Problem Relation Age of Onset  . Diabetes Mother   . Hyperlipidemia Mother   . Hypertension Mother   . Breast cancer Mother 16       s/p bilateral mastectomy  . Diabetes Father   . Hyperlipidemia Father   . Hypertension Father   . Hypertension Sister   . Bipolar disorder Daughter   . Asthma Daughter   . Diabetes Daughter   . Bipolar disorder Son     SOCIAL HISTORY: Social History   Socioeconomic History  . Marital status: Married    Spouse name: Owens Shark, estranged 07/2016  . Number of children: 2  . Years of education: 57  . Highest education level: Not on file  Occupational History  . Occupation: COOK    Employer: [employer]: full time  . 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 Financial resource strain: Not on file  . Food insecurity:    Worry: Not on file    Inability: Not on file  . Transportation needs:    Medical: Not on file    Non-medical: Not on file   Tobacco Use  . Smoking status: Never Smoker  . Smokeless tobacco: Never Used  Substance and Sexual Activity  . Alcohol use: No    Alcohol/week: 0.0 standard drinks  . Drug use: No  . Sexual activity: Yes    Birth control/protection: Surgical  Lifestyle  . Physical activity:    Days per week: Not on file    Minutes per session: Not on file  . Stress: Not on file  Relationships  . Social connections:    Talks on phone: Not on file    Gets together: Not on file    Attends religious service: Not on file    Active member of club or organization: Not on file    Attends meetings of clubs or organizations: Not on file    Relationship status: Not on file  .  Intimate partner violence:    Fear of current or ex partner: Not on file    Emotionally abused: Not on file    Physically abused: Not on file    Forced sexual activity: Not on file  Other Topics Concern  . Not on file  Social History Narrative   Patient lives with her sister since separating from her husband 07/2016.   Patient works full time.   Right handed.    Caffeine- three sodas daily.      Smoke detectors in home? Yes    Guns in home? No    Consistent seatbelt use? Yes    Consistent dental brushing and flossing? Yes    Semi-Annual dental visits: Yes    Annual Eye exams: Yes      PHYSICAL EXAM  Vitals:   01/27/19 0909  BP: (!) 141/79  Pulse: 71  Weight: 178 lb (80.7 kg)  Height: 5' 4.5" (1.638 m)   Body mass index is 30.08 kg/m. Generalized: Well developed, obese female in no acute distress  Head: normocephalic and atraumatic,. Oropharynx benign  Musculoskeletal: No deformity   Neurological examination  Mentation: Alert oriented to time, place, history taking. Attention span and concentration appropriate. Recent and remote memory intact. Follows all commands speech and language fluent.  Cranial nerve II-XII: Pupils were equal round reactive to light extraocular movements were full, visual field were  full on confrontational test. Facial sensation and strength were normal. hearing was intact to finger rubbing bilaterally. Uvula tongue midline. head turning and shoulder shrug were normal and symmetric.Tongue protrusion into cheek strength was normal. Motor: normal bulk and tone, full strength in the BUE, BLE, fine finger movements normal, no pronator drift. No focal weakness Coordination: finger-nose-finger, heel-to-shin bilaterally, no dysmetria, no tremor Reflexes: Symmetric upper and lower, plantar responses were flexor bilaterally. Gait and Station: Rising up from seated position without assistance, normal stance, moderate stride, good arm swing, smooth turning, able to perform tiptoe, and heel walking without difficulty. Tandem gait is unsteady. No assistive device Romberg negative   DIAGNOSTIC DATA (LABS, IMAGING, TESTING) - I reviewed patient records, labs, notes, testing and imaging myself where available.  Lab Results  Component Value Date   WBC 6.6 04/17/2018   HGB 14.0 04/17/2018   HCT 41.8 04/17/2018   MCV 94 04/17/2018   PLT 280 04/17/2018      Component Value Date/Time   NA 143 04/17/2018 1219   K 4.3 04/17/2018 1219   CL 103 04/17/2018 1219   CO2 25 04/17/2018 1219   GLUCOSE 83 04/17/2018 1219   GLUCOSE 81 03/28/2016 0909   BUN 12 04/17/2018 1219   CREATININE 0.81 04/17/2018 1219   CREATININE 0.73 03/28/2016 0909   CALCIUM 9.7 04/17/2018 1219   PROT 7.3 04/17/2018 1219   ALBUMIN 4.4 04/17/2018 1219   AST 30 04/17/2018 1219   ALT 36 (H) 04/17/2018 1219   ALKPHOS 119 (H) 04/17/2018 1219   BILITOT 0.4 04/17/2018 1219   GFRNONAA 82 04/17/2018 1219   GFRNONAA >89 12/18/2015 0808   GFRAA 95 04/17/2018 1219   GFRAA >89 12/18/2015 0808   Lab Results  Component Value Date   CHOL 177 04/17/2018   HDL 56 04/17/2018   LDLCALC 109 (H) 04/17/2018   TRIG 59 04/17/2018   CHOLHDL 3.2 04/17/2018    No results found for: [id number] Lab Results  Component Value Date    TSH 0.690 04/17/2018   ASSESSMENT AND PLAN:  Complex partial seizure  Continue Keppra 500 mg  half tablets in the morning, 1 tablet at nighttime,  Refilled her prescription  She may  continue refill by her primary care physician, only return to clinic for new issues.  Marcial Pacas, M.D. Ph.D.  [facility] [place], [place] [address] Phone: [phone] Fax:      [phone]

## 2019-03-15 DIAGNOSIS — J301 Allergic rhinitis due to pollen: Secondary | ICD-10-CM | POA: Diagnosis not present

## 2019-03-15 DIAGNOSIS — I10 Essential (primary) hypertension: Secondary | ICD-10-CM | POA: Diagnosis not present

## 2019-03-15 DIAGNOSIS — R569 Unspecified convulsions: Secondary | ICD-10-CM | POA: Diagnosis not present

## 2019-03-15 DIAGNOSIS — R05 Cough: Secondary | ICD-10-CM | POA: Diagnosis not present

## 2019-04-13 DIAGNOSIS — M67911 Unspecified disorder of synovium and tendon, right shoulder: Secondary | ICD-10-CM | POA: Diagnosis not present

## 2019-04-13 DIAGNOSIS — M79674 Pain in right toe(s): Secondary | ICD-10-CM | POA: Diagnosis not present

## 2019-04-13 DIAGNOSIS — M79671 Pain in right foot: Secondary | ICD-10-CM | POA: Diagnosis not present

## 2019-05-07 DIAGNOSIS — M1711 Unilateral primary osteoarthritis, right knee: Secondary | ICD-10-CM | POA: Diagnosis not present

## 2019-05-07 DIAGNOSIS — I10 Essential (primary) hypertension: Secondary | ICD-10-CM | POA: Diagnosis not present

## 2019-06-03 DIAGNOSIS — M25561 Pain in right knee: Secondary | ICD-10-CM | POA: Diagnosis not present

## 2019-06-03 DIAGNOSIS — S83281D Other tear of lateral meniscus, current injury, right knee, subsequent encounter: Secondary | ICD-10-CM | POA: Diagnosis not present

## 2019-06-08 DIAGNOSIS — I10 Essential (primary) hypertension: Secondary | ICD-10-CM | POA: Diagnosis not present

## 2019-06-08 DIAGNOSIS — M1711 Unilateral primary osteoarthritis, right knee: Secondary | ICD-10-CM | POA: Diagnosis not present

## 2019-08-11 DIAGNOSIS — Z1231 Encounter for screening mammogram for malignant neoplasm of breast: Secondary | ICD-10-CM | POA: Diagnosis not present

## 2019-08-11 DIAGNOSIS — Z0000 Encounter for general adult medical examination without abnormal findings: Secondary | ICD-10-CM | POA: Diagnosis not present

## 2019-08-11 DIAGNOSIS — I10 Essential (primary) hypertension: Secondary | ICD-10-CM | POA: Diagnosis not present

## 2019-08-11 DIAGNOSIS — Z23 Encounter for immunization: Secondary | ICD-10-CM | POA: Diagnosis not present

## 2019-08-11 DIAGNOSIS — Z6831 Body mass index (BMI) 31.0-31.9, adult: Secondary | ICD-10-CM | POA: Diagnosis not present

## 2019-08-11 DIAGNOSIS — Z131 Encounter for screening for diabetes mellitus: Secondary | ICD-10-CM | POA: Diagnosis not present

## 2019-08-11 DIAGNOSIS — E785 Hyperlipidemia, unspecified: Secondary | ICD-10-CM | POA: Diagnosis not present

## 2019-08-17 DIAGNOSIS — H524 Presbyopia: Secondary | ICD-10-CM | POA: Diagnosis not present

## 2019-08-23 ENCOUNTER — Other Ambulatory Visit: Payer: Self-pay | Admitting: Physician Assistant

## 2019-08-23 DIAGNOSIS — Z1231 Encounter for screening mammogram for malignant neoplasm of breast: Secondary | ICD-10-CM

## 2019-10-06 DIAGNOSIS — M23241 Derangement of anterior horn of lateral meniscus due to old tear or injury, right knee: Secondary | ICD-10-CM | POA: Diagnosis not present

## 2019-10-06 DIAGNOSIS — M94261 Chondromalacia, right knee: Secondary | ICD-10-CM | POA: Diagnosis not present

## 2019-10-06 DIAGNOSIS — M6751 Plica syndrome, right knee: Secondary | ICD-10-CM | POA: Diagnosis not present

## 2019-10-11 DIAGNOSIS — Z9889 Other specified postprocedural states: Secondary | ICD-10-CM | POA: Diagnosis not present

## 2019-10-12 ENCOUNTER — Ambulatory Visit: Payer: BC Managed Care – PPO | Admitting: Podiatry

## 2019-10-19 DIAGNOSIS — Z9889 Other specified postprocedural states: Secondary | ICD-10-CM | POA: Diagnosis not present

## 2019-10-19 DIAGNOSIS — M25461 Effusion, right knee: Secondary | ICD-10-CM | POA: Diagnosis not present

## 2019-10-21 DIAGNOSIS — M25662 Stiffness of left knee, not elsewhere classified: Secondary | ICD-10-CM | POA: Diagnosis not present

## 2019-10-21 DIAGNOSIS — R2689 Other abnormalities of gait and mobility: Secondary | ICD-10-CM | POA: Diagnosis not present

## 2019-10-27 DIAGNOSIS — M25662 Stiffness of left knee, not elsewhere classified: Secondary | ICD-10-CM | POA: Diagnosis not present

## 2019-10-27 DIAGNOSIS — R2689 Other abnormalities of gait and mobility: Secondary | ICD-10-CM | POA: Diagnosis not present

## 2019-11-02 DIAGNOSIS — R2689 Other abnormalities of gait and mobility: Secondary | ICD-10-CM | POA: Diagnosis not present

## 2019-11-02 DIAGNOSIS — M25662 Stiffness of left knee, not elsewhere classified: Secondary | ICD-10-CM | POA: Diagnosis not present

## 2019-11-04 DIAGNOSIS — R2689 Other abnormalities of gait and mobility: Secondary | ICD-10-CM | POA: Diagnosis not present

## 2019-11-04 DIAGNOSIS — M25662 Stiffness of left knee, not elsewhere classified: Secondary | ICD-10-CM | POA: Diagnosis not present

## 2019-11-09 DIAGNOSIS — M25662 Stiffness of left knee, not elsewhere classified: Secondary | ICD-10-CM | POA: Diagnosis not present

## 2019-11-09 DIAGNOSIS — R2689 Other abnormalities of gait and mobility: Secondary | ICD-10-CM | POA: Diagnosis not present

## 2019-11-17 DIAGNOSIS — I10 Essential (primary) hypertension: Secondary | ICD-10-CM | POA: Diagnosis not present

## 2019-11-17 DIAGNOSIS — M62830 Muscle spasm of back: Secondary | ICD-10-CM | POA: Diagnosis not present

## 2020-08-23 ENCOUNTER — Other Ambulatory Visit: Payer: Self-pay | Admitting: Orthopedic Surgery

## 2020-08-23 DIAGNOSIS — M25511 Pain in right shoulder: Secondary | ICD-10-CM

## 2021-10-15 ENCOUNTER — Other Ambulatory Visit: Payer: Self-pay

## 2021-10-15 ENCOUNTER — Ambulatory Visit
Admission: EM | Admit: 2021-10-15 | Discharge: 2021-10-15 | Disposition: A | Discharge status: Home / Self Care | Payer: 59

## 2021-10-15 ENCOUNTER — Encounter: Payer: Self-pay | Admitting: Emergency Medicine

## 2021-10-15 DIAGNOSIS — J069 Acute upper respiratory infection, unspecified: Secondary | ICD-10-CM

## 2021-10-15 MED ORDER — BENZONATATE 100 MG PO CAPS: 100.0000 mg | ORAL_CAPSULE | Freq: Three times a day (TID) | ORAL | 0 refills | Status: DC | PRN

## 2021-10-15 NOTE — ED Triage Notes (Signed)
Cough, nasal congestion, headache, sinus pressure starting after thanksgiving. Denies N/V/D, high fever, body aches

## 2021-10-15 NOTE — ED Provider Notes (Signed)
[name]    CSN: [id number] Arrival date & time: 10/15/21  9211      History   Chief Complaint Chief Complaint  Patient presents with   Cough    HPI Hannah Nguyen is a 60 y.o. female.   Patient presents with nonproductive cough, nasal congestion, headache that has been present for approximately 4 days now.  Patient denies any known sick contacts or fevers.  Denies chest pain, shortness of breath, sore throat, ear pain, nausea, vomiting, diarrhea, abdominal pain, body aches, chills.  Patient has taken Tylenol for symptoms.   Cough  Past Medical History:  Diagnosis Date   Bartholin's gland cyst 10/22/2011   Elevated cholesterol    GERD (gastroesophageal reflux disease)    Hypertension    Kidney infection 05/2015   Leiomyoma of uterus 10/22/2011   Migraine    Nephrolithiasis    Other and unspecified hyperlipidemia    Seizures (HCC)    2 months ago was last episode of uncontrollable rt arm motions and passed out   Spinal stenosis 11/2014   L4-L5, L leg pain    Patient Active Problem List   Diagnosis Date Noted   Allergic rhinitis 04/17/2018   Plantar fasciitis 03/01/2018   Mixed incontinence 10/21/2017   Nephrolithiasis 10/29/2016   Spinal stenosis of lumbar region 03/07/2015   BMI 30.0-30.9,adult 03/07/2015   HTN (hypertension) 01/31/2014   Hyperlipidemia 01/31/2014   Migraine    Partial seizure ([place]) 08/17/2012    Past Surgical History:  Procedure Laterality Date   ABDOMINAL HYSTERECTOMY     CESAREAN SECTION     x2   EYE SURGERY     cataracts, both eyes    KNEE ARTHROSCOPY     x2   LITHOTRIPSY     ROBOTIC ASSISTED LAP VAGINAL HYSTERECTOMY  10/25/2011   Procedure: ROBOTIC ASSISTED LAPAROSCOPIC VAGINAL HYSTERECTOMY;  Surgeon: Agnes Lawrence, MD;  Location: [facility];  Service: Gynecology;  Laterality: N/A;  Marsupralization og bartholin gland, cyst biopsy   thumb surg      OB History   No obstetric history on file.      Home  Medications    Prior to Admission medications   Medication Sig Start Date End Date Taking? Authorizing Provider  amLODipine (NORVASC) 5 MG tablet TAKE 1 TABLET(5 MG) BY MOUTH DAILY 04/24/21  Yes [provider]  atorvastatin (LIPITOR) 20 MG tablet Take by mouth. 04/24/21  Yes [provider]  benzonatate (TESSALON) 100 MG capsule Take 1 capsule (100 mg total) by mouth every 8 (eight) hours as needed for cough. 10/15/21  Yes Cleve Paolillo, Michele Rockers, FNP  cyclobenzaprine (FLEXERIL) 10 MG tablet Take by mouth. 08/11/20  Yes [provider]  levETIRAcetam (KEPPRA) 500 MG tablet Take by mouth. 04/24/21  Yes [provider]  lisinopril-hydrochlorothiazide (ZESTORETIC) 20-12.5 MG tablet Take 2 tablets by mouth daily. 08/22/21  Yes [provider]  atorvastatin (LIPITOR) 20 MG tablet TAKE 1 TABLET(20 MG) BY MOUTH DAILY AFTER SUPPER 03/31/18   Harrison Mons, PA  azelastine (ASTELIN) 0.1 % nasal spray Place 2 sprays into both nostrils 2 (two) times daily. Use in each nostril as directed 04/17/18   Harrison Mons, PA  Cetirizine HCl (ZYRTEC ALLERGY PO) Take by mouth.    [provider]  cyclobenzaprine (FLEXERIL) 5 MG tablet Take 1 tablet (5 mg total) by mouth 3 (three) times daily as needed. 1 pill by mouth up to every 8 hours as needed. Start with one pill by  mouth each bedtime as needed due to sedation 06/20/18   Wendie Agreste, MD  IBUPROFEN PO Take by mouth as needed.    [provider]  levETIRAcetam (KEPPRA) 500 MG tablet 250mg  in am, 500mg  in  pm. 01/27/19   Marcial Pacas, MD  lisinopril (PRINIVIL,ZESTRIL) 40 MG tablet Take 1 tablet (40 mg total) by mouth daily. 10/21/17   Harrison Mons, PA  Multiple Vitamins-Minerals (MULTIVITAMINS THER. W/MINERALS) TABS Take 1 tablet by mouth daily.      [provider]    Family History Family History  Problem Relation Age of Onset   Diabetes Mother    Hyperlipidemia Mother    Hypertension Mother     Breast cancer Mother 60       s/p bilateral mastectomy   Diabetes Father    Hyperlipidemia Father    Hypertension Father    Hypertension Sister    Bipolar disorder Daughter    Asthma Daughter    Diabetes Daughter    Bipolar disorder Son     Social History Social History   Tobacco Use   Smoking status: Never   Smokeless tobacco: Never  Substance Use Topics   Alcohol use: No    Alcohol/week: 0.0 standard drinks   Drug use: No     Allergies   Vicodin [hydrocodone-acetaminophen]   Review of Systems Review of Systems Per HPI  Physical Exam Triage Vital Signs ED Triage Vitals [10/15/21 1212]  Enc Vitals Group     BP (!) 161/83     Pulse Rate 87     Resp 16     Temp 97.9 F (36.6 C)     Temp Source Oral     SpO2 98 %     Weight      Height      Head Circumference      Peak Flow      Pain Score 0     Pain Loc      Pain Edu?      Excl. in [place]?    No data found.  Updated Vital Signs BP (!) 161/83 (BP Location: Left Arm)   Pulse 87   Temp 97.9 F (36.6 C) (Oral)   Resp 16   LMP 09/22/2011   SpO2 98%   Visual Acuity Right Eye Distance:   Left Eye Distance:   Bilateral Distance:    Right Eye Near:   Left Eye Near:    Bilateral Near:     Physical Exam Constitutional:      General: She is not in acute distress.    Appearance: Normal appearance. She is not toxic-appearing or diaphoretic.  HENT:     Head: Normocephalic and atraumatic.     Right Ear: Tympanic membrane and ear canal normal.     Left Ear: Tympanic membrane and ear canal normal.     Nose: Congestion present.     Mouth/Throat:     Mouth: Mucous membranes are moist.     Pharynx: No posterior oropharyngeal erythema.  Eyes:     Extraocular Movements: Extraocular movements intact.     Conjunctiva/sclera: Conjunctivae normal.     Pupils: Pupils are equal, round, and reactive to light.  Cardiovascular:     Rate and Rhythm: Normal rate and regular rhythm.     Pulses: Normal pulses.      Heart sounds: Normal heart sounds.  Pulmonary:     Effort: Pulmonary effort is normal. No respiratory distress.     Breath sounds: Normal  breath sounds. No stridor. No wheezing, rhonchi or rales.  Abdominal:     General: Abdomen is flat. Bowel sounds are normal.     Palpations: Abdomen is soft.  Musculoskeletal:        General: Normal range of motion.     Cervical back: Normal range of motion.  Skin:    General: Skin is warm and dry.  Neurological:     General: No focal deficit present.     Mental Status: She is alert and oriented to person, place, and time. Mental status is at baseline.  Psychiatric:        Mood and Affect: Mood normal.        Behavior: Behavior normal.     UC Treatments / Results  Labs (all labs ordered are listed, but only abnormal results are displayed) Labs Reviewed  COVID-19, FLU A+B NAA    EKG   Radiology No results found.  Procedures Procedures (including critical care time)  Medications Ordered in UC Medications - No data to display  Initial Impression / Assessment and Plan / UC Course  I have reviewed the triage vital signs and the nursing notes.  Pertinent labs & imaging results that were available during my care of the patient were reviewed by me and considered in my medical decision making (see chart for details).     Patient presents with symptoms likely from a viral upper respiratory infection. Differential includes bacterial pneumonia, sinusitis, allergic rhinitis, COVID-19, flu. Do not suspect underlying cardiopulmonary process. Symptoms seem unlikely related to ACS, CHF or COPD exacerbations, pneumonia, pneumothorax. Patient is nontoxic appearing and not in need of emergent medical intervention.  COVID-19 and flu test pending.  Recommended symptom control with over the counter medications.  Limited options on symptom management given patient's history of seizures.  Benzonatate prescribed to take as needed for cough.  Do not think  that chest imaging is necessary given no adventitious lung sounds on exam.  Return if symptoms fail to improve in 1-2 weeks or you develop shortness of breath, chest pain, severe headache. Patient states understanding and is agreeable.  Discharged with PCP followup.  Final Clinical Impressions(s) / UC Diagnoses   Final diagnoses:  Viral upper respiratory tract infection with cough     Discharge Instructions      It appears that you have a viral upper respiratory infection that should resolve in the next few days with symptomatic treatment.  You have been prescribed medication to take as needed for cough.  COVID-19 and flu test is pending.  We will call if it is positive.    ED Prescriptions     Medication Sig Dispense Auth. Provider   benzonatate (TESSALON) 100 MG capsule Take 1 capsule (100 mg total) by mouth every 8 (eight) hours as needed for cough. 21 capsule Harts, Michele Rockers, [place]      PDMP not reviewed this encounter.   Teodora Medici, [place] 10/15/21 1347

## 2021-10-15 NOTE — Discharge Instructions (Addendum)
It appears that you have a viral upper respiratory infection that should resolve in the next few days with symptomatic treatment.  You have been prescribed medication to take as needed for cough.  COVID-19 and flu test is pending.  We will call if it is positive.

## 2021-10-16 LAB — COVID-19, FLU A+B NAA
Influenza A, NAA: NOT DETECTED
Influenza B, NAA: NOT DETECTED
SARS-CoV-2, NAA: DETECTED — AB

## 2022-06-11 ENCOUNTER — Encounter: Payer: Self-pay | Admitting: Physician Assistant

## 2022-06-11 ENCOUNTER — Ambulatory Visit: Payer: No Typology Code available for payment source | Admitting: Physician Assistant

## 2022-06-11 VITALS — BP 155/95 | HR 70 | Resp 18 | Ht 65.0 in | Wt 175.0 lb

## 2022-06-11 DIAGNOSIS — Z131 Encounter for screening for diabetes mellitus: Secondary | ICD-10-CM

## 2022-06-11 DIAGNOSIS — Z872 Personal history of diseases of the skin and subcutaneous tissue: Secondary | ICD-10-CM | POA: Insufficient documentation

## 2022-06-11 DIAGNOSIS — B351 Tinea unguium: Secondary | ICD-10-CM | POA: Diagnosis not present

## 2022-06-11 DIAGNOSIS — I10 Essential (primary) hypertension: Secondary | ICD-10-CM | POA: Diagnosis not present

## 2022-06-11 LAB — POCT GLYCOSYLATED HEMOGLOBIN (HGB A1C): Hemoglobin A1C: 5.3 % (ref 4.0–5.6)

## 2022-06-11 NOTE — Progress Notes (Signed)
New Patient Office Visit  Subjective    Patient ID: Hannah Nguyen, female    DOB: 1961-04-27  Age: 61 y.o. MRN: [id number]  CC:  Chief Complaint  Patient presents with   Diabetes   Toe Pain    Ingrown toenails    HPI Hannah Nguyen request a referral to be seen by podiatry.  States that her current podiatrist moved to [place] and she does not want to travel that far.  States that she has history of ingrown toenails and thickened toenails.    Also requests a blood pressure check, states that she did not take her blood pressure medication yesterday.  States that she has been taking it at bedtime but does not like taking it because she wakes up several times throughout the night to urinate.  Denies any hypertensive symptoms.    Outpatient Encounter Medications as of 06/11/2022  Medication Sig   amLODipine (NORVASC) 5 MG tablet TAKE 1 TABLET(5 MG) BY MOUTH DAILY   atorvastatin (LIPITOR) 20 MG tablet TAKE 1 TABLET(20 MG) BY MOUTH DAILY AFTER SUPPER   atorvastatin (LIPITOR) 20 MG tablet Take by mouth.   azelastine (ASTELIN) 0.1 % nasal spray Place 2 sprays into both nostrils 2 (two) times daily. Use in each nostril as directed   benzonatate (TESSALON) 100 MG capsule Take 1 capsule (100 mg total) by mouth every 8 (eight) hours as needed for cough.   Cetirizine HCl (ZYRTEC ALLERGY PO) Take by mouth.   cyclobenzaprine (FLEXERIL) 5 MG tablet Take 1 tablet (5 mg total) by mouth 3 (three) times daily as needed. 1 pill by mouth up to every 8 hours as needed. Start with one pill by mouth each bedtime as needed due to sedation   levETIRAcetam (KEPPRA) 500 MG tablet '250mg'$  in am, '500mg'$  in  pm.   levETIRAcetam (KEPPRA) 500 MG tablet Take by mouth.   lisinopril-hydrochlorothiazide (ZESTORETIC) 20-12.5 MG tablet Take 2 tablets by mouth daily.   Multiple Vitamins-Minerals (MULTIVITAMINS THER. W/MINERALS) TABS Take 1 tablet by mouth daily.     cyclobenzaprine (FLEXERIL) 10 MG tablet  Take by mouth. (Patient not taking: Reported on 06/11/2022)   IBUPROFEN PO Take by mouth as needed. (Patient not taking: Reported on 06/11/2022)   lisinopril (PRINIVIL,ZESTRIL) 40 MG tablet Take 1 tablet (40 mg total) by mouth daily. (Patient not taking: Reported on 06/11/2022)   No facility-administered encounter medications on file as of 06/11/2022.    Past Medical History:  Diagnosis Date   Bartholin's gland cyst 10/22/2011   Elevated cholesterol    GERD (gastroesophageal reflux disease)    Hypertension    Kidney infection 05/2015   Leiomyoma of uterus 10/22/2011   Migraine    Nephrolithiasis    Other and unspecified hyperlipidemia    Seizures (HCC)    2 months ago was last episode of uncontrollable rt arm motions and passed out   Spinal stenosis 11/2014   L4-L5, L leg pain    Past Surgical History:  Procedure Laterality Date   ABDOMINAL HYSTERECTOMY     CESAREAN SECTION     x2   EYE SURGERY     cataracts, both eyes    KNEE ARTHROSCOPY     x2   LITHOTRIPSY     ROBOTIC ASSISTED LAP VAGINAL HYSTERECTOMY  10/25/2011   Procedure: ROBOTIC ASSISTED LAPAROSCOPIC VAGINAL HYSTERECTOMY;  Surgeon: Agnes Lawrence, MD;  Location: [facility];  Service: Gynecology;  Laterality: N/A;  Marsupralization og bartholin gland, cyst biopsy   thumb  surg      Family History  Problem Relation Age of Onset   Diabetes Mother    Hyperlipidemia Mother    Hypertension Mother    Breast cancer Mother 36       s/p bilateral mastectomy   Diabetes Father    Hyperlipidemia Father    Hypertension Father    Hypertension Sister    Bipolar disorder Daughter    Asthma Daughter    Diabetes Daughter    Bipolar disorder Son     Social History   Socioeconomic History   Marital status: Single    Spouse name: Owens Shark, estranged 07/2016   Number of children: 2   Years of education: 36   Highest education level: Not on file  Occupational History   Occupation: COOK    Employer: [employer]: full time   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   Patient works full time.   Right handed.    Caffeine- three sodas daily.      Smoke detectors in home? Yes    Guns in home? No    Consistent seatbelt use? Yes    Consistent dental brushing and flossing? Yes    Semi-Annual dental visits: Yes    Annual Eye exams: Yes    Social Determinants of Health   Financial Resource Strain: Not on file  Food Insecurity: Not on file  Transportation Needs: Not on file  Physical Activity: Not on file  Stress: Not on file  Social Connections: Not on file  Intimate Partner Violence: Not on file    Review of Systems  Constitutional: Negative.   HENT: Negative.    Eyes: Negative.   Respiratory:  Negative for shortness of breath.   Cardiovascular:  Negative for chest pain.  Gastrointestinal: Negative.   Genitourinary: Negative.   Musculoskeletal: Negative.   Skin: Negative.   Neurological: Negative.   Endo/Heme/Allergies: Negative.   Psychiatric/Behavioral: Negative.          Objective    BP (!) 155/95 (BP Location: Left Arm, Patient Position: Sitting, Cuff Size: Normal)   Pulse 70   Resp 18   Ht '5\' 5"'$  (1.651 m)   Wt 175 lb (79.4 kg)   LMP 09/22/2011   SpO2 98%   BMI 29.12 kg/m   Physical Exam Vitals and nursing note reviewed.  Constitutional:      Appearance: Normal appearance.  HENT:     Head: Normocephalic and atraumatic.     Right Ear: External ear normal.     Left Ear: External ear normal.     Nose: Nose normal.     Mouth/Throat:     Mouth: Mucous membranes are  moist.     Pharynx: Oropharynx is clear.  Eyes:     Extraocular Movements: Extraocular movements intact.     Conjunctiva/sclera: Conjunctivae normal.     Pupils: Pupils are equal, round, and reactive to light.  Cardiovascular:     Rate and Rhythm: Normal rate and regular rhythm.     Pulses: Normal  pulses.          Dorsalis pedis pulses are 2+ on the right side and 2+ on the left side.       Posterior tibial pulses are 2+ on the right side and 2+ on the left side.     Heart sounds: Normal heart sounds.  Pulmonary:     Effort: Pulmonary effort is normal.     Breath sounds: Normal breath sounds.  Musculoskeletal:        General: Normal range of motion.     Cervical back: Normal range of motion and neck supple.  Feet:     Right foot:     Toenail Condition: Right toenails are abnormally thick and long. Fungal disease present.    Left foot:     Toenail Condition: Left toenails are abnormally thick and long. Fungal disease present.    Comments: See photos Skin:    General: Skin is warm and dry.  Neurological:     General: No focal deficit present.     Mental Status: She is alert and oriented to person, place, and time.  Psychiatric:        Mood and Affect: Mood normal.        Thought Content: Thought content normal.        Judgment: Judgment normal.        Verbal consent given by patient to have photos included in chart     Assessment & Plan:   Problem List Items Addressed This Visit       Cardiovascular and Mediastinum   Elevated blood pressure reading in office with diagnosis of hypertension     Musculoskeletal and Integument   Onychomycosis - Primary   Relevant Orders   Ambulatory referral to Podiatry     Other   History of ingrown nail   Other Visit Diagnoses     Screening for diabetes mellitus (DM)       Relevant Orders   POCT glycosylated hemoglobin (Hb A1C) (Completed)      1. Onychomycosis Patient education given on supportive care. - Ambulatory  referral to Podiatry  2. Elevated blood pressure reading in office with diagnosis of hypertension Patient encouraged to move her blood pressure medication to morning time due to 25 mg of HCTZ medication.   Patient encouraged to check blood pressure at home, keep a written log and have available for all office visits.  Patient encouraged to maintain follow-up with primary care provider  Red flags given for prompt reevaluation  3. Screening for diabetes mellitus (DM) A1C 5.3 - POCT glycosylated hemoglobin (Hb A1C)  4. History of ingrown nail    I have reviewed the patient's medical history (PMH, PSH, Social History, Family History, Medications, and allergies) , and have been updated if relevant. I spent 30 minutes reviewing chart and  face to face time with patient.    Return if symptoms worsen or fail to improve.   Loraine Grip Mayers, PA-C

## 2022-06-11 NOTE — Patient Instructions (Signed)
Your A1C is 5.3, this is a normal range  I do encourage you to move your lisinopril-hydrochlorothiazide to the morning time.  I encourage you to increase your water intake, you should be drinking at least 64 ounces of water a day.  I have started a referral for you to be seen by podiatry to help you with your ingrown toenails.  Please let us know if there is anything else we can do for you  Kennieth Rad, PA-C Physician Assistant [email or web address]   Ingrown Toenail  An ingrown toenail occurs when the corner or sides of a toenail grow into the surrounding skin. This causes discomfort and pain. The big toe is most commonly affected, but any of the toes can be affected. If an ingrown toenail is not treated, it can become infected. What are the causes? This condition may be caused by: Wearing shoes that are too small or tight. An injury, such as stubbing your toe or having your toe stepped on. Improper cutting or care of your toenails. Having nail or foot abnormalities that were present from birth (congenital abnormalities), such as having a nail that is too big for your toe. What increases the risk? The following factors may make you more likely to develop ingrown toenails: Age. Nails tend to get thicker with age, so ingrown nails are more common among older people. Cutting your toenails incorrectly, such as cutting them very short or cutting them unevenly. An ingrown toenail is more likely to get infected if you have: Diabetes. Blood flow (circulation) problems. What are the signs or symptoms? Symptoms of an ingrown toenail may include: Pain, soreness, or tenderness. Redness. Swelling. Hardening of the skin that surrounds the toenail. Signs that an ingrown toenail may be infected include: Fluid or pus. Symptoms that get worse. How is this diagnosed? Ingrown toenails may be diagnosed based on: Your symptoms  and medical history. A physical exam. Labs or tests. If you have fluid or blood coming from your toenail, a sample may be collected to test for the specific type of bacteria that is causing the infection. How is this treated? Treatment depends on the severity of your symptoms. You may be able to care for your toenail at home. If you have an infection, you may be prescribed antibiotic medicines. If you have fluid or pus draining from your toenail, your health care provider may drain it. If you have trouble walking, you may be given crutches to use. If you have a severe or infected ingrown toenail, you may need a procedure to remove part or all of the nail. Follow these instructions at home: Crossgate your wound every day for signs of infection, or as often as told by your health care provider. Check for: More redness, swelling, or pain. More fluid or blood. Warmth. Pus or a bad smell. Do not pick at your toenail or try to remove it yourself. Soak your foot in warm, soapy water. Do this for 20 minutes, 3 times a day, or as often as told by your health care provider. This helps to keep your toe clean and your skin soft. Wear shoes that fit well and are not too tight. Your health care provider may recommend that you wear open-toed shoes while you heal. Trim your toenails regularly and carefully. Cut your toenails straight across to prevent injury to the skin at the corners of the toenail. Do not cut your nails in a curved shape. Keep your feet  clean and dry to help prevent infection. General instructions Take over-the-counter and prescription medicines only as told by your health care provider. If you were prescribed an antibiotic, take it as told by your health care provider. Do not stop taking the antibiotic even if you start to feel better. If your health care provider told you to use crutches to help you move around, use them as instructed. Return to your normal activities as told by  your health care provider. Ask your health care provider what activities are safe for you. Keep all follow-up visits. This is important. Contact a health care provider if: You have more redness, swelling, pain, or other symptoms that do not improve with treatment. You have fluid, blood, or pus coming from your toenail. You have a red streak on your skin that starts at your foot and spreads up your leg. You have a fever. Summary An ingrown toenail occurs when the corner or sides of a toenail grow into the surrounding skin. This causes discomfort and pain. The big toe is most commonly affected, but any of the toes can be affected. If an ingrown toenail is not treated, it can become infected. Fluid or pus draining from your toenail is a sign of infection. Your health care provider may need to drain it. You may be given antibiotics to treat the infection. Trimming your toenails regularly and properly can help you prevent an ingrown toenail. This information is not intended to replace advice given to you by your health care provider. Make sure you discuss any questions you have with your health care provider. Document Revised: 03/06/2021 Document Reviewed: 03/06/2021 Elsevier Patient Education  [place].

## 2022-06-24 ENCOUNTER — Ambulatory Visit: Payer: No Typology Code available for payment source | Admitting: Podiatry

## 2022-06-24 DIAGNOSIS — B351 Tinea unguium: Secondary | ICD-10-CM | POA: Diagnosis not present

## 2022-06-24 DIAGNOSIS — M79674 Pain in right toe(s): Secondary | ICD-10-CM

## 2022-06-24 DIAGNOSIS — M779 Enthesopathy, unspecified: Secondary | ICD-10-CM

## 2022-06-24 DIAGNOSIS — M722 Plantar fascial fibromatosis: Secondary | ICD-10-CM

## 2022-06-24 DIAGNOSIS — M79675 Pain in left toe(s): Secondary | ICD-10-CM

## 2022-06-24 NOTE — Progress Notes (Unsigned)
Subjective:   Patient ID: Hannah Nguyen, female   DOB: 61 y.o.   MRN: [id number]   HPI Chief Complaint  Patient presents with  . Nail Problem    Patient came in today for bilateral nail fungus, Patient nails are thick and yellow, which started 4 months ago, patient does have some pain when wearing shoes and would like a nail trim today,     Nails are getting thick and rubbing shoes and growing to the side. No swelling or drainage. She gets redness around the bed of the nail. This has been ongoing for several months. She use to get pedicures but given the thickening she has not.      ROS      Objective:  Physical Exam  ***     Assessment:  ***     Plan:  ***

## 2022-07-19 ENCOUNTER — Ambulatory Visit (INDEPENDENT_AMBULATORY_CARE_PROVIDER_SITE_OTHER): Payer: No Typology Code available for payment source | Admitting: *Deleted

## 2022-07-19 DIAGNOSIS — M722 Plantar fascial fibromatosis: Secondary | ICD-10-CM

## 2022-07-19 NOTE — Progress Notes (Signed)
Patient presents today to pick up custom molded foot orthotics, diagnosed with plantar fasciitis by Dr. Jacqualyn Posey.   Orthotics were dispensed and fit was satisfactory. Reviewed instructions for break-in and wear. Written instructions given to patient.  Patient will follow up as needed.   [facility] - order # [id number]

## 2023-07-09 ENCOUNTER — Ambulatory Visit: Payer: No Typology Code available for payment source | Admitting: Podiatry

## 2023-07-09 ENCOUNTER — Encounter: Payer: Self-pay | Admitting: Podiatry

## 2023-07-09 DIAGNOSIS — Q828 Other specified congenital malformations of skin: Secondary | ICD-10-CM

## 2023-07-09 DIAGNOSIS — M216X9 Other acquired deformities of unspecified foot: Secondary | ICD-10-CM

## 2023-07-10 NOTE — Progress Notes (Signed)
Subjective:   Patient ID: Hannah Nguyen, female   DOB: 62 y.o.   MRN: [id number]   HPI Patient presents stating she has had very painful lesions on both feet and states that she also feels like at times she is walking on bone structure.  Has had them worked on in the past   ROS      Objective:  Physical Exam  Neurovascular status intact muscle strength is adequate currently with the patient found to have prominent bone structure bilateral chronic lesion formation what appears to be third and fourth metatarsals bilateral plantar heel left lucent core is difficult to walk on     Assessment:  Very deep lesion formation with structural bone deformity associated with it     Plan:  H&P reviewed at this point I do think the consideration of elevating osteotomy may be necessary along with primary cutting of lesions out with wide excision technique.  Today I did do deep debridement of lesions courtesy we will see the results of this and then a decision can be made on a more aggressive approach to try to help her with education today rendered concerning that approach

## 2024-03-01 ENCOUNTER — Ambulatory Visit: Admitting: Podiatry

## 2024-03-01 ENCOUNTER — Encounter: Payer: Self-pay | Admitting: Podiatry

## 2024-03-01 DIAGNOSIS — M2042 Other hammer toe(s) (acquired), left foot: Secondary | ICD-10-CM | POA: Diagnosis not present

## 2024-03-01 DIAGNOSIS — Q828 Other specified congenital malformations of skin: Secondary | ICD-10-CM

## 2024-03-01 DIAGNOSIS — M2041 Other hammer toe(s) (acquired), right foot: Secondary | ICD-10-CM | POA: Diagnosis not present

## 2024-03-01 DIAGNOSIS — M216X9 Other acquired deformities of unspecified foot: Secondary | ICD-10-CM | POA: Diagnosis not present

## 2024-03-01 NOTE — Progress Notes (Signed)
 Patient present subjective:   Patient ID: Hannah Nguyen, female   DOB: 63 y.o.   MRN: [id number]   HPI Stating the bottom of both feet especially right have been bothering her and she is interested in surgery at 1 point in future.  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  We we will probably get that done at 1 point but she is gena hold off and today I did do courtesy debridement of lesion to take pressure off of it temporarily until we can do surgery with again education concerning procedures given in great detail

## 2024-07-26 ENCOUNTER — Ambulatory Visit: Admitting: Podiatry

## 2024-08-16 ENCOUNTER — Encounter: Payer: Self-pay | Admitting: Podiatry

## 2024-08-16 ENCOUNTER — Ambulatory Visit (INDEPENDENT_AMBULATORY_CARE_PROVIDER_SITE_OTHER): Admitting: Podiatry

## 2024-08-16 ENCOUNTER — Ambulatory Visit (INDEPENDENT_AMBULATORY_CARE_PROVIDER_SITE_OTHER)

## 2024-08-16 VITALS — Ht 65.0 in | Wt 175.0 lb

## 2024-08-16 DIAGNOSIS — M2041 Other hammer toe(s) (acquired), right foot: Secondary | ICD-10-CM

## 2024-08-16 DIAGNOSIS — M2042 Other hammer toe(s) (acquired), left foot: Secondary | ICD-10-CM

## 2024-08-16 DIAGNOSIS — D367 Benign neoplasm of other specified sites: Secondary | ICD-10-CM | POA: Diagnosis not present

## 2024-08-16 DIAGNOSIS — M216X9 Other acquired deformities of unspecified foot: Secondary | ICD-10-CM

## 2024-08-16 NOTE — Progress Notes (Signed)
 Subjective:   Patient ID: Hannah Nguyen, female   DOB: 63 y.o.   MRN: [id number]   HPI Patient states that her right foot is really bothering her and she feels like she is getting needs surgery and also needs a knee replacement   ROS      Objective:  Physical Exam  Neurovascular status intact severe keratotic lesion subthird metatarsal head right with elevated third toe creating pressure on this along with severe lesion that has been present for a number of years and has failed to respond to trimming padding shoe gear modifications     Assessment:  Plantarflexed metatarsal with digital deformity and chronic benign neoplasm plantar right     Plan:  H&P reviewed at great length discussed condition and at this point we reviewed options and she has opted for surgery as the other procedures have not helped her.  I have recommended elevating osteotomy and digital fusion to stabilize the toe and primary wide excision of the mass.  She wants to have this done understands there is no guarantee this will get it better and it can recur even with surgery and after extensive review she signed consent form after review.  Patient scheduled outpatient surgery and also needs to have a knee replacement which can be done several weeks after this procedure.  Patient had air fracture walker dispensed properly fitted to her lower leg with instructions on usage and will wear it prior to surgery in order to get used to it walking.

## 2024-08-23 ENCOUNTER — Telehealth: Payer: Self-pay | Admitting: Podiatry

## 2024-08-23 NOTE — Telephone Encounter (Signed)
 Called and left message for patient to contact office to get surgery scheduled with Dr. LOISE Ovens.

## 2024-09-15 NOTE — Telephone Encounter (Signed)
 Called patient as she had called in and stated she had not been called to schedule surgery yet. I reached her voicemail again and left her my direct line once again to contact as she had originally contacted the main office line and spoke with a call center agent.

## 2024-09-20 ENCOUNTER — Ambulatory Visit: Admitting: Podiatry

## 2024-10-07 ENCOUNTER — Telehealth: Payer: Self-pay | Admitting: Neurology

## 2024-10-07 NOTE — Telephone Encounter (Signed)
 Pt stopped in, was last seen by Dr. Onita 2020 and would like a letter stating that Dr. Onita has released her to her PCP. Pt is having sx in the new year and needs this so hospital is not looking for clearance from yan too. Please mail to pt or she can come in to [facility], just call her when it is ready. TY

## 2024-10-22 ENCOUNTER — Other Ambulatory Visit: Payer: Self-pay | Admitting: Urology

## 2024-11-01 NOTE — Patient Instructions (Signed)
 SURGICAL WAITING ROOM VISITATION  Patients having surgery or a procedure may have no more than 2 support people in the waiting area - these visitors may rotate.    Children ages 31 and under will not be able to visit patients in [facility] under most circumstances.   Visitors with respiratory illnesses are discouraged from visiting and should remain at home.  If the patient needs to stay at the hospital during part of their recovery, the visitor guidelines for inpatient rooms apply. Pre-op nurse will coordinate an appropriate time for 1 support person to accompany patient in pre-op.  This support person may not rotate.    Please refer to the [facility] website for the visitor guidelines for Inpatients (after your surgery is over and you are in a regular room).       Your procedure is scheduled on: 11/17/24   Report to [facility] Main Entrance    Report to admitting at 9:15 AM   Call this number if you have problems the morning of surgery [phone]   Do not eat food or drink liquids:After Midnight.but may have sips of water to take meds.    Oral Hygiene is also important to reduce your risk of infection.                                    Remember - BRUSH YOUR TEETH THE MORNING OF SURGERY WITH YOUR REGULAR TOOTHPASTE   Stop all vitamins and herbal supplements 7 days before surgery.   Take these medicines the morning of surgery with A SIP OF WATER: Amlodipine, atorvastatin , Keppra , montelukast(singulair)             You may not have any metal on your body including hair pins, jewelry, and body piercing             Do not wear make-up, lotions, powders, perfumes/cologne, or deodorant  Do not wear nail polish including gel and S&S, artificial/acrylic nails, or any other type of covering on natural nails including finger and toenails. If you have artificial nails, gel coating, etc. that needs to be removed by a nail salon please have this removed prior to  surgery or surgery may need to be canceled/ delayed if the surgeon/ anesthesia feels like they are unable to be safely monitored.   Do not shave  48 hours prior to surgery.               Do not bring valuables to the hospital. [facility] IS NOT             RESPONSIBLE   FOR VALUABLES.   Contacts, glasses, dentures or bridgework may not be worn into surgery.  DO NOT BRING YOUR HOME MEDICATIONS TO THE HOSPITAL. PHARMACY WILL DISPENSE MEDICATIONS LISTED ON YOUR MEDICATION LIST TO YOU DURING YOUR ADMISSION IN THE HOSPITAL!    Patients discharged on the day of surgery will not be allowed to drive home.  Someone NEEDS to stay with you for the first 24 hours after anesthesia.   Special Instructions: Bring a copy of your healthcare power of attorney and living will documents the day of surgery if you haven't scanned them before.              Please read over the following fact sheets you were given: IF YOU HAVE QUESTIONS ABOUT YOUR PRE-OP INSTRUCTIONS PLEASE CALL [phone] [facility]   If  you received a COVID test during your pre-op visit  it is requested that you wear a mask when out in public, stay away from anyone that may not be feeling well and notify your surgeon if you develop symptoms. If you test positive for Covid or have been in contact with anyone that has tested positive in the last 10 days please notify you surgeon.    [facility] - Preparing for Surgery Before surgery, you can play an important role.  Because skin is not sterile, your skin needs to be as free of germs as possible.  You can reduce the number of germs on your skin by washing with CHG (chlorahexidine gluconate) soap before surgery.  CHG is an antiseptic cleaner which kills germs and bonds with the skin to continue killing germs even after washing. Please DO NOT use if you have an allergy to CHG or antibacterial soaps.  If your skin becomes reddened/irritated stop using the CHG and inform your nurse when you arrive at  Short Stay. Do not shave (including legs and underarms) for at least 48 hours prior to the first CHG shower.  You may shave your face/neck.  Please follow these instructions carefully:  1.  Shower with CHG Soap the night before surgery and the morning of surgery.  2.  If you choose to wash your hair, wash your hair first as usual with your normal  shampoo.  3.  After you shampoo, rinse your hair and body thoroughly to remove the shampoo.                             4.  Use CHG as you would any other liquid soap.  You can apply chg directly to the skin and wash.  Gently with a scrungie or clean washcloth.  5.  Apply the CHG Soap to your body ONLY FROM THE NECK DOWN.   Do   not use on face/ open                           Wound or open sores. Avoid contact with eyes, ears mouth and   genitals (private parts).                       Wash face,  Genitals (private parts) with your normal soap.             6.  Wash thoroughly, paying special attention to the area where your    surgery  will be performed.  7.  Thoroughly rinse your body with warm water from the neck down.  8.  DO NOT shower/wash with your normal soap after using and rinsing off the CHG Soap.                9.  Pat yourself dry with a clean towel.            10.  Wear clean pajamas.            11.  Place clean sheets on your bed the night of your first shower and do not  sleep with pets. Day of Surgery : Do not apply any CHG, lotions/deodorants the morning of surgery.  Please wear clean clothes to the hospital/surgery center.  FAILURE TO FOLLOW THESE INSTRUCTIONS MAY RESULT IN THE CANCELLATION OF YOUR SURGERY  PATIENT SIGNATURE_________________________________  NURSE SIGNATURE__________________________________  ________________________________________________________________________

## 2024-11-01 NOTE — Progress Notes (Signed)
 COVID Vaccine received:  []  No [x]  Yes Date of any COVID positive Test in last 90 days: none PCP - Bernita Purchase PA Cardiologist - n/a Neurology- Modena Callander  Chest x-ray -  EKG -   Stress Test -  ECHO -  Cardiac Cath -   Bowel Prep - [x]  No  []   Yes ______  Pacemaker / ICD device [x]  No []  Yes   Spinal Cord Stimulator:[x]  No []  Yes       History of Sleep Apnea? [x]  No []  Yes   CPAP used?- [x]  No []  Yes    Does the patient monitor blood sugar?          [x]  No []  Yes  []  N/A  Patient has: [x]  NO Hx DM   []  Pre-DM                 []  DM1  []   DM2 Does patient have a [employer] or Dexacom? []  No []  Yes   Fasting Blood Sugar Ranges-  Checks Blood Sugar _____ times a day  GLP1 agonist / usual dose - no GLP1 instructions:  SGLT-2 inhibitors / usual dose - no SGLT-2 instructions:   Blood Thinner / Instructions:no Aspirin Instructions:no  Comments:   Activity level: Patient is able to climb a flight of stairs without difficulty; [x]  No CP  [x]  No SOB,    Patient can perform ADLs without assistance.   Anesthesia review: Seizures- none for 4 years per pt., HTN  Patient denies shortness of breath, fever, cough and chest pain at PAT appointment.  Patient verbalized understanding and agreement to the Pre-Surgical Instructions that were given to them at this PAT appointment. Patient was also educated of the need to review these PAT instructions again prior to his/her surgery.I reviewed the appropriate phone numbers to call if they have any and questions or concerns.

## 2024-11-02 ENCOUNTER — Encounter (HOSPITAL_COMMUNITY): Admission: RE | Admit: 2024-11-02 | Discharge: 2024-11-02 | Payer: Self-pay | Attending: Urology

## 2024-11-02 ENCOUNTER — Other Ambulatory Visit: Payer: Self-pay

## 2024-11-02 ENCOUNTER — Encounter (HOSPITAL_COMMUNITY): Payer: Self-pay

## 2024-11-02 VITALS — BP 122/73 | HR 75 | Temp 98.5°F | Resp 16 | Ht 64.0 in | Wt 173.0 lb

## 2024-11-02 DIAGNOSIS — I10 Essential (primary) hypertension: Secondary | ICD-10-CM | POA: Insufficient documentation

## 2024-11-02 DIAGNOSIS — Z01818 Encounter for other preprocedural examination: Secondary | ICD-10-CM | POA: Insufficient documentation

## 2024-11-02 LAB — BASIC METABOLIC PANEL WITH GFR
Anion gap: 9 (ref 5–15)
BUN: 20 mg/dL (ref 8–23)
CO2: 28 mmol/L (ref 22–32)
Calcium: 9.8 mg/dL (ref 8.9–10.3)
Chloride: 104 mmol/L (ref 98–111)
Creatinine, Ser: 0.82 mg/dL (ref 0.44–1.00)
GFR, Estimated: >60 mL/min (ref >60)
Glucose, Bld: 96 mg/dL (ref 70–99)
Potassium: 4.2 mmol/L (ref 3.5–5.1)
Sodium: 141 mmol/L (ref 135–145)

## 2024-11-02 LAB — CBC
HCT: 44.1 % (ref 36.0–46.0)
Hemoglobin: 14.4 g/dL (ref 12.0–15.0)
MCH: 31.6 pg (ref 26.0–34.0)
MCHC: 32.7 g/dL (ref 30.0–36.0)
MCV: 96.7 fL (ref 80.0–100.0)
Platelets: 289 K/uL (ref 150–400)
RBC: 4.56 MIL/uL (ref 3.87–5.11)
RDW: 13.2 % (ref 11.5–15.5)
WBC: 8 K/uL (ref 4.0–10.5)
nRBC: 0 % (ref 0.0–0.2)

## 2024-11-16 NOTE — Anesthesia Preprocedure Evaluation (Signed)
 "                                  Anesthesia Evaluation  Patient identified by MRN, date of birth, ID band Patient awake    Reviewed: Allergy & Precautions, NPO status , Patient's Chart, lab work & pertinent test results  History of Anesthesia Complications Negative for: history of anesthetic complications  Airway Mallampati: II  TM Distance: >3 FB Neck ROM: Full    Dental no notable dental hx. (+) Missing, Dental Advisory Given,    Pulmonary    Pulmonary exam normal breath sounds clear to auscultation       Cardiovascular hypertension, Pt. on medications (-) angina (-) Past MI Normal cardiovascular exam Rhythm:Regular Rate:Normal     Neuro/Psych  Headaches, Seizures -, Well Controlled,   Neuromuscular disease    GI/Hepatic ,GERD  ,,  Endo/Other    Renal/GU Renal diseaseLab Results      Component                Value               Date                           K                        4.2                 11/02/2024                CO2                      28                  11/02/2024                BUN                      20                  11/02/2024                CREATININE               0.82                11/02/2024                     Musculoskeletal   Abdominal   Peds  Hematology Lab Results      Component                Value               Date                      WBC                      8.0                 11/02/2024                HGB                      14.4  11/02/2024                HCT                      44.1                11/02/2024                MCV                      96.7                11/02/2024                PLT                      289                 11/02/2024              Anesthesia Other Findings All: Buspar, Vicodin  Reproductive/Obstetrics                              Anesthesia Physical Anesthesia Plan  ASA: 3  Anesthesia Plan: General   Post-op Pain  Management: Ofirmev  IV (intra-op)*   Induction: Intravenous  PONV Risk Score and Plan: 4 or greater and Treatment may vary due to age or medical condition, Midazolam , Dexamethasone  and Ondansetron   Airway Management Planned: LMA  Additional Equipment: None  Intra-op Plan:   Post-operative Plan: Extubation in OR  Informed Consent: I have reviewed the patients History and Physical, chart, labs and discussed the procedure including the risks, benefits and alternatives for the proposed anesthesia with the patient or authorized representative who has indicated his/her understanding and acceptance.     Dental advisory given  Plan Discussed with: CRNA and Surgeon  Anesthesia Plan Comments:          Anesthesia Quick Evaluation  "

## 2024-11-17 ENCOUNTER — Ambulatory Visit (HOSPITAL_COMMUNITY)
Admission: RE | Admit: 2024-11-17 | Discharge: 2024-11-17 | Disposition: A | Discharge status: Home / Self Care | Payer: Self-pay | Source: Ambulatory Visit | Attending: Urology | Admitting: Urology

## 2024-11-17 ENCOUNTER — Ambulatory Visit (HOSPITAL_COMMUNITY): Payer: Self-pay | Admitting: Medical

## 2024-11-17 ENCOUNTER — Ambulatory Visit (HOSPITAL_COMMUNITY)

## 2024-11-17 ENCOUNTER — Encounter (HOSPITAL_COMMUNITY): Payer: Self-pay | Admitting: Urology

## 2024-11-17 ENCOUNTER — Ambulatory Visit (HOSPITAL_COMMUNITY): Admitting: Anesthesiology

## 2024-11-17 ENCOUNTER — Encounter (HOSPITAL_COMMUNITY)
Admission: RE | Disposition: A | Discharge status: Home / Self Care | Payer: Self-pay | Source: Ambulatory Visit | Attending: Urology

## 2024-11-17 DIAGNOSIS — K219 Gastro-esophageal reflux disease without esophagitis: Secondary | ICD-10-CM | POA: Diagnosis not present

## 2024-11-17 DIAGNOSIS — I129 Hypertensive chronic kidney disease with stage 1 through stage 4 chronic kidney disease, or unspecified chronic kidney disease: Secondary | ICD-10-CM | POA: Diagnosis not present

## 2024-11-17 DIAGNOSIS — N200 Calculus of kidney: Secondary | ICD-10-CM | POA: Diagnosis not present

## 2024-11-17 DIAGNOSIS — N189 Chronic kidney disease, unspecified: Secondary | ICD-10-CM | POA: Insufficient documentation

## 2024-11-17 DIAGNOSIS — R319 Hematuria, unspecified: Secondary | ICD-10-CM | POA: Insufficient documentation

## 2024-11-17 DIAGNOSIS — G43909 Migraine, unspecified, not intractable, without status migrainosus: Secondary | ICD-10-CM | POA: Diagnosis not present

## 2024-11-17 DIAGNOSIS — R569 Unspecified convulsions: Secondary | ICD-10-CM | POA: Diagnosis not present

## 2024-11-17 DIAGNOSIS — N202 Calculus of kidney with calculus of ureter: Secondary | ICD-10-CM | POA: Diagnosis present

## 2024-11-17 HISTORY — PX: CYSTOSCOPY W/ RETROGRADES: SHX1426

## 2024-11-17 HISTORY — DX: Other complications of anesthesia, initial encounter: T88.59XA

## 2024-11-17 HISTORY — PX: CYSTOSCOPY/URETEROSCOPY/HOLMIUM LASER/STENT PLACEMENT: SHX6546

## 2024-11-17 SURGERY — CYSTOSCOPY/URETEROSCOPY/HOLMIUM LASER/STENT PLACEMENT
Anesthesia: General | Laterality: Right

## 2024-11-17 MED ORDER — FENTANYL CITRATE (PF) 100 MCG/2ML IJ SOLN
INTRAMUSCULAR | Status: AC
  Filled 2024-11-17: qty 2

## 2024-11-17 MED ORDER — PROPOFOL 10 MG/ML IV BOLUS
INTRAVENOUS | Status: DC | PRN
  Administered 2024-11-17: 120 mg via INTRAVENOUS

## 2024-11-17 MED ORDER — DEXAMETHASONE SODIUM PHOSPHATE 4 MG/ML IJ SOLN
INTRAMUSCULAR | Status: DC | PRN
  Administered 2024-11-17: 8 mg via INTRAVENOUS

## 2024-11-17 MED ORDER — GENTAMICIN SULFATE 40 MG/ML IJ SOLN
5.0000 mg/kg | INTRAVENOUS | Status: AC
  Administered 2024-11-17: 397.2 mg via INTRAVENOUS
  Filled 2024-11-17: qty 10

## 2024-11-17 MED ORDER — DROPERIDOL 2.5 MG/ML IJ SOLN
INTRAMUSCULAR | Status: AC
  Filled 2024-11-17: qty 2

## 2024-11-17 MED ORDER — ACETAMINOPHEN 10 MG/ML IV SOLN
INTRAVENOUS | Status: AC
  Filled 2024-11-17: qty 100

## 2024-11-17 MED ORDER — MIDAZOLAM HCL 5 MG/5ML IJ SOLN
INTRAMUSCULAR | Status: DC | PRN
  Administered 2024-11-17: 2 mg via INTRAVENOUS

## 2024-11-17 MED ORDER — ONDANSETRON HCL 4 MG/2ML IJ SOLN
INTRAMUSCULAR | Status: AC
  Filled 2024-11-17: qty 2

## 2024-11-17 MED ORDER — ACETAMINOPHEN 10 MG/ML IV SOLN
INTRAVENOUS | Status: DC | PRN
  Administered 2024-11-17: 1000 mg via INTRAVENOUS

## 2024-11-17 MED ORDER — OXYCODONE HCL 5 MG PO TABS
ORAL_TABLET | ORAL | Status: AC
  Filled 2024-11-17: qty 1

## 2024-11-17 MED ORDER — CEPHALEXIN 500 MG PO CAPS: 500.0000 mg | ORAL_CAPSULE | Freq: Two times a day (BID) | ORAL | 0 refills | Status: AC

## 2024-11-17 MED ORDER — OXYCODONE HCL 5 MG/5ML PO SOLN: 5.0000 mg | Freq: Once | ORAL | Status: AC | PRN

## 2024-11-17 MED ORDER — LIDOCAINE HCL (PF) 2 % IJ SOLN
INTRAMUSCULAR | Status: AC
  Filled 2024-11-17: qty 5

## 2024-11-17 MED ORDER — ORAL CARE MOUTH RINSE: 15.0000 mL | Freq: Once | OROMUCOSAL | Status: AC

## 2024-11-17 MED ORDER — FENTANYL CITRATE (PF) 50 MCG/ML IJ SOSY: 25.0000 ug | PREFILLED_SYRINGE | INTRAMUSCULAR | Status: DC | PRN

## 2024-11-17 MED ORDER — CHLORHEXIDINE GLUCONATE 0.12 % MT SOLN
15.0000 mL | Freq: Once | OROMUCOSAL | Status: AC
  Administered 2024-11-17: 15 mL via OROMUCOSAL

## 2024-11-17 MED ORDER — DROPERIDOL 2.5 MG/ML IJ SOLN
0.6250 mg | Freq: Once | INTRAMUSCULAR | Status: AC
  Administered 2024-11-17: 0.625 mg via INTRAVENOUS

## 2024-11-17 MED ORDER — LACTATED RINGERS IV SOLN: INTRAVENOUS | Status: DC

## 2024-11-17 MED ORDER — MIDAZOLAM HCL 2 MG/2ML IJ SOLN
INTRAMUSCULAR | Status: AC
  Filled 2024-11-17: qty 2

## 2024-11-17 MED ORDER — IOHEXOL 300 MG/ML  SOLN
INTRAMUSCULAR | Status: DC | PRN
  Administered 2024-11-17: 50 mL

## 2024-11-17 MED ORDER — FENTANYL CITRATE (PF) 100 MCG/2ML IJ SOLN
INTRAMUSCULAR | Status: DC | PRN
  Administered 2024-11-17: 50 ug via INTRAVENOUS
  Administered 2024-11-17 (×2): 25 ug via INTRAVENOUS

## 2024-11-17 MED ORDER — SODIUM CHLORIDE 0.9 % IR SOLN
Status: DC | PRN
  Administered 2024-11-17: 3000 mL

## 2024-11-17 MED ORDER — ACETAMINOPHEN 10 MG/ML IV SOLN: 1000.0000 mg | Freq: Once | INTRAVENOUS | Status: DC | PRN

## 2024-11-17 MED ORDER — ONDANSETRON HCL 4 MG/2ML IJ SOLN
INTRAMUSCULAR | Status: DC | PRN
  Administered 2024-11-17: 4 mg via INTRAVENOUS

## 2024-11-17 MED ORDER — OXYCODONE-ACETAMINOPHEN 5-325 MG PO TABS: 1.0000 | ORAL_TABLET | Freq: Four times a day (QID) | ORAL | 0 refills | Status: AC | PRN

## 2024-11-17 MED ORDER — LIDOCAINE HCL (CARDIAC) PF 100 MG/5ML IV SOSY
PREFILLED_SYRINGE | INTRAVENOUS | Status: DC | PRN
  Administered 2024-11-17: 80 mg via INTRAVENOUS

## 2024-11-17 MED ORDER — KETOROLAC TROMETHAMINE 10 MG PO TABS: 10.0000 mg | ORAL_TABLET | Freq: Three times a day (TID) | ORAL | 0 refills | Status: AC | PRN

## 2024-11-17 MED ORDER — OXYCODONE HCL 5 MG PO TABS
5.0000 mg | ORAL_TABLET | Freq: Once | ORAL | Status: AC | PRN
  Administered 2024-11-17: 5 mg via ORAL

## 2024-11-17 MED ORDER — ONDANSETRON HCL 4 MG/2ML IJ SOLN
4.0000 mg | Freq: Once | INTRAMUSCULAR | Status: AC | PRN
  Administered 2024-11-17: 4 mg via INTRAVENOUS

## 2024-11-17 MED ORDER — PROPOFOL 10 MG/ML IV BOLUS
INTRAVENOUS | Status: AC
  Filled 2024-11-17: qty 20

## 2024-11-17 SURGICAL SUPPLY — 20 items
BAG URO CATCHER STRL LF (MISCELLANEOUS) ×1 IMPLANT
BASKET LASER NITINOL 1.9FR (BASKET) IMPLANT
BASKET STONE NCOMPASS (UROLOGICAL SUPPLIES) IMPLANT
CATH URETL OPEN END 6FR 70 (CATHETERS) ×1 IMPLANT
CLOTH BEACON ORANGE TIMEOUT ST (SAFETY) ×1 IMPLANT
GLOVE SURG LX STRL 7.5 STRW (GLOVE) ×1 IMPLANT
GOWN STRL REUS W/ TWL XL LVL3 (GOWN DISPOSABLE) ×1 IMPLANT
GUIDEWIRE ANG ZIPWIRE 038X150 (WIRE) ×1 IMPLANT
GUIDEWIRE STR DUAL SENSOR (WIRE) ×1 IMPLANT
KIT TURNOVER KIT A (KITS) ×1 IMPLANT
MANIFOLD NEPTUNE II (INSTRUMENTS) ×1 IMPLANT
NS IRRIG 1000ML POUR BTL (IV SOLUTION) IMPLANT
PACK CYSTO (CUSTOM PROCEDURE TRAY) ×1 IMPLANT
SHEATH NAVIGATOR HD 11/13X28 (SHEATH) IMPLANT
SHEATH NAVIGATOR HD 11/13X36 (SHEATH) IMPLANT
STENT POLARIS 5FRX24 (STENTS) IMPLANT
TRACTIP FLEXIVA PULS ID 200XHI (Laser) IMPLANT
TUBE PU 8FR 16IN ENFIT (TUBING) ×1 IMPLANT
TUBING CONNECTING 10 (TUBING) ×1 IMPLANT
TUBING UROLOGY SET (TUBING) ×1 IMPLANT

## 2024-11-17 NOTE — Anesthesia Postprocedure Evaluation (Signed)
"   Anesthesia Post Note  Patient: Hannah Nguyen  Procedure(s) Performed: CYSTOSCOPY/URETEROSCOPY/HOLMIUM LASER/STENT PLACEMENT (Right) CYSTOSCOPY, WITH RETROGRADE PYELOGRAM (Right)     Patient location during evaluation: PACU Anesthesia Type: General Level of consciousness: awake and alert Pain management: pain level controlled Vital Signs Assessment: post-procedure vital signs reviewed and stable Respiratory status: spontaneous breathing, nonlabored ventilation, respiratory function stable and patient connected to nasal cannula oxygen Cardiovascular status: blood pressure returned to baseline and stable Postop Assessment: no apparent nausea or vomiting Anesthetic complications: no   No notable events documented.  Last Vitals:  Vitals:   11/17/24 1245 11/17/24 1300  BP: 122/67 130/86  Pulse: 72 65  Resp: 10   Temp:    SpO2: 100% 100%    Last Pain:  Vitals:   11/17/24 1300  TempSrc:   PainSc: 2                  Hannah Nguyen      "

## 2024-11-17 NOTE — Brief Op Note (Signed)
 11/17/2024  11:26 AM  PATIENT:  Hannah Nguyen  63 y.o. female  PRE-OPERATIVE DIAGNOSIS:  RIGHT KIDNEY STONE  POST-OPERATIVE DIAGNOSIS:  RIGHT KIDNEY STONE  PROCEDURE:  Procedures: CYSTOSCOPY/URETEROSCOPY/HOLMIUM LASER/STENT PLACEMENT (Right) CYSTOSCOPY, WITH RETROGRADE PYELOGRAM (Right)  SURGEON:  Surgeons and Role:    * Manny, Ricardo KATHEE Raddle., MD - Primary  PHYSICIAN ASSISTANT:   ASSISTANTS: none   ANESTHESIA:   general  EBL:  minimal   BLOOD ADMINISTERED:none  DRAINS: none   LOCAL MEDICATIONS USED:  NONE  SPECIMEN:  Source of Specimen:  Rt renal stone fragments  DISPOSITION OF SPECIMEN:  [facility] for compositional analysis  COUNTS:  YES  TOURNIQUET:  * No tourniquets in log *  DICTATION: .Other Dictation: Dictation Number [id number]  PLAN OF CARE: Discharge to home after PACU  PATIENT DISPOSITION:  PACU - hemodynamically stable.   Delay start of Pharmacological VTE agent (>24hrs) due to surgical blood loss or risk of bleeding: yes

## 2024-11-17 NOTE — Discharge Instructions (Addendum)
1 - You may have urinary urgency (bladder spasms) and bloody urine on / off with stent in place. This is normal. ° °2 - Remove tethered stent on Friday morning at home by pulling on string, then blue-white plastic tubing, and discarding. Office is open Friday if any problems arise.  ° °3 - Call MD or go to ER for fever >102, severe pain / nausea / vomiting not relieved by medications, or acute change in medical status ° °

## 2024-11-17 NOTE — H&P (Signed)
 Hannah Nguyen is an 63 y.o. female.    Chief Complaint: Pre-OP RIGHT Ureteroscopic Stone Manipulation  HPI:    1 - Recurrent Urolithiasis - Pre 2025 - MET x several, SWL 2012  Recent Course: 09/2024 CT - abtou 12mm Rt upper mid infundubular stone, no hydro.  PMH sig for seizure/migrane/kepproa (great control), fibroid excision, HTN. Works at [employer] over 20 years on [address] / [address]. Her PCP is [employer] PA with [facility].  Today  Hannah Nguyen is seen to proceed with RIGHT ureteroscopy for large infundibular stone with some likely intra-renal obstruction. Cr 0.82, most recent UA without significant infectious parameters. No interval fevers.   Past Medical History:  Diagnosis Date   Bartholin's gland cyst 10/22/2011   Elevated cholesterol    GERD (gastroesophageal reflux disease)    History of kidney stones    Hypertension    Kidney infection 05/19/2015   Leiomyoma of uterus 10/22/2011   Migraine    Nephrolithiasis    Neuromuscular disorder (HCC)    Other and unspecified hyperlipidemia    Seizures (HCC)    2 months ago was last episode of uncontrollable rt arm motions and passed out   Spinal stenosis 11/18/2014   L4-L5, L leg pain    Past Surgical History:  Procedure Laterality Date   ABDOMINAL HYSTERECTOMY     CESAREAN SECTION     x2   EYE SURGERY     cataracts, both eyes    KNEE ARTHROSCOPY     x2   LITHOTRIPSY     ROBOTIC ASSISTED LAP VAGINAL HYSTERECTOMY  10/25/2011   Procedure: ROBOTIC ASSISTED LAPAROSCOPIC VAGINAL HYSTERECTOMY;  Surgeon: Olam DELENA Mill, MD;  Location: [facility];  Service: Gynecology;  Laterality: N/A;  Marsupralization og bartholin gland, cyst biopsy   thumb surg      Family History  Problem Relation Age of Onset   Diabetes Mother    Hyperlipidemia Mother    Hypertension Mother    Breast cancer Mother 96       s/p bilateral mastectomy   Diabetes Father    Hyperlipidemia Father    Hypertension Father    Hypertension Sister     Bipolar disorder Daughter    Asthma Daughter    Diabetes Daughter    Bipolar disorder Son    Social History:  reports that she has never smoked. She has never used smokeless tobacco. She reports that she does not drink alcohol and does not use drugs.  Allergies: Allergies[1]  No medications prior to admission.    No results found for this or any previous visit (from the past 48 hours). No results found.  Review of Systems  Constitutional:  Negative for chills and fever.  Genitourinary:  Positive for flank pain.  All other systems reviewed and are negative.   Last menstrual period 09/19/2011. Physical Exam Vitals reviewed.  HENT:     Head: Normocephalic.  Eyes:     Pupils: Pupils are equal, round, and reactive to light.  Cardiovascular:     Rate and Rhythm: Normal rate.  Abdominal:     Comments: Stable truncal obesity  Genitourinary:    Comments: Mild Rt CVAT Musculoskeletal:        General: Normal range of motion.     Cervical back: Normal range of motion.  Skin:    General: Skin is warm.  Neurological:     General: No focal deficit present.     Mental Status: She is alert.  Psychiatric:  Mood and Affect: Mood normal.      Assessment/Plan  Proceed as planned with RIGHT ureteroscopic stone manipulation. Risks, benefits, alternatives, expected peri-op course discussed previously and reiterated today.  Ricardo KATHEE Alvaro Mickey., MD 11/17/2024, 6:33 AM       [1]  Allergies Allergen Reactions   Buspirone Other (See Comments)    headache   Vicodin [Hydrocodone-Acetaminophen ] Hives and Itching

## 2024-11-17 NOTE — Progress Notes (Signed)
 WORK EXCUSE:  To whom it concerns   Please excuse Hannah Nguyen from work until 11/22/2024 after outpatient surgery and recovery. She can return on 1/5 without restrictions.   Sincerely,  Rosalynn Likens MD

## 2024-11-17 NOTE — Op Note (Unsigned)
 NAME: Hannah Nguyen, Hannah Nguyen MEDICAL RECORD NO: [id number] ACCOUNT NO: [id number] DATE OF BIRTH: 10-May-1961 FACILITY: [facility] LOCATION: [facility] PHYSICIAN: Ricardo Likens, MD  Operative Report   DATE OF PROCEDURE: 11/17/2024  SURGEON: Ricardo Likens, MD  PREOPERATIVE DIAGNOSIS:  Right renal stone, possible intrarenal obstruction.  POSTOPERATIVE DIAGNOSIS:  Right renal stone, possible intrarenal obstruction.  PROCEDURE PERFORMED: 1. Cystoscopy, right retrograde pyelogram interpretation. 2. Right ureteroscopy with laser lithotripsy. 3. Placement of a right ureteral stent.  ESTIMATED BLOOD LOSS:  Nil.  COMPLICATIONS:  None.  SPECIMENS:  Right renal stone fragments for composition analysis.  FINDINGS: 1. Right lower mid infundibular stone as anticipated, approximately 1 cm. 2. Complete resolution of all accessible stone fragments larger than 1/3rd mm following laser lithotripsy and basket extraction. 3. Successful placement of a right ureteral stent, proximal end in the renal pelvis, distal end in the urinary bladder.  INDICATION:  The patient is a very pleasant 63 year old lady who was found on workup for occasional flank pain to have an approximate 1 cm fusiform right lower mid renal stone.  This appeared to be possibly intermittently obstructing a lower pole  infundibulum.  Options discussed for management including observation versus shockwave lithotripsy versus ureteroscopy.  Given the relatively lower pole location, we agreed on ureteroscopy with the goal of stone free being optimal.  She presents for this  today.  Informed consent was obtained and placed in the medical record.  DESCRIPTION OF PROCEDURE:  The patient was identified and verified, the procedure being right ureteroscopic stone manipulation, was confirmed.  Procedure timeout was performed.  Intravenous antibiotics were administered.  General LMA anesthesia was  introduced.  The patient was placed into a low lithotomy  position.  Sterile field was created.  Prepping and draping the patient's vagina, introitus, and proximal thigh using iodine.  Cystourethroscopy was performed using a 21-French rigid cystoscope with offset lens.  Inspection of the urinary bladder revealed no diverticula, calcifications, or papillary lesions.  The right ureteral orifice was cannulated with a 6-French end-hole  catheter and a right retrograde pyelogram was obtained.  Right retrograde pyelogram demonstrated single right ureter, single system right kidney.  No obvious filling defects or narrowing were noted at this point.  A 0.038 ZIPwire was advanced to the lower pole and set aside as a safety wire.  An 8-French  feeding tube was placed in the urinary bladder for pressure release.  Semirigid ureteroscopy was then performed in the distal 4/5th of the right ureter alongside a separate sensory working wire.  No mucosal abnormalities were found.  The semirigid scope was then exchanged for a short length ureteral access sheath, which  was placed sequentially to the level of the proximal ureter using continuous fluoroscopic guidance.  Flexible digital ureteroscopy was then performed in the proximal ureter and systematic inspection of the right kidney, including all calyces x2.  As anticipated, there was a rather fusiform stone in the lower pole infundibulum.  It was not acutely  obstructing at this point, but the geometry did favor some possible intermittent intrarenal obstruction.  The stone was much too large for simple basketing.  As such, Holmium laser energy was applied to the stone using a setting of 0.2 joules and 30 Hz  and using a dusting technique approximately 70% of the stone was dusted with the remaining 30% fragmented, with the fragments being amenable to basketing with a combination of the Escape basket and the Encompass basket.  This was performed until all  fragments larger than 1/3rd  mm had been removed.  Excellent hemostasis  was achieved.  We achieved the goal of the surgery today.  The access sheath was removed under continuous vision.  No significant mucosal abnormalities were found.  Given the relatively large stone volume and dusting technique as well that interval stenting would be warranted but a tethered stent would suffice.  As such, a new 5 x 24 Polaris type stent was carefully placed over the safety wire using fluoroscopic  guidance.  Good proximal and distal plane were noted.  The tether was left in place, trimmed to length, tucked per vagina, and the procedure was terminated.  The patient tolerated the procedure well.  No immediate periprocedural complications.  The  patient was taken to the postanesthesia care unit in stable condition.  The plan is for discharge home.   PUS D: 11/17/2024 11:31:17 am T: 11/17/2024 1:30:00 pm  JOB: [id number]/ [id number]

## 2024-11-17 NOTE — Anesthesia Procedure Notes (Signed)
 Procedure Name: LMA Insertion Date/Time: 11/17/2024 10:43 AM  Performed by: Cena Epps, CRNAPre-anesthesia Checklist: Patient identified, Emergency Drugs available, Suction available and Patient being monitored Patient Re-evaluated:Patient Re-evaluated prior to induction Oxygen Delivery Method: Circle System Utilized Preoxygenation: Pre-oxygenation with 100% oxygen Induction Type: IV induction Ventilation: Mask ventilation without difficulty LMA: LMA inserted LMA Size: 4.0 Number of attempts: 1 Airway Equipment and Method: Bite block Placement Confirmation: positive ETCO2 Tube secured with: Tape Dental Injury: Teeth and Oropharynx as per pre-operative assessment

## 2024-11-17 NOTE — Transfer of Care (Signed)
 Immediate Anesthesia Transfer of Care Note  Patient: Hannah Nguyen  Procedure(s) Performed: CYSTOSCOPY/URETEROSCOPY/HOLMIUM LASER/STENT PLACEMENT (Right) CYSTOSCOPY, WITH RETROGRADE PYELOGRAM (Right)  Patient Location: PACU  Anesthesia Type:General  Level of Consciousness: awake, alert , oriented, and patient cooperative  Airway & Oxygen Therapy: Patient Spontanous Breathing and Patient connected to face mask oxygen  Post-op Assessment: Report given to RN and Post -op Vital signs reviewed and stable  Post vital signs: Reviewed and stable  Last Vitals:  Vitals Value Taken Time  BP 133/74 11/17/24 11:45  Temp    Pulse 73 11/17/24 11:45  Resp 17 11/17/24 11:45  SpO2 100 % 11/17/24 11:45  Vitals shown include unfiled device data.  Last Pain:  Vitals:   11/17/24 1000  TempSrc:   PainSc: 5          Complications: No notable events documented.

## 2024-11-18 ENCOUNTER — Encounter (HOSPITAL_COMMUNITY): Payer: Self-pay | Admitting: Urology

## 2024-12-27 ENCOUNTER — Ambulatory Visit: Admitting: Podiatry

## 2025-01-03 ENCOUNTER — Ambulatory Visit: Admitting: Physical Therapy
